# Patient Record
Sex: Female | Born: 1985 | Race: White | Hispanic: No | Marital: Married | Smoking: Former smoker
Health system: Southern US, Community
[De-identification: ages and names within clinical notes are randomized; demographics above are authoritative.]

## PROBLEM LIST (undated history)

## (undated) ENCOUNTER — Inpatient Hospital Stay (HOSPITAL_COMMUNITY): Payer: Self-pay

## (undated) DIAGNOSIS — R12 Heartburn: Secondary | ICD-10-CM

## (undated) DIAGNOSIS — Z8619 Personal history of other infectious and parasitic diseases: Secondary | ICD-10-CM

## (undated) DIAGNOSIS — E119 Type 2 diabetes mellitus without complications: Secondary | ICD-10-CM

## (undated) DIAGNOSIS — G43909 Migraine, unspecified, not intractable, without status migrainosus: Secondary | ICD-10-CM

## (undated) DIAGNOSIS — O26899 Other specified pregnancy related conditions, unspecified trimester: Secondary | ICD-10-CM

## (undated) DIAGNOSIS — B999 Unspecified infectious disease: Secondary | ICD-10-CM

## (undated) HISTORY — DX: Migraine, unspecified, not intractable, without status migrainosus: G43.909

## (undated) HISTORY — PX: DENTAL SURGERY: SHX609

## (undated) HISTORY — DX: Personal history of other infectious and parasitic diseases: Z86.19

---
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
## 2007-08-21 ENCOUNTER — Inpatient Hospital Stay (HOSPITAL_COMMUNITY): Admission: AD | Admit: 2007-08-21 | Discharge: 2007-08-21 | Payer: Self-pay | Admitting: Obstetrics & Gynecology

## 2007-08-29 ENCOUNTER — Emergency Department (HOSPITAL_COMMUNITY): Admission: EM | Admit: 2007-08-29 | Discharge: 2007-08-29 | Payer: Self-pay | Admitting: Emergency Medicine

## 2007-09-22 ENCOUNTER — Other Ambulatory Visit: Admission: RE | Admit: 2007-09-22 | Discharge: 2007-09-22 | Payer: Self-pay | Admitting: Obstetrics & Gynecology

## 2007-11-05 ENCOUNTER — Ambulatory Visit (HOSPITAL_COMMUNITY): Admission: RE | Admit: 2007-11-05 | Discharge: 2007-11-05 | Payer: Self-pay | Admitting: Obstetrics and Gynecology

## 2007-12-03 ENCOUNTER — Ambulatory Visit (HOSPITAL_COMMUNITY): Admission: RE | Admit: 2007-12-03 | Discharge: 2007-12-03 | Payer: Self-pay | Admitting: Obstetrics and Gynecology

## 2007-12-31 ENCOUNTER — Ambulatory Visit (HOSPITAL_COMMUNITY): Admission: RE | Admit: 2007-12-31 | Discharge: 2007-12-31 | Payer: Self-pay | Admitting: Obstetrics and Gynecology

## 2008-03-12 ENCOUNTER — Ambulatory Visit: Payer: Self-pay | Admitting: Obstetrics & Gynecology

## 2008-03-12 ENCOUNTER — Inpatient Hospital Stay (HOSPITAL_COMMUNITY): Admission: AD | Admit: 2008-03-12 | Discharge: 2008-03-12 | Payer: Self-pay | Admitting: Obstetrics & Gynecology

## 2008-03-18 ENCOUNTER — Observation Stay (HOSPITAL_COMMUNITY): Admission: AD | Admit: 2008-03-18 | Discharge: 2008-03-18 | Payer: Self-pay | Admitting: Obstetrics and Gynecology

## 2008-03-18 ENCOUNTER — Ambulatory Visit: Payer: Self-pay | Admitting: Obstetrics & Gynecology

## 2008-04-01 ENCOUNTER — Inpatient Hospital Stay (HOSPITAL_COMMUNITY): Admission: RE | Admit: 2008-04-01 | Discharge: 2008-04-03 | Payer: Self-pay | Admitting: Obstetrics and Gynecology

## 2008-04-01 ENCOUNTER — Encounter: Payer: Self-pay | Admitting: Obstetrics & Gynecology

## 2008-05-19 ENCOUNTER — Emergency Department (HOSPITAL_COMMUNITY): Admission: EM | Admit: 2008-05-19 | Discharge: 2008-05-19 | Payer: Self-pay | Admitting: Emergency Medicine

## 2008-11-08 ENCOUNTER — Other Ambulatory Visit: Admission: RE | Admit: 2008-11-08 | Discharge: 2008-11-08 | Payer: Self-pay | Admitting: Obstetrics and Gynecology

## 2009-06-10 ENCOUNTER — Emergency Department (HOSPITAL_COMMUNITY): Admission: EM | Admit: 2009-06-10 | Discharge: 2009-06-10 | Payer: Self-pay | Admitting: Emergency Medicine

## 2010-02-21 ENCOUNTER — Encounter: Payer: Self-pay | Admitting: Endocrinology

## 2010-02-22 ENCOUNTER — Encounter: Payer: Self-pay | Admitting: Endocrinology

## 2010-05-26 ENCOUNTER — Encounter: Payer: Self-pay | Admitting: Endocrinology

## 2010-08-16 ENCOUNTER — Emergency Department (HOSPITAL_COMMUNITY): Admission: EM | Admit: 2010-08-16 | Discharge: 2010-08-16 | Payer: Self-pay | Admitting: Emergency Medicine

## 2010-08-29 ENCOUNTER — Encounter: Payer: Self-pay | Admitting: Endocrinology

## 2010-09-05 ENCOUNTER — Encounter: Payer: Self-pay | Admitting: Endocrinology

## 2010-09-07 ENCOUNTER — Ambulatory Visit: Payer: Self-pay | Admitting: Endocrinology

## 2010-09-07 DIAGNOSIS — E109 Type 1 diabetes mellitus without complications: Secondary | ICD-10-CM | POA: Insufficient documentation

## 2010-10-05 ENCOUNTER — Ambulatory Visit: Payer: Self-pay | Admitting: Endocrinology

## 2010-11-21 NOTE — Letter (Signed)
Summary: [facility] [place]   Imported By: Lester Moniteau 09/12/2010 10:16:21  _____________________________________________________________________  External Attachment:    Type:   Image     Comment:   External Document

## 2010-11-21 NOTE — Assessment & Plan Note (Signed)
Summary: NEW ENDO MEDICAID CAR ACC 11% A1C - POOR CONTR DIAB PT-PER JA...   Vital Signs:  Patient profile:   26 year old female Height:      64 inches (162.56 cm) Weight:      167.50 pounds (76.14 kg) BMI:     28.86 O2 Sat:      98 % on Room air Temp:     98.5 degrees F (36.94 degrees C) oral Pulse rate:   84 / minute BP sitting:   110 / 70  (left arm) Cuff size:   regular  Vitals Entered By: Brenton Grills CMA Duncan Dull) (September 07, 2010 11:17 AM)  O2 Flow:  Room air CC: New Endo Consult/Uncontrolled DM/aj Is Patient Diabetic? Yes   Referring Provider:  Jackalyn Lombard MD Primary Provider:  Jackalyn Lombard MD  CC:  New Endo Consult/Uncontrolled DM/aj.  History of Present Illness: gdm was first noted during 2009 pregnancy.  she did not take any rx for the dm during the pregnancy, and neonate was 9 lbs 11 oz.  dm did not resolve after delivery.  she is unaware of any chronic complications.  she has never been on insulin.  she takes no medication now, but is due to staft metformin 500 mg qd, and glipizide at an uncertain dosage.  no cbg record, but states cbg's are 200-400. pt says his diet and exercise are "not good."   symptomatically, pt states she has few years of intermittent moderate dizziness sensation in the head.  no assoc loc. she is not considering another pregnancy.     Current Medications (verified): 1)  None  Allergies (verified): No Known Drug Allergies  Past History:  Past Medical History: IDDM (ICD-250.01) FAMILY HISTORY DIABETES 1ST DEGREE RELATIVE (ICD-V18.0)  Family History: Reviewed history and no changes required. Family History of Arthritis Family History Diabetes--both parents Family History High cholesterol (Parent) Family History Hypertension (Parents) Family History of Heart Disease (Grandparent) Family History Ovarian cancer (Parents, Other Blood Relative)  Social History: Reviewed history and no changes required. Married 1  child works at [employer] Former Smoker Alcohol use-no Drug use-no Regular exercise-no Smoking Status:  quit Drug Use:  no Does Patient Exercise:  no Seat Belt Use:  yes  Review of Systems       The patient complains of headaches.         denies weight loss, blurry vision, sob, vomiting, cramps, memory loss, depression, hypoglycemia, rhinorrhea, and easy bruising.  chest pain has been attributed to gerd.  she has polyuria, and excessive diaphoresis.   Physical Exam  General:  normal appearance.   Head:  head: no deformity eyes: no periorbital swelling, no proptosis external nose and ears are normal mouth: no lesion seen Neck:  Supple without thyroid enlargement or tenderness.  Lungs:  Clear to auscultation bilaterally. Normal respiratory effort.  Heart:  Regular rate and rhythm without murmurs or gallops noted. Normal S1,S2.   Abdomen:  abdomen is soft, nontender.  no hepatosplenomegaly.   not distended.  no hernia  Msk:  muscle bulk and strength are grossly normal.  no obvious joint swelling.  gait is normal and steady  Pulses:  dorsalis pedis intact bilat.  no carotid bruit  Extremities:  no deformity.  no ulcer on the feet.  feet are of normal color and temp.  no edema  Neurologic:  cn 2-12 grossly intact.   readily moves all 4's.   sensation is intact to touch on the feet  Skin:  normal texture and temp.  no rash.  not diaphoretic  Cervical Nodes:  No significant adenopathy.  Psych:  Alert and cooperative; normal mood and affect; normal attention span and concentration.   Additional Exam:  outside test results are reviewed:  a1c=11.8%  urine microalbumin is pos   Impression & Recommendations:  Problem # 1:  IDDM (ICD-250.01) needs increased rx  Problem # 2:  dizziness this can limit exercise rx  Problem # 3:  contraception it is important that pt continue this in view of her elev a1c  Medications Added to Medication List This Visit: 1)   Depo-provera 150 Mg/ml Susp (Medroxyprogesterone acetate) .... Q 3 months  Other Orders: New Patient Level IV (16109)  Patient Instructions: 1)  good diet and exercise habits significanly improve the control of your diabetes.  please let me know if you wish to be referred to a dietician.  high blood sugar is very risky to your health.  you should see an eye doctor every year. 2)  controlling your blood pressure and cholesterol drastically reduces the damage diabetes does to your body.  this also applies to quitting smoking.  please discuss these with your doctor.   3)  check your blood sugar 2 times a day.  vary the time of day when you check, between before the 3 meals, and at bedtime.  also check if you have symptoms of your blood sugar being too high or too low.  please keep a record of the readings and bring it to your next appointment here.  please call us sooner if you are having low blood sugar episodes. 4)  you should not have another pregnancy until advised it is safe.   5)  we will need to take this complex situation in stages. 6)  start the 2 diabetes medications as prescribed.  call in 4 days if blood sugar does not improve to the low-100's, and i will add another medication.   7)  Please schedule a follow-up appointment in 4 weeks.   Orders Added: 1)  New Patient Level IV [60454]

## 2011-01-03 LAB — DIFFERENTIAL
Basophils Absolute: 0 10*3/uL (ref 0.0–0.1)
Eosinophils Absolute: 0 10*3/uL (ref 0.0–0.7)
Eosinophils Relative: 0 % (ref 0–5)
Lymphocytes Relative: 40 % (ref 12–46)
Lymphs Abs: 3.5 10*3/uL (ref 0.7–4.0)
Monocytes Absolute: 0.7 10*3/uL (ref 0.1–1.0)

## 2011-01-03 LAB — COMPREHENSIVE METABOLIC PANEL
ALT: 20 U/L (ref 0–35)
AST: 17 U/L (ref 0–37)
Albumin: 4 g/dL (ref 3.5–5.2)
CO2: 23 mEq/L (ref 19–32)
Chloride: 103 mEq/L (ref 96–112)
GFR calc Af Amer: >60 mL/min (ref >60)
GFR calc non Af Amer: >60 mL/min (ref >60)
Sodium: 136 mEq/L (ref 135–145)
Total Bilirubin: 0.2 mg/dL — ABNORMAL LOW (ref 0.3–1.2)

## 2011-01-03 LAB — CBC
HCT: 39.9 % (ref 36.0–46.0)
Hemoglobin: 13.8 g/dL (ref 12.0–15.0)
MCH: 29.9 pg (ref 26.0–34.0)
MCV: 86.8 fL (ref 78.0–100.0)
RBC: 4.6 MIL/uL (ref 3.87–5.11)
WBC: 8.9 10*3/uL (ref 4.0–10.5)

## 2011-01-03 LAB — CK TOTAL AND CKMB (NOT AT ARMC): CK, MB: 0.4 ng/mL (ref 0.3–4.0)

## 2011-01-03 LAB — D-DIMER, QUANTITATIVE: D-Dimer, Quant: 1.13 ug/mL-FEU — ABNORMAL HIGH (ref 0.00–0.48)

## 2011-01-17 ENCOUNTER — Emergency Department (HOSPITAL_COMMUNITY)
Admission: EM | Admit: 2011-01-17 | Discharge: 2011-01-17 | Disposition: A | Discharge status: Home / Self Care | Payer: Medicaid Other | Attending: Emergency Medicine | Admitting: Emergency Medicine

## 2011-01-17 ENCOUNTER — Emergency Department (HOSPITAL_COMMUNITY): Payer: Medicaid Other

## 2011-01-17 DIAGNOSIS — N898 Other specified noninflammatory disorders of vagina: Secondary | ICD-10-CM | POA: Insufficient documentation

## 2011-01-17 DIAGNOSIS — E119 Type 2 diabetes mellitus without complications: Secondary | ICD-10-CM | POA: Insufficient documentation

## 2011-01-17 DIAGNOSIS — R10819 Abdominal tenderness, unspecified site: Secondary | ICD-10-CM | POA: Insufficient documentation

## 2011-01-17 DIAGNOSIS — R109 Unspecified abdominal pain: Secondary | ICD-10-CM | POA: Insufficient documentation

## 2011-01-17 DIAGNOSIS — N949 Unspecified condition associated with female genital organs and menstrual cycle: Secondary | ICD-10-CM | POA: Insufficient documentation

## 2011-01-17 LAB — URINALYSIS, ROUTINE W REFLEX MICROSCOPIC
Glucose, UA: >1000 mg/dL — AB
Leukocytes, UA: NEGATIVE
Protein, ur: 30 mg/dL — AB
pH: 5 (ref 5.0–8.0)

## 2011-01-17 LAB — URINE MICROSCOPIC-ADD ON

## 2011-01-17 LAB — WET PREP, GENITAL
Trich, Wet Prep: NONE SEEN
Yeast Wet Prep HPF POC: NONE SEEN

## 2011-03-06 NOTE — Op Note (Signed)
NAMEMarland [name]  KATHREN, SCEARCE              ACCOUNT NO.:  [id number]   MEDICAL RECORD NO.:  [id number]          PATIENT TYPE:  INP   LOCATION:  9103                          FACILITY:  [facility]   PHYSICIAN:  Lazaro Arms, M.D.   DATE OF BIRTH:  06/05/1986   DATE OF PROCEDURE:  04/01/2008  DATE OF DISCHARGE:                               OPERATIVE REPORT   PREOPERATIVE DIAGNOSES:  1. Intrauterine pregnancy at [redacted] weeks gestation.  2. Class A2 diabetes, on glyburide.  3. Estimated fetal weight of 4500 g.   POSTOPERATIVE DIAGNOSES:  1. Intrauterine pregnancy at [redacted] weeks gestation.  2. Class A2 diabetes, on glyburide.  3. Estimated fetal weight of 4500 g.   PROCEDURE:  Primary low transverse cesarean section.   SURGEON:  Lazaro Arms, MD   ANESTHESIA:  Spinal.   FINDINGS:  The patient is a gestational diabetic, requiring glyburide  for control, and we did an ultrasound yesterday and estimated fetal  weight was 4500 g.  I talked with Dr. Emelda Fear.  They called me in  consultation and I told him that my feeling was a diabetic, estimated  4500 g and that vaginal delivery was not appropriate, especially in  nulliparous who has not proven any sort of weight because of the 20% to  25% risk of shoulder distortion in this risk category.  We relayed that  to the patient and the patient agreed to proceed with a primary cesarean  section.  At the time of C-section, she delivered a viable female at 09:17  with Apgars of 9 and 9, and weighing 9 pounds 11 ounces.  There was 3-  vessel cord.  Cord blood and cord gases were sent.  Placenta was normal  intact.  Cord pH was 7.24.  Uterus, tubes, and ovaries were normal.   DESCRIPTION OF PROCEDURE:  The patient was taken to the operating room  and placed in the sitting position, where she underwent a spinal  anesthetic.  She was then prepped and draped in usual sterile fashion.  A Foley catheter was placed.  A Pfannenstiel skin incision was made and  carried  down sharply into the rectus fascia, which was scored in midline  and extended laterally.  The fascia was taken off the muscles superiorly  and inferiorly without difficulty, the muscles were divided.  Peritoneal  cavity was entered.  A bladder blade was placed.  Vesicouterine space  was clamped and flap was created.  A low transverse cesarean section  incision was made over this incision and delivered a viable female infant  at 09:17 with Apgars of 9 and 9, weighing 9 pounds 11 ounces.  Cord  blood and cord gases were sent.  The infant underwent routine neonatal  resuscitation.  The placenta was delivered spontaneously.  The uterus  was exteriorized, wiped and cleaned with a plain lap pad.  The uterus  was closed in 2 layers, first being in running interlocking layer with  the second being imbricating layer.  There was good hemostasis.  The  uterus was replaced in peritoneal cavity and the peritoneal cavity was  irrigated.  The muscles and peritoneum were reapproximated loosely.  The  fascia was closed using 0 Vicryl running subcutaneous tissue was  made hemostatic and irrigated.  The skin was closed using skin staples.  The patient tolerated the procedure well.  She experienced 500 mL of  blood loss, taken to recovery room in good stable condition.  All counts  were correct.  She received cefoxitin prophylactically.      Lazaro Arms, M.D.  Electronically Signed     LHE/MEDQ  D:  04/01/2008  T:  04/02/2008  Job:  [id number]

## 2011-03-06 NOTE — H&P (Signed)
NAME:  Evelyn [name], Evelyn [name] NO.:  [id number]   MEDICAL RECORD NO.:  [id number]           PATIENT TYPE:   LOCATION:                                 FACILITY:   PHYSICIAN:  Tilda Burrow, M.D. DATE OF BIRTH:  05-31-1986   DATE OF ADMISSION:  DATE OF DISCHARGE:                              HISTORY & PHYSICAL   ADMISSION DIAGNOSES:  1. Pregnancy at 39 weeks, 2 days.  2. Gestational diabetes.  3. Fetal macrosomia.  Estimated fetal weight greater than 4500 grams.   HISTORY OF PRESENT ILLNESS:  This 25 year old gravida 1, para 0, LMP  July 03, 2007, placing menstrual [facility] April 07, 2008, with ultrasound  confirming [facility] in early pregnancy, is admitted at this time for primary  Cesarean section.  Pregnancy course has been notable for elevated  glucose tolerance test with 3 hour glucose tolerance test results of 92,  187, 201 and 149 on March 30.  Patient has been poorly compliant with  monitoring her blood sugars until recently.  She was admitted earlier in  the pregnancy at 4 cm dilated at 37 weeks, but did not progress into  labor.  Contractions were never quite strong.  She was placed on  Glyburide 2.5 mg p.o. b.i.d. and agreed in the future to go to t.i.d.  blood glucose monitoring, which she was only moderately compliant with.  Fortunately, fasting blood sugars tend to run from 70 to 103 with  afternoon and bedtime blood sugars ranging occasionally as high as 170.  There have been no blood sugars greater than 170.  Estimated fetal  weight on March 31, 2008 is greater than 4,500 grams.  Cervix was 4 cm 2  days ago.  Patient was counseled over the options and given the size of  the baby, the option of proceeding with primary Cesarean delivery was  discussed.  The patient has considered her options and does not wish to  risk attempted vaginal delivery.  She is therefore scheduled for primary  Cesarean section at 9:00 a.m. on April 01, 2008.   PAST MEDICAL HISTORY:   Benign.   SURGICAL HISTORY:  Negative.   ALLERGIES:  1. Medications:  None.  2. Latex:  None.   MEDICATIONS:  Prenatal vitamins, iron and Glyburide 2.5 mg p.o. b.i.d.   GENERAL EXAMINATION:  Height 5 feet 4 inches.  Weight 200 pounds.  Pupils equal, round, reactive.  NECK:  Supple.  CARDIOVASCULAR:  Exam unremarkable.  Fundal height 42 cm.  Estimated fetal weight 4,504 grams by ultrasound.   PLAN:  Primary Cesarean section at 9:00 a.m. April 01, 2008.      Tilda Burrow, M.D.  Electronically Signed     JVF/MEDQ  D:  03/31/2008  T:  03/31/2008  Job:  [id number]

## 2011-03-06 NOTE — Discharge Summary (Signed)
NAMEMarland Kitchen  Evelyn, [name]              ACCOUNT NO.:  [id number]   MEDICAL RECORD NO.:  [id number]          PATIENT TYPE:  INP   LOCATION:  9103                          [facility]ACILITY:  [facility]   PHYSICIAN:  Allie Bossier, MD        DATE OF BIRTH:  10-08-1986   DATE OF ADMISSION:  04/01/2008  DATE OF DISCHARGE:  04/03/2008                               DISCHARGE SUMMARY   ADMISSION DIAGNOSES:  1. Intrauterine pregnancy at 35 weeks' gestation.  2. Class A2 gestational diabetes.  3. Estimated fetal weight greater than 4500 grams on recent ultrasound  4. Indicated primary low-transverse cesarean section for estimated      fetal weight greater than 4500 grams on March 31, 2008.   DISCHARGE DIAGNOSES:  1. Postoperative day #2 from primary low-transverse cesarean section.  2. Gestational diabetes type A2, blood sugars under good control      without medication.   PROCEDURES:  1. Primary low-transverse cesarean section at 46 weeks' gestation for      estimated fetal weight greater than 4500 grams on recent      ultrasound.  Viable infant female weighing 9 pounds 11 ounces, Apgars      9 at 1 minute and 9 at 5 minutes.   COMPLICATIONS:  None.   CONSULTATIONS:  None.   BRIEF PERTINENT LABORATORY FINDINGS:  On admission, she had a complete  blood cell count with white blood cells 13.1, hemoglobin 9.3, hematocrit  27.6, and platelets 367,000.  Her metabolic panel shows sodium 133,  potassium 3.6, chloride 106, glucose 83, BUN 3, and creatinine 0.38.  Blood type O+, antibody negative, RPR was nonreactive.  On postoperative  day #1, CBC showed white blood cell count 11.4, hemoglobin 8.0,  hematocrit 23.6, and platelets 289,000.  On postoperative day #2, white  blood cell count was 10.6, hemoglobin was 7.9, hematocrit was 23.6, and  platelets were 320,000.   BRIEF PERTINENT ADMISSION HISTORY:  Evelyn [name] is a 25 year old gravida  1, para 0 at 3 weeks' gestation and class A2 diabetes on glyburide  presenting for primary low-transverse cesarean section.  It was  indicated because estimated fetal weight was greater than 4500 grams on  March 31, 2008.  The patient was taken for low-transverse cesarean  section by Dr. Duane Lope.   HOSPITAL COURSE:  The patient tolerated the cesarean section, delivered  a viable infant female weighing 9 pounds 11 ounces on April 01, 2008.  It  was uncomplicated operation and the patient did well postoperatively.  Her hemoglobin did trend down from initial 9.3 to 7.9 postoperative day  #2.  She was doing well with stable vitals, normal physical examination.  Her incision was healing well with staples in place.  She had no  symptoms of anemia such as dizziness, lightheadedness, elevated pulse,  or decreased blood pressure.  She was stooling and voiding without  difficulty.  She was ambulating well and was tolerating p.o.  She was in  stable condition for discharge.   DISCHARGE STATUS:  Stable.   DISCHARGE MEDICATIONS:  1. Continue prenatal vitamins 1 tablet daily.  2. Colace 100 mg 1 tablet by mouth twice daily.  3. Ferrous sulfate 325 mg 1 tablet by mouth twice daily.  4. Ibuprofen 600 mg 1 tablet by mouth every 6 hours as needed for      pain.  5. Percocet 5/325 1-2 tablets by mouth every 4-6 hours as needed for      pain.  6. The patient is to receive Depo-Provera x1 dose prior to discharge.   DISCHARGE INSTRUCTIONS:  1. Discharge to home.  The patient is going to room in as the baby is      in the NICU.  2. No sexual activity for 6 weeks.  3. No lifting greater than 10 pounds for 6 weeks.  4. Follow up had already scheduled appointment for staple removal at      [facility].  5. Follow up at her 4-week postpartum examination.      Evelyn Lemon, MD  Electronically Signed     ______________________________  Allie Bossier, MD    NS/MEDQ  D:  04/03/2008  T:  04/04/2008  Job:  [id number]

## 2011-06-07 ENCOUNTER — Other Ambulatory Visit: Payer: Self-pay | Admitting: Family Medicine

## 2011-06-07 ENCOUNTER — Ambulatory Visit
Admission: RE | Admit: 2011-06-07 | Discharge: 2011-06-07 | Disposition: A | Discharge status: Home / Self Care | Payer: Medicaid Other | Source: Ambulatory Visit | Attending: Family Medicine | Admitting: Family Medicine

## 2011-06-07 DIAGNOSIS — S60221A Contusion of right hand, initial encounter: Secondary | ICD-10-CM

## 2011-07-18 LAB — CBC
HCT: 28.1 — ABNORMAL LOW
MCHC: 34
MCV: 83.3
RBC: 3.38 — ABNORMAL LOW

## 2011-07-18 LAB — URINALYSIS, ROUTINE W REFLEX MICROSCOPIC
Bilirubin Urine: NEGATIVE
Nitrite: NEGATIVE
Protein, ur: NEGATIVE
Specific Gravity, Urine: 1.025
Urobilinogen, UA: 0.2

## 2011-07-18 LAB — URINE MICROSCOPIC-ADD ON

## 2011-07-18 LAB — RPR: RPR Ser Ql: NONREACTIVE

## 2011-07-19 LAB — TYPE AND SCREEN: Antibody Screen: NEGATIVE

## 2011-07-19 LAB — BASIC METABOLIC PANEL
BUN: 3 — ABNORMAL LOW
CO2: 20
GFR calc non Af Amer: >60
Glucose, Bld: 83
Potassium: 3.6

## 2011-07-19 LAB — CBC
HCT: 23.6 — ABNORMAL LOW
HCT: 27.6 — ABNORMAL LOW
Hemoglobin: 8 — ABNORMAL LOW
Hemoglobin: 9.3 — ABNORMAL LOW
MCHC: 33.7
MCHC: 33.9
MCHC: 34
MCV: 81.2
MCV: 81.8
MCV: 81.8
Platelets: 320
Platelets: 367
RBC: 2.88 — ABNORMAL LOW
RDW: 15.2
RDW: 15.3
RDW: 15.3

## 2011-08-01 LAB — URINALYSIS, ROUTINE W REFLEX MICROSCOPIC
Ketones, ur: NEGATIVE
Protein, ur: NEGATIVE
Urobilinogen, UA: 0.2

## 2011-08-01 LAB — GC/CHLAMYDIA PROBE AMP, GENITAL: Chlamydia, DNA Probe: NEGATIVE

## 2011-08-01 LAB — CBC
HCT: 37.6
Hemoglobin: 12.9
Platelets: 360
RDW: 13.6
WBC: 11.1 — ABNORMAL HIGH

## 2011-08-01 LAB — WET PREP, GENITAL

## 2011-08-01 LAB — POCT PREGNANCY, URINE: Preg Test, Ur: POSITIVE

## 2011-08-02 IMAGING — US US PELVIS COMPLETE
1 series · 14 of 25 positions shown · non-contrast
Comparison: None.

CLINICAL DATA: Left lower quadrant pain



[Series 1: us pelvis complete · 0.19mm/px · 14 of 63 slices shown]
[im 1/63]
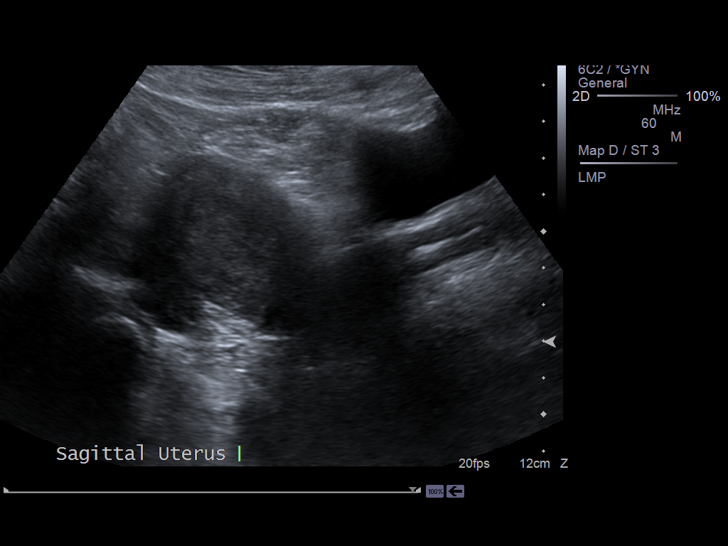
[im 6/63]
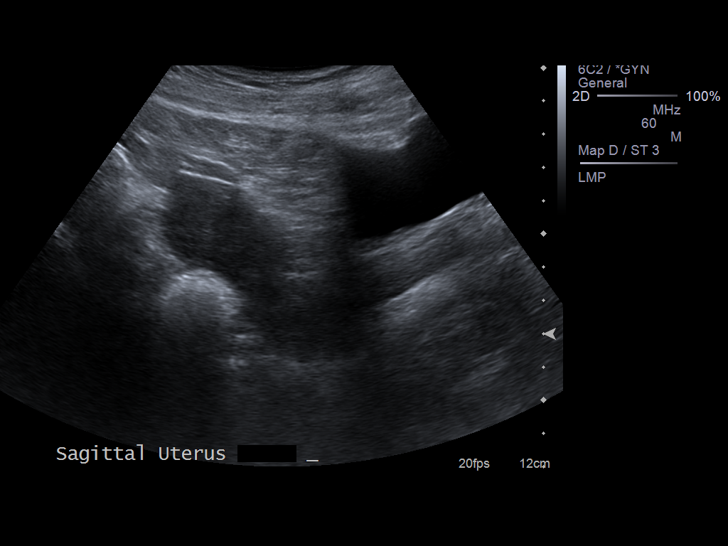
[im 11/63]
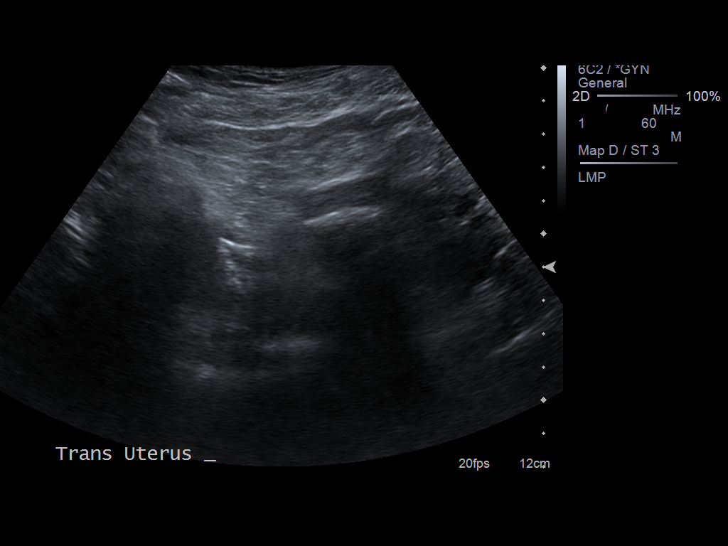
[im 16/63]
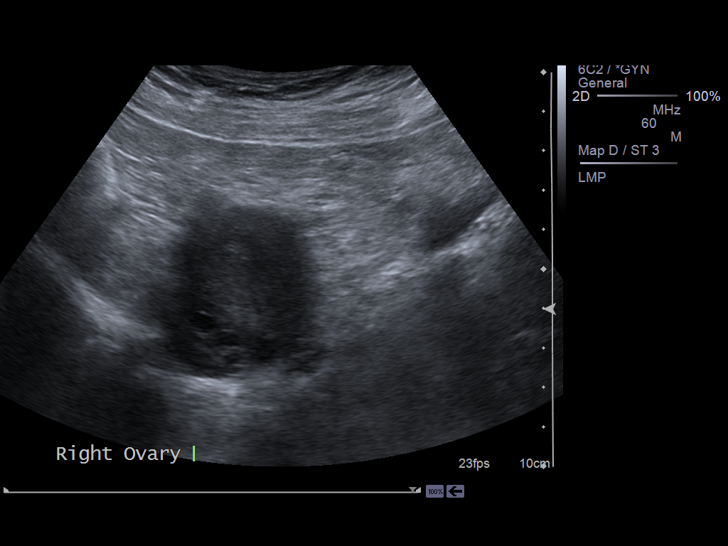
[im 21/63]
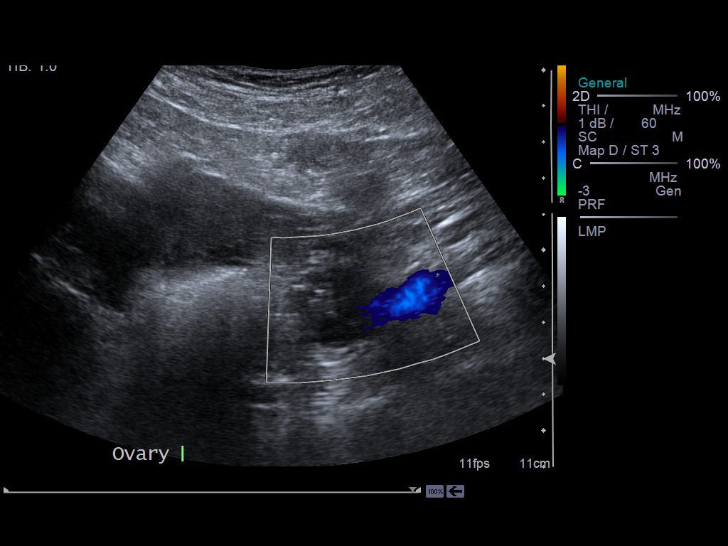
[im 24/63]
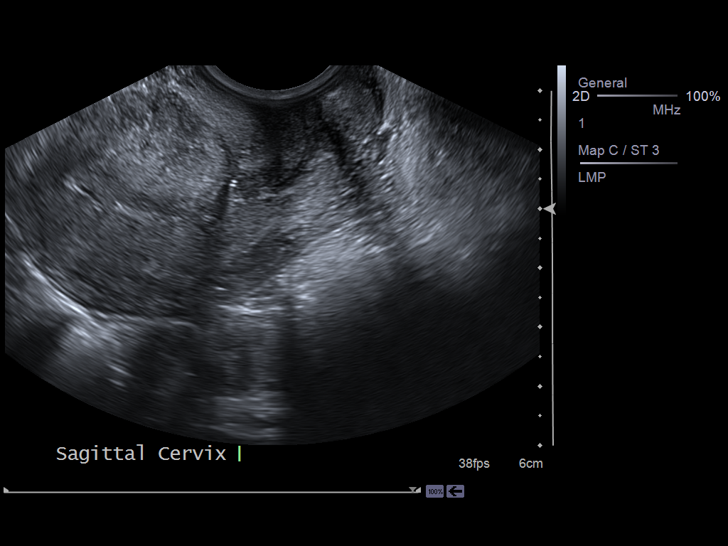
[im 29/63]
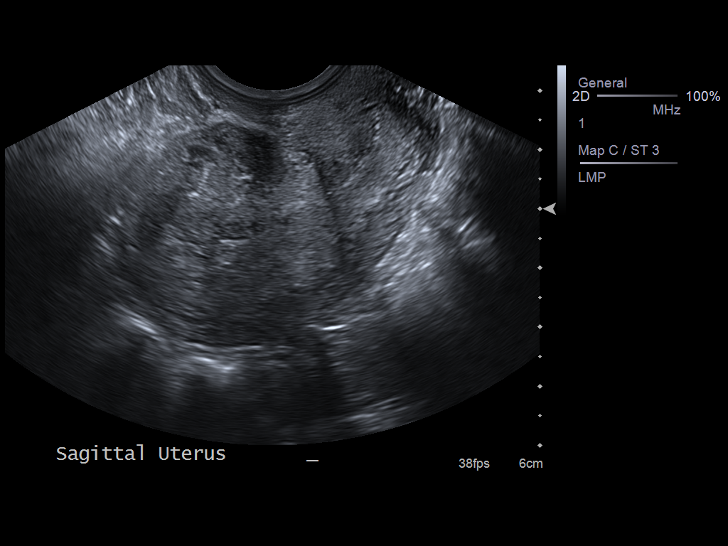
[im 34/63]
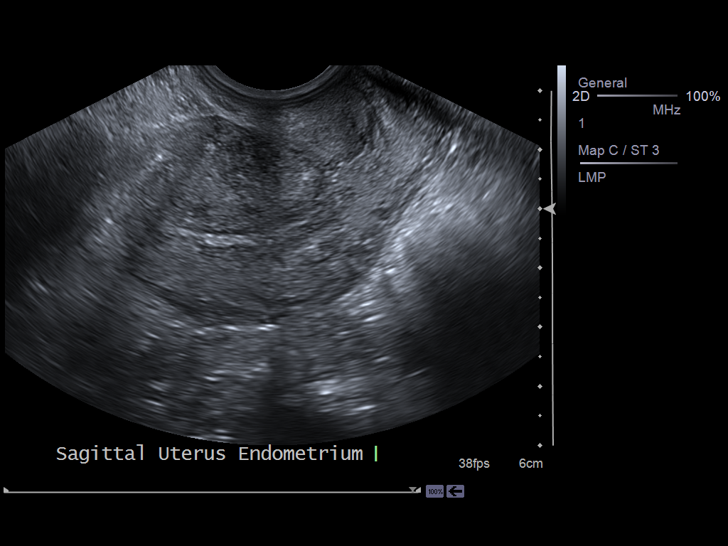
[im 39/63]
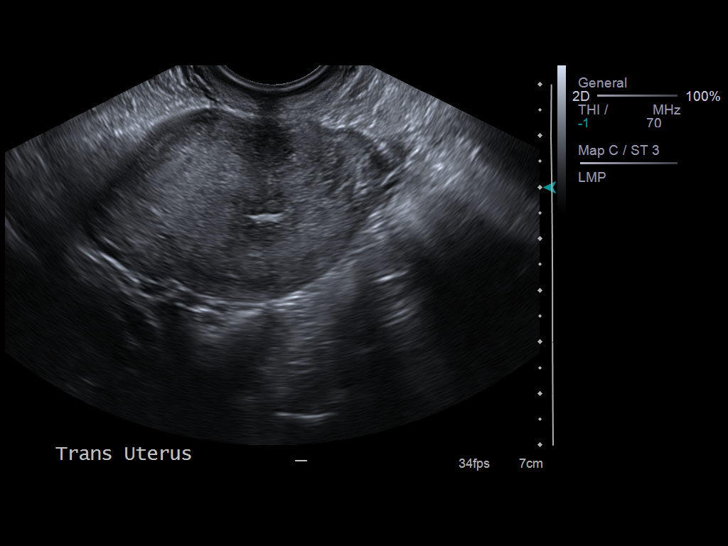
[im 42/63]
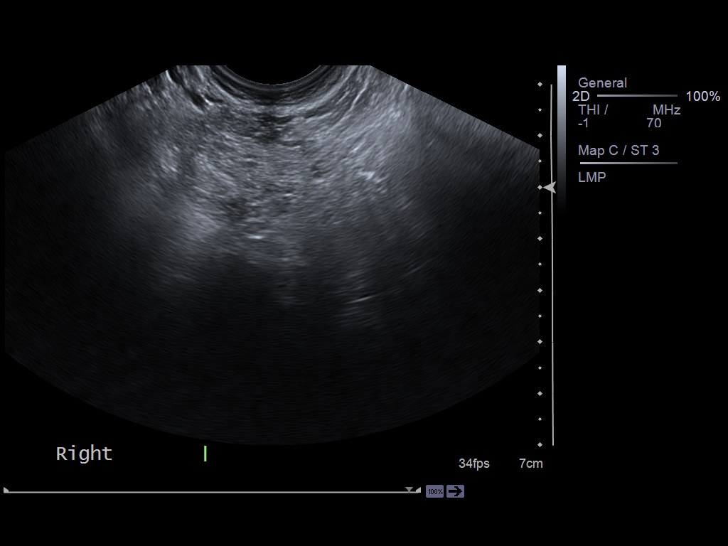
[im 47/63]
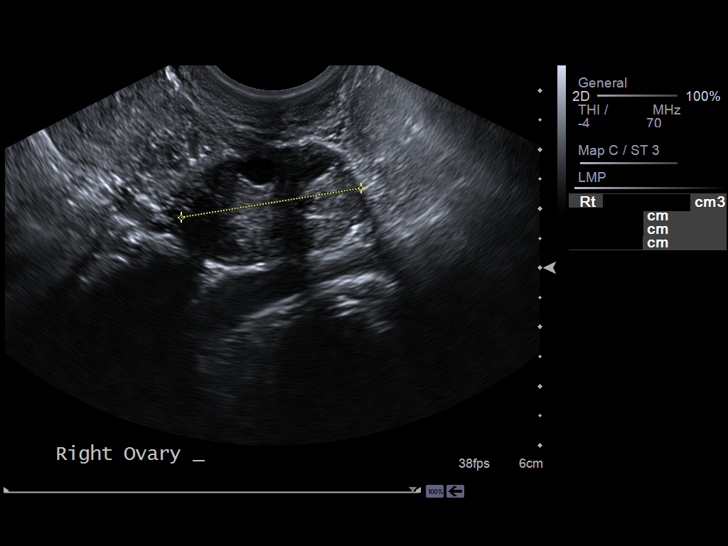
[im 52/63]
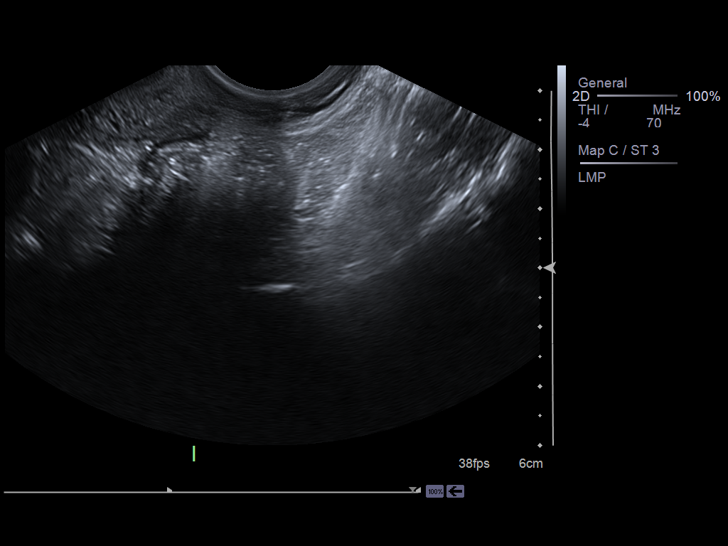
[im 57/63]
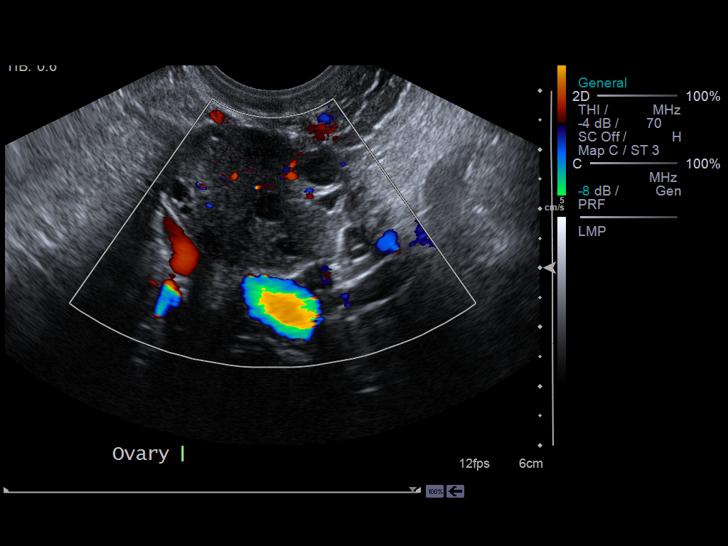
[im 63/63]
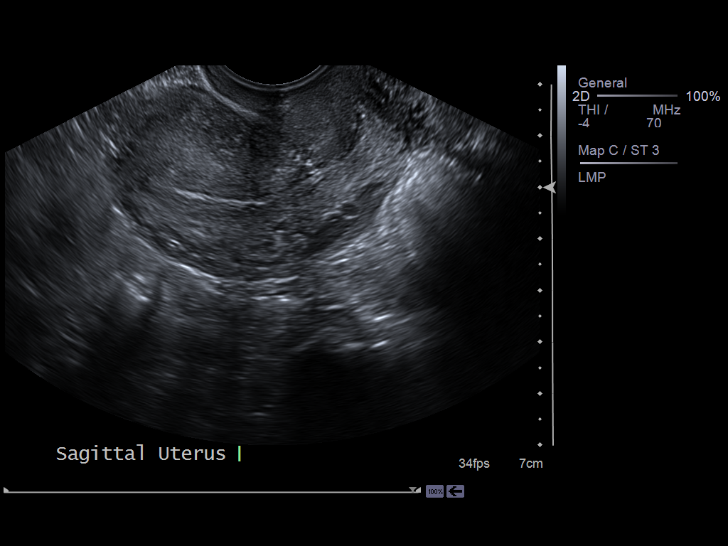

[14 of 25 positions shown; findings below may reference images not displayed]

FINDINGS: Uterus:  5.1 x 3.7 x 4.6 cm.  No fibroids or other uterine masses
identified.

Endometrium:  3 mm in thickness and uniform.

Right ovary:  3.8 x 1.8 x 3.1 cm.  Normal appearance/no adnexal
mass.

Left ovary:  2.7 x 2.7 x 3.0 cm.  Normal appearance/no adnexal
mass.

Other findings:  No free fluid.
IMPRESSION: Within normal limits.

## 2012-05-02 ENCOUNTER — Emergency Department (HOSPITAL_COMMUNITY)
Admission: EM | Admit: 2012-05-02 | Discharge: 2012-05-02 | Disposition: A | Discharge status: Home / Self Care | Payer: Medicaid Other | Attending: Emergency Medicine | Admitting: Emergency Medicine

## 2012-05-02 ENCOUNTER — Encounter (HOSPITAL_COMMUNITY): Payer: Self-pay

## 2012-05-02 ENCOUNTER — Emergency Department (HOSPITAL_COMMUNITY): Payer: Medicaid Other

## 2012-05-02 DIAGNOSIS — F17200 Nicotine dependence, unspecified, uncomplicated: Secondary | ICD-10-CM | POA: Insufficient documentation

## 2012-05-02 DIAGNOSIS — N949 Unspecified condition associated with female genital organs and menstrual cycle: Secondary | ICD-10-CM | POA: Insufficient documentation

## 2012-05-02 DIAGNOSIS — A499 Bacterial infection, unspecified: Secondary | ICD-10-CM | POA: Insufficient documentation

## 2012-05-02 DIAGNOSIS — N760 Acute vaginitis: Secondary | ICD-10-CM | POA: Insufficient documentation

## 2012-05-02 DIAGNOSIS — R110 Nausea: Secondary | ICD-10-CM | POA: Insufficient documentation

## 2012-05-02 DIAGNOSIS — R1310 Dysphagia, unspecified: Secondary | ICD-10-CM | POA: Insufficient documentation

## 2012-05-02 DIAGNOSIS — R109 Unspecified abdominal pain: Secondary | ICD-10-CM

## 2012-05-02 DIAGNOSIS — R739 Hyperglycemia, unspecified: Secondary | ICD-10-CM

## 2012-05-02 DIAGNOSIS — R10819 Abdominal tenderness, unspecified site: Secondary | ICD-10-CM | POA: Insufficient documentation

## 2012-05-02 DIAGNOSIS — B9689 Other specified bacterial agents as the cause of diseases classified elsewhere: Secondary | ICD-10-CM | POA: Insufficient documentation

## 2012-05-02 LAB — COMPREHENSIVE METABOLIC PANEL
Alkaline Phosphatase: 78 U/L (ref 39–117)
BUN: 11 mg/dL (ref 6–23)
Chloride: 98 mEq/L (ref 96–112)
Creatinine, Ser: 0.48 mg/dL — ABNORMAL LOW (ref 0.50–1.10)
GFR calc Af Amer: >90 mL/min (ref >90)
GFR calc non Af Amer: >90 mL/min (ref >90)
Glucose, Bld: 274 mg/dL — ABNORMAL HIGH (ref 70–99)
Potassium: 3.9 mEq/L (ref 3.5–5.1)
Total Bilirubin: 0.2 mg/dL — ABNORMAL LOW (ref 0.3–1.2)

## 2012-05-02 LAB — LIPASE, BLOOD: Lipase: 20 U/L (ref 11–59)

## 2012-05-02 LAB — CBC WITH DIFFERENTIAL/PLATELET
HCT: 38.5 % (ref 36.0–46.0)
Hemoglobin: 13.1 g/dL (ref 12.0–15.0)
Lymphs Abs: 3.4 10*3/uL (ref 0.7–4.0)
MCH: 29.7 pg (ref 26.0–34.0)
MCHC: 34 g/dL (ref 30.0–36.0)
Monocytes Absolute: 0.6 10*3/uL (ref 0.1–1.0)
Monocytes Relative: 6 % (ref 3–12)
Neutro Abs: 5.2 10*3/uL (ref 1.7–7.7)
Neutrophils Relative %: 56 % (ref 43–77)
RBC: 4.41 MIL/uL (ref 3.87–5.11)

## 2012-05-02 LAB — URINALYSIS, ROUTINE W REFLEX MICROSCOPIC
Hgb urine dipstick: NEGATIVE
Leukocytes, UA: NEGATIVE
Protein, ur: NEGATIVE mg/dL
Specific Gravity, Urine: 1.015 (ref 1.005–1.030)
Urobilinogen, UA: 0.2 mg/dL (ref 0.0–1.0)

## 2012-05-02 LAB — POCT PREGNANCY, URINE: Preg Test, Ur: NEGATIVE

## 2012-05-02 LAB — URINE MICROSCOPIC-ADD ON

## 2012-05-02 LAB — WET PREP, GENITAL: Yeast Wet Prep HPF POC: NONE SEEN

## 2012-05-02 LAB — HCG, QUANTITATIVE, PREGNANCY: hCG, Beta Chain, Quant, S: <1 m[IU]/mL (ref <5)

## 2012-05-02 MED ORDER — PROMETHAZINE HCL 25 MG PO TABS: 25.0000 mg | ORAL_TABLET | Freq: Four times a day (QID) | ORAL | Status: DC | PRN

## 2012-05-02 MED ORDER — IOHEXOL 300 MG/ML  SOLN
100.0000 mL | Freq: Once | INTRAMUSCULAR | Status: AC | PRN
  Administered 2012-05-02: 100 mL via INTRAVENOUS

## 2012-05-02 MED ORDER — HYDROCODONE-ACETAMINOPHEN 5-325 MG PO TABS: ORAL_TABLET | ORAL | Status: AC

## 2012-05-02 MED ORDER — IOHEXOL 300 MG/ML  SOLN
40.0000 mL | Freq: Once | INTRAMUSCULAR | Status: AC | PRN
  Administered 2012-05-02: 40 mL via ORAL

## 2012-05-02 MED ORDER — METRONIDAZOLE 500 MG PO TABS: 500.0000 mg | ORAL_TABLET | Freq: Two times a day (BID) | ORAL | Status: AC

## 2012-05-02 MED ORDER — ONDANSETRON HCL 4 MG/2ML IJ SOLN
4.0000 mg | Freq: Once | INTRAMUSCULAR | Status: AC
  Administered 2012-05-02: 4 mg via INTRAVENOUS
  Filled 2012-05-02: qty 2

## 2012-05-02 MED ORDER — KETOROLAC TROMETHAMINE 30 MG/ML IJ SOLN
30.0000 mg | Freq: Once | INTRAMUSCULAR | Status: AC
  Administered 2012-05-02: 30 mg via INTRAVENOUS
  Filled 2012-05-02: qty 1

## 2012-05-02 NOTE — ED Notes (Signed)
Pt in CT.

## 2012-05-02 NOTE — ED Provider Notes (Signed)
History     CSN: [id number]  Arrival date & time 05/02/12  1026   First MD Initiated Contact with Patient 05/02/12 1045      Chief Complaint  Patient presents with  . Abdominal Pain    (Consider location/radiation/quality/duration/timing/severity/associated sxs/prior treatment) Patient is a 26 y.o. female presenting with abdominal pain. The history is provided by the patient.  Abdominal Pain The primary symptoms of the illness include abdominal pain and nausea. The primary symptoms of the illness do not include fever, fatigue, shortness of breath, vomiting, diarrhea, hematemesis, hematochezia, dysuria, vaginal discharge or vaginal bleeding. The current episode started more than 2 days ago. The onset of the illness was gradual. The problem has not changed since onset. The abdominal pain began more than 2 days ago. Progression: waxing and waning. The abdominal pain is located in the suprapubic region (lower abdomen). The abdominal pain does not radiate. The abdominal pain is relieved by nothing. Exacerbated by: nothing.  The patient states that she believes she is currently not pregnant. The patient has not had a change in bowel habit. Symptoms associated with the illness do not include chills, hematuria or back pain. Significant associated medical issues do not include PUD, GERD, inflammatory bowel disease, diabetes, liver disease, substance abuse, diverticulitis or cardiac disease.    History reviewed. No pertinent past medical history.  Past Surgical History  Procedure Date  . Cesarean section     No family history on file.  History  Substance Use Topics  . Smoking status: Current Some Day Smoker  . Smokeless tobacco: Not on file  . Alcohol Use: No    OB History    Grav Para Term Preterm Abortions TAB SAB Ect Mult Living                  Review of Systems  Constitutional: Negative for fever, chills and fatigue.  HENT: Positive for trouble swallowing.   Respiratory:  Negative for cough, shortness of breath and wheezing.   Cardiovascular: Negative for chest pain and palpitations.  Gastrointestinal: Positive for nausea and abdominal pain. Negative for vomiting, diarrhea, blood in stool, hematochezia, abdominal distention, anal bleeding and hematemesis.  Genitourinary: Positive for pelvic pain. Negative for dysuria, hematuria, flank pain, vaginal bleeding, vaginal discharge and menstrual problem.  Musculoskeletal: Negative for myalgias, back pain and arthralgias.  Skin: Negative for rash.  Neurological: Negative for dizziness, weakness and numbness.  Hematological: Does not bruise/bleed easily.  All other systems reviewed and are negative.    Allergies  Review of patient's allergies indicates no known allergies.  Home Medications   Current Outpatient Rx  Name Route Sig Dispense Refill  . ACETAMINOPHEN 500 MG PO TABS Oral Take 1,000 mg by mouth every 6 (six) hours as needed. For pain      BP 106/72  Pulse 90  Temp 98.2 F (36.8 C) (Oral)  Resp 18  Ht 5\' 4"  (1.626 m)  Wt 165 lb (74.844 kg)  BMI 28.32 kg/m2  SpO2 100%  LMP 04/06/2012  Physical Exam  Nursing note and vitals reviewed. Constitutional: She is oriented to person, place, and time. She appears well-developed and well-nourished. No distress.  HENT:  Head: Normocephalic and atraumatic.  Mouth/Throat: Oropharynx is clear and moist.  Cardiovascular: Normal rate, regular rhythm, normal heart sounds and intact distal pulses.   No murmur heard. Pulmonary/Chest: Effort normal and breath sounds normal.  Abdominal: Soft. Bowel sounds are normal. She exhibits no distension and no mass. There is no hepatosplenomegaly. There  is tenderness in the right lower quadrant and left lower quadrant. There is no rigidity, no rebound, no guarding, no CVA tenderness and no tenderness at McBurney's point.    Genitourinary: Uterus normal. Uterus is not enlarged and not tender. Cervix exhibits no motion  tenderness, no discharge and no friability. Right adnexum displays no mass and no tenderness. Left adnexum displays no mass and no tenderness. No erythema, tenderness or bleeding around the vagina. No foreign body around the vagina. Vaginal discharge found.  Musculoskeletal: She exhibits no edema.  Neurological: She is alert and oriented to person, place, and time.  Skin: Skin is warm and dry.    ED Course  Procedures (including critical care time)  Labs Reviewed  URINALYSIS, ROUTINE W REFLEX MICROSCOPIC - Abnormal; Notable for the following:    Glucose, UA >1000 (*)     All other components within normal limits  COMPREHENSIVE METABOLIC PANEL - Abnormal; Notable for the following:    Sodium 133 (*)     Glucose, Bld 274 (*)     Creatinine, Ser 0.48 (*)     Total Bilirubin 0.2 (*)     All other components within normal limits  WET PREP, GENITAL - Abnormal; Notable for the following:    Clue Cells Wet Prep HPF POC MANY (*)     WBC, Wet Prep HPF POC MANY (*)     All other components within normal limits  POCT PREGNANCY, URINE  CBC WITH DIFFERENTIAL  LIPASE, BLOOD  URINE MICROSCOPIC-ADD ON  HCG, QUANTITATIVE, PREGNANCY  GC/CHLAMYDIA PROBE AMP, GENITAL   Ct Abdomen Pelvis W Contrast  05/02/2012  *RADIOLOGY REPORT*  Clinical Data: Abdominal pain  CT ABDOMEN AND PELVIS WITH CONTRAST  Technique:  Multidetector CT imaging of the abdomen and pelvis was performed following the standard protocol during bolus administration of intravenous contrast.  Contrast: OMNIPAQUE IOHEXOL 300 MG/ML  SOLN  Comparison: None.  Findings: No focal abnormalities seen in the liver or spleen.  The stomach, duodenum, pancreas, gallbladder, and adrenal glands have normal imaging features.  Kidneys are unremarkable.  No abdominal aortic aneurysm.  There is no free fluid or lymphadenopathy in the abdomen.  Abdominal bowel loops have normal appearance.  Imaging through the pelvis shows no free intraperitoneal fluid.  There is no pelvic sidewall lymphadenopathy.  Uterus is unremarkable.  No adnexal mass.  Bladder is decompressed.  Terminal ileum and appendix are unremarkable.  Bone windows reveal no worrisome lytic or sclerotic osseous lesions.  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  IMPRESSION: No acute findings in the abdomen or pelvis.  No evidence to explain the patient's history of pain.  Original Report Authenticated By: ERIC A. MANSELL, M.D.   GC and Chlamydia cultures pending.     MDM   Pt has hx of DM, currently not taking medications.  Has appt with her PMD next week regarding her diabetes.  States she is going to start back to taking metformin.  No clinical signs of DKA.  Vitals stable.  Patient is feeling better.  Agrees to close f/u with her PMD next week or to return here if the sx's worsen.   The patient appears reasonably screened and/or stabilized for discharge and I doubt any other medical condition or other [facility] requiring further screening, evaluation, or treatment in the ED at this time prior to discharge.    .Patient / Family / Caregiver understand and agree with initial ED impression and plan with expectations set for  ED visit. Pt stable in ED with no significant deterioration in condition. Pt feels improved after observation and/or treatment in ED.    i will treat for BV with flagyl, norco and phenergan for nausea..       Aeriana Speece L. [place], [place] 05/05/12 1733

## 2012-05-02 NOTE — ED Notes (Signed)
Pt reports lower abd cramping and nausea for the past  1 1/2-2 weeks.  Denies any abnormal vaginal bleeding or discharge, denies vomiting or diarrhea.

## 2012-05-06 NOTE — ED Provider Notes (Signed)
Medical screening examination/treatment/procedure(s) were performed by non-physician practitioner and as supervising physician I was immediately available for consultation/collaboration.   Carleene Cooper III, MD 05/06/12 1332

## 2012-12-06 ENCOUNTER — Inpatient Hospital Stay (HOSPITAL_COMMUNITY)
Admission: AD | Admit: 2012-12-06 | Discharge: 2012-12-07 | Disposition: A | Discharge status: Home / Self Care | Payer: Medicaid Other | Source: Ambulatory Visit | Attending: Obstetrics & Gynecology | Admitting: Obstetrics & Gynecology

## 2012-12-06 ENCOUNTER — Encounter (HOSPITAL_COMMUNITY): Payer: Self-pay | Admitting: *Deleted

## 2012-12-06 DIAGNOSIS — A599 Trichomoniasis, unspecified: Secondary | ICD-10-CM

## 2012-12-06 DIAGNOSIS — IMO0002 Reserved for concepts with insufficient information to code with codable children: Secondary | ICD-10-CM | POA: Insufficient documentation

## 2012-12-06 DIAGNOSIS — A5901 Trichomonal vulvovaginitis: Secondary | ICD-10-CM | POA: Insufficient documentation

## 2012-12-06 DIAGNOSIS — E1165 Type 2 diabetes mellitus with hyperglycemia: Secondary | ICD-10-CM | POA: Insufficient documentation

## 2012-12-06 DIAGNOSIS — O98819 Other maternal infectious and parasitic diseases complicating pregnancy, unspecified trimester: Secondary | ICD-10-CM | POA: Insufficient documentation

## 2012-12-06 DIAGNOSIS — O24919 Unspecified diabetes mellitus in pregnancy, unspecified trimester: Secondary | ICD-10-CM | POA: Insufficient documentation

## 2012-12-06 DIAGNOSIS — E119 Type 2 diabetes mellitus without complications: Secondary | ICD-10-CM

## 2012-12-06 DIAGNOSIS — N949 Unspecified condition associated with female genital organs and menstrual cycle: Secondary | ICD-10-CM | POA: Insufficient documentation

## 2012-12-06 HISTORY — DX: Type 2 diabetes mellitus without complications: E11.9

## 2012-12-06 LAB — URINALYSIS, ROUTINE W REFLEX MICROSCOPIC
Bilirubin Urine: NEGATIVE
Nitrite: NEGATIVE
Specific Gravity, Urine: 1.015 (ref 1.005–1.030)
Urobilinogen, UA: 0.2 mg/dL (ref 0.0–1.0)

## 2012-12-06 LAB — POCT PREGNANCY, URINE: Preg Test, Ur: POSITIVE — AB

## 2012-12-06 LAB — URINE MICROSCOPIC-ADD ON

## 2012-12-06 NOTE — MAU Note (Signed)
Just found out today I'm pregnant. I'm diabetic and not on medicine and wanted to be sure everything is ok. Some occ sharp pain in RLQ but none now

## 2012-12-07 ENCOUNTER — Inpatient Hospital Stay (HOSPITAL_COMMUNITY): Payer: Medicaid Other

## 2012-12-07 ENCOUNTER — Encounter (HOSPITAL_COMMUNITY): Payer: Self-pay | Admitting: Family

## 2012-12-07 DIAGNOSIS — A599 Trichomoniasis, unspecified: Secondary | ICD-10-CM

## 2012-12-07 LAB — HEMOGLOBIN A1C
Hgb A1c MFr Bld: 9.3 % — ABNORMAL HIGH (ref <5.7)
Mean Plasma Glucose: 220 mg/dL — ABNORMAL HIGH (ref <117)

## 2012-12-07 LAB — HCG, QUANTITATIVE, PREGNANCY: hCG, Beta Chain, Quant, S: 426 m[IU]/mL — ABNORMAL HIGH (ref <5)

## 2012-12-07 LAB — COMPREHENSIVE METABOLIC PANEL
Albumin: 4 g/dL (ref 3.5–5.2)
BUN: 11 mg/dL (ref 6–23)
Creatinine, Ser: 0.37 mg/dL — ABNORMAL LOW (ref 0.50–1.10)
Potassium: 3.6 mEq/L (ref 3.5–5.1)
Total Protein: 7.3 g/dL (ref 6.0–8.3)

## 2012-12-07 LAB — WET PREP, GENITAL: Clue Cells Wet Prep HPF POC: NONE SEEN

## 2012-12-07 LAB — CBC
HCT: 38.3 % (ref 36.0–46.0)
MCHC: 34.2 g/dL (ref 30.0–36.0)
RDW: 13 % (ref 11.5–15.5)

## 2012-12-07 MED ORDER — GLYBURIDE 5 MG PO TABS: 5.0000 mg | ORAL_TABLET | Freq: Two times a day (BID) | ORAL | Status: DC

## 2012-12-07 MED ORDER — METRONIDAZOLE 500 MG PO TABS
2000.0000 mg | ORAL_TABLET | Freq: Once | ORAL | Status: AC
  Administered 2012-12-07: 2000 mg via ORAL
  Filled 2012-12-07: qty 4

## 2012-12-07 NOTE — MAU Provider Note (Signed)
History     CSN: [id number]  Arrival date and time: 12/06/12 2245   First Provider Initiated Contact with Patient 12/07/12 0009      Chief Complaint  Patient presents with  . Possible Pregnancy   HPI  Pt is a G2P1001 at 4.6 wks IUP here with report of right pelvic pain that is intermittent in nature that started two weeks ago.  +breast tenderness.  No report of vaginal bleeding.  +abnormal vaginal discharge with +odor x past month.  Pt diagnosed with diabetes in 2009, did not take the medication due to how it made her feel.  Here to make "sure baby okay".    Past Medical History  Diagnosis Date  . Diabetes mellitus without complication     Diagnosed in 2009; no meds     Past Surgical History  Procedure Laterality Date  . Cesarean section      Family History  Problem Relation Age of Onset  . Diabetes Mother   . Hypertension Mother   . Diabetes Father     History  Substance Use Topics  . Smoking status: Current Some Day Smoker  . Smokeless tobacco: Not on file  . Alcohol Use: Yes     Comment: Occasional    Allergies: No Known Allergies  Prescriptions prior to admission  Medication Sig Dispense Refill  . acetaminophen (TYLENOL) 500 MG tablet Take 1,000 mg by mouth every 6 (six) hours as needed. For pain      . aspirin-acetaminophen-caffeine (EXCEDRIN MIGRAINE) 250-250-65 MG per tablet Take 1 tablet by mouth every 6 (six) hours as needed for pain.      . promethazine (PHENERGAN) 25 MG tablet Take 1 tablet (25 mg total) by mouth every 6 (six) hours as needed for nausea.  12 tablet  0    Review of Systems  Gastrointestinal: Positive for abdominal pain.  Genitourinary:       Abnormal vaginal discharge  All other systems reviewed and are negative.   Physical Exam   Blood pressure 134/82, pulse 121, temperature 98 F (36.7 C), temperature source Oral, resp. rate 20, height 5\' 4"  (1.626 m), weight 74.118 kg (163 lb 6.4 oz), last menstrual period 11/03/2012, SpO2  100.00%.  Physical Exam  MAU Course  Procedures  Ultrasound: IMPRESSION:  Single intrauterine gestational sac noted, with a mean sac diameter  of 2.6 mm, corresponding to a gestational age of [redacted] weeks 5 days.  This matches the gestational age of [redacted] weeks 0 days by LMP,  reflecting an estimated date of delivery of August 09, 2013.  Results for orders placed during the hospital encounter of 12/06/12 (from the past 24 hour(s))  URINALYSIS, ROUTINE W REFLEX MICROSCOPIC     Status: Abnormal   Collection Time    12/06/12 11:05 PM      Result Value Range   Color, Urine YELLOW  YELLOW   APPearance CLEAR  CLEAR   Specific Gravity, Urine 1.015  1.005 - 1.030   pH 6.0  5.0 - 8.0   Glucose, UA >1000 (*) NEGATIVE mg/dL   Hgb urine dipstick TRACE (*) NEGATIVE   Bilirubin Urine NEGATIVE  NEGATIVE   Ketones, ur 15 (*) NEGATIVE mg/dL   Protein, ur NEGATIVE  NEGATIVE mg/dL   Urobilinogen, UA 0.2  0.0 - 1.0 mg/dL   Nitrite NEGATIVE  NEGATIVE   Leukocytes, UA SMALL (*) NEGATIVE  URINE MICROSCOPIC-ADD ON     Status: None   Collection Time    12/06/12 11:05 PM  Result Value Range   Squamous Epithelial / LPF RARE  RARE   WBC, UA 0-2  <3 WBC/hpf   RBC / HPF 3-6  <3 RBC/hpf   Bacteria, UA RARE  RARE   Urine-Other TRICHOMONAS PRESENT    POCT PREGNANCY, URINE     Status: Abnormal   Collection Time    12/06/12 11:19 PM      Result Value Range   Preg Test, Ur POSITIVE (*) NEGATIVE  WET PREP, GENITAL     Status: Abnormal   Collection Time    12/07/12 12:23 AM      Result Value Range   Yeast Wet Prep HPF POC NONE SEEN  NONE SEEN   Trich, Wet Prep FEW (*) NONE SEEN   Clue Cells Wet Prep HPF POC NONE SEEN  NONE SEEN   WBC, Wet Prep HPF POC TOO NUMEROUS TO COUNT (*) NONE SEEN  HCG, QUANTITATIVE, PREGNANCY     Status: Abnormal   Collection Time    12/07/12 12:34 AM      Result Value Range   hCG, Beta Chain, Quant, S 426 (*) <5 mIU/mL  CBC     Status: Abnormal   Collection Time    12/07/12  12:34 AM      Result Value Range   WBC 11.4 (*) 4.0 - 10.5 K/uL   RBC 4.38  3.87 - 5.11 MIL/uL   Hemoglobin 13.1  12.0 - 15.0 g/dL   HCT 78.4  69.6 - 29.5 %   MCV 87.4  78.0 - 100.0 fL   MCH 29.9  26.0 - 34.0 pg   MCHC 34.2  30.0 - 36.0 g/dL   RDW 28.4  13.2 - 44.0 %   Platelets 380  150 - 400 K/uL  COMPREHENSIVE METABOLIC PANEL     Status: Abnormal   Collection Time    12/07/12 12:34 AM      Result Value Range   Sodium 134 (*) 135 - 145 mEq/L   Potassium 3.6  3.5 - 5.1 mEq/L   Chloride 98  96 - 112 mEq/L   CO2 20  19 - 32 mEq/L   Glucose, Bld 253 (*) 70 - 99 mg/dL   BUN 11  6 - 23 mg/dL   Creatinine, Ser 1.02 (*) 0.50 - 1.10 mg/dL   Calcium 9.9  8.4 - 72.5 mg/dL   Total Protein 7.3  6.0 - 8.3 g/dL   Albumin 4.0  3.5 - 5.2 g/dL   AST 13  0 - 37 U/L   ALT 15  0 - 35 U/L   Alkaline Phosphatase 76  39 - 117 U/L   Total Bilirubin 0.3  0.3 - 1.2 mg/dL   GFR calc non Af Amer >90  >90 mL/min   GFR calc Af Amer >90  >90 mL/min     Assessment and Plan  Trichomoniasis Intrauterine Pregnancy Uncontrolled Diabetes  Plan: Consult with Dr. Marice Potter > begin Glyburide 5 mg BID Schedule appt in [facility] HgbA1c RX Flagyl 2 gm Repeat BHCG in 48 hrs  [facility] 12/07/2012, 12:16 AM

## 2012-12-08 ENCOUNTER — Encounter (HOSPITAL_COMMUNITY): Payer: Self-pay | Admitting: *Deleted

## 2012-12-08 LAB — GC/CHLAMYDIA PROBE AMP: CT Probe RNA: NEGATIVE

## 2012-12-11 ENCOUNTER — Other Ambulatory Visit: Payer: Self-pay | Admitting: Adult Health

## 2012-12-11 ENCOUNTER — Other Ambulatory Visit (HOSPITAL_COMMUNITY)
Admission: RE | Admit: 2012-12-11 | Discharge: 2012-12-11 | Disposition: A | Discharge status: Home / Self Care | Payer: Medicaid Other | Source: Ambulatory Visit | Attending: Obstetrics and Gynecology | Admitting: Obstetrics and Gynecology

## 2012-12-11 DIAGNOSIS — Z01419 Encounter for gynecological examination (general) (routine) without abnormal findings: Secondary | ICD-10-CM | POA: Insufficient documentation

## 2012-12-11 DIAGNOSIS — Z113 Encounter for screening for infections with a predominantly sexual mode of transmission: Secondary | ICD-10-CM | POA: Insufficient documentation

## 2012-12-11 LAB — OB RESULTS CONSOLE VARICELLA ZOSTER ANTIBODY, IGG: Varicella: IMMUNE

## 2012-12-11 LAB — OB RESULTS CONSOLE RPR: RPR: NONREACTIVE

## 2012-12-11 LAB — CYSTIC FIBROSIS DIAGNOSTIC STUDY: Interpretation-CFDNA:: NEGATIVE

## 2012-12-11 LAB — OB RESULTS CONSOLE HEPATITIS B SURFACE ANTIGEN: Hepatitis B Surface Ag: NEGATIVE

## 2012-12-11 LAB — OB RESULTS CONSOLE HIV ANTIBODY (ROUTINE TESTING): HIV: NONREACTIVE

## 2012-12-23 ENCOUNTER — Encounter: Payer: Self-pay | Admitting: *Deleted

## 2012-12-23 DIAGNOSIS — G43909 Migraine, unspecified, not intractable, without status migrainosus: Secondary | ICD-10-CM

## 2012-12-23 DIAGNOSIS — Z8619 Personal history of other infectious and parasitic diseases: Secondary | ICD-10-CM | POA: Insufficient documentation

## 2013-01-07 ENCOUNTER — Telehealth: Payer: Self-pay | Admitting: Adult Health

## 2013-01-07 NOTE — Telephone Encounter (Signed)
I believe this was sent to me in error.  Lyndal Pulley Description: 27 year old female  01/07/2013 Telephone Provider: Adline Potter, NP  MRN: [id number] Department: [facility]    Reason for Call    Advice Only    Pt called, upset about having to wait last Friday and said she needed her sugar checked. Need to know if she needs to come this week           Call Documentation    No notes of this type exist for this encounter.         Encounter MyChart Messages    No messages in this encounter         Routing History    Priority Sent On From To Message Type    01/07/2013 3:04 PM Myrene Buddy H Barrett Tarri Fuller, CMA Patient Calls      Created by    Susie Cassette Barrett on 01/07/2013 03:02 PM

## 2013-01-08 ENCOUNTER — Telehealth: Payer: Self-pay | Admitting: Adult Health

## 2013-01-08 ENCOUNTER — Encounter: Payer: Self-pay | Admitting: Obstetrics and Gynecology

## 2013-01-08 NOTE — Telephone Encounter (Signed)
Got voicemail, left message to call back.

## 2013-01-15 ENCOUNTER — Encounter: Payer: Self-pay | Admitting: Obstetrics & Gynecology

## 2013-01-17 ENCOUNTER — Inpatient Hospital Stay (HOSPITAL_COMMUNITY)
Admission: AD | Admit: 2013-01-17 | Discharge: 2013-01-17 | Disposition: A | Discharge status: Home / Self Care | Payer: Medicaid Other | Source: Ambulatory Visit | Attending: Obstetrics & Gynecology | Admitting: Obstetrics & Gynecology

## 2013-01-17 ENCOUNTER — Encounter (HOSPITAL_COMMUNITY): Payer: Self-pay | Admitting: *Deleted

## 2013-01-17 DIAGNOSIS — O2390 Unspecified genitourinary tract infection in pregnancy, unspecified trimester: Secondary | ICD-10-CM

## 2013-01-17 DIAGNOSIS — O24919 Unspecified diabetes mellitus in pregnancy, unspecified trimester: Secondary | ICD-10-CM | POA: Insufficient documentation

## 2013-01-17 DIAGNOSIS — O24111 Pre-existing diabetes mellitus, type 2, in pregnancy, first trimester: Secondary | ICD-10-CM

## 2013-01-17 DIAGNOSIS — O98819 Other maternal infectious and parasitic diseases complicating pregnancy, unspecified trimester: Secondary | ICD-10-CM | POA: Insufficient documentation

## 2013-01-17 DIAGNOSIS — E119 Type 2 diabetes mellitus without complications: Secondary | ICD-10-CM | POA: Insufficient documentation

## 2013-01-17 DIAGNOSIS — A5901 Trichomonal vulvovaginitis: Secondary | ICD-10-CM | POA: Insufficient documentation

## 2013-01-17 LAB — WET PREP, GENITAL: Yeast Wet Prep HPF POC: NONE SEEN

## 2013-01-17 MED ORDER — ONDANSETRON 8 MG PO TBDP
8.0000 mg | ORAL_TABLET | ORAL | Status: AC
  Administered 2013-01-17: 8 mg via ORAL
  Filled 2013-01-17: qty 1

## 2013-01-17 MED ORDER — METRONIDAZOLE 500 MG PO TABS
2000.0000 mg | ORAL_TABLET | ORAL | Status: AC
  Administered 2013-01-17: 2000 mg via ORAL
  Filled 2013-01-17: qty 4

## 2013-01-17 NOTE — MAU Note (Signed)
PT SAYS  SHE WAS AT WORK AT [employer]-   SHE WAS STANDING- BECAME SICK TO STOMACH- STARTED SWEATING AND SHAKING-  WENT AND CHECKED HER BS-  77-  THEN SHE ATE 2 BITES OF CANDY BAR- THEN  WEN T AND ATE FRENCH FRIES-   THEN RECHECKED BS- 217.    FOR DIABETES-   SHE TAKES GLYBURIDE-  TOOK THIS AM.  [facility]- BUT HAS TRANSFERRED  TO DR  ACROSS FROM [facility]-  NOT BEEN SEN THERE YET-   NO APPOINTMENT YET.    IN TRIAGE -   SAYS SHE FEELS BETTER. NOW.

## 2013-01-17 NOTE — MAU Note (Addendum)
Patient presents to MAU with c/o of having a hypoglycemic episode at 1130 today at work. Reports her blood sugar dropped to 77 and she "almost passed out" - reports she ate a couple bites of candy bar, then ate burger and fries and then CBG was 217, then came here.  Reports no vaginal bleeding, LOF or cramping.

## 2013-01-17 NOTE — MAU Provider Note (Signed)
Chief Complaint: No chief complaint on file.   First Provider Initiated Contact with Patient 01/17/13 1823     SUBJECTIVE HPI: Evelyn [name] is a 27 y.o. G2P1001 at [redacted]w[redacted]d by LMP who presents to maternity admissions reporting low blood sugar at work today of 77 accompanied by chills, sweating and dizziness.  She ate a few bites of a candy bar and had lunch and then her sugar was 200+.  She is worried about the baby and her pregnancy and is unsure of what she should be eating with her diabetes.  She reports she was gestational diabetic in her last pregnancy and "it just never went away".  She has not had any formal diabetic education.  She plans to see [facility] but does not yet have an appointment.  Also, she has had vaginal itching with discharge since her treatment ~5 weeks ago for trichomonas. She has not been with her partner since her treatment.  She denies bright red vaginal itching/burning, urinary symptoms, h/a, n/v, or fever/chills.     Past Medical History  Diagnosis Date  . Diabetes mellitus without complication     Diagnosed in 2009; no meds   . Migraine   . History of trichomoniasis    Past Surgical History  Procedure Laterality Date  . Cesarean section    . Dental surgery     History   Social History  . Marital Status: Divorced    Spouse Name: N/A    Number of Children: N/A  . Years of Education: N/A   Occupational History  . Not on file.   Social History Main Topics  . Smoking status: Current Some Day Smoker  . Smokeless tobacco: Not on file  . Alcohol Use: Yes     Comment: Occasional  . Drug Use: No  . Sexually Active: Yes    Birth Control/ Protection: None   Other Topics Concern  . Not on file   Social History Narrative  . No narrative on file   No current facility-administered medications on file prior to encounter.   Current Outpatient Prescriptions on File Prior to Encounter  Medication Sig Dispense Refill  . acetaminophen (TYLENOL) 500  MG tablet Take 1,000 mg by mouth every 6 (six) hours as needed. For pain      . prenatal vitamin w/FE, FA (PRENATAL 1 + 1) 27-1 MG TABS Take 1 tablet by mouth daily at 12 noon.       No Known Allergies  ROS: Pertinent items in HPI  OBJECTIVE Blood pressure 116/68, pulse 93, temperature 98.1 F (36.7 C), temperature source Oral, resp. rate 20, height 6\' 2"  (1.88 m), weight 75.411 kg (166 lb 4 oz), last menstrual period 11/02/2012. GENERAL: Well-developed, well-nourished female in no acute distress.  HEENT: Normocephalic HEART: normal rate RESP: normal effort ABDOMEN: Soft, non-tender EXTREMITIES: Nontender, no edema NEURO: Alert and oriented Pelvic exam: Cervix mildly erythemetous, visually closed, large amount frothy yellow discharge, vaginal walls and external genitalia mildly erythemetous Bimanual exam: Cervix 0/long/high, firm, anterior, positive CMT, uterus nontender, ~ 10 week size  LAB RESULTS Results for orders placed during the hospital encounter of 01/17/13 (from the past 24 hour(s))  GLUCOSE, CAPILLARY     Status: None   Collection Time    01/17/13  6:00 PM      Result Value Range   Glucose-Capillary 89  70 - 99 mg/dL  WET PREP, GENITAL     Status: Abnormal   Collection Time    01/17/13  6:50 PM      Result Value Range   Yeast Wet Prep HPF POC NONE SEEN  NONE SEEN   Trich, Wet Prep FEW (*) NONE SEEN   Clue Cells Wet Prep HPF POC FEW (*) NONE SEEN   WBC, Wet Prep HPF POC MANY (*) NONE SEEN    ASSESSMENT 1. Trichomonal vaginitis in pregnancy, first trimester   2. Pre-existing type 2 diabetes mellitus in pregnancy in first trimester     PLAN Flagyl 2g PO x1 dose in MAU Discharge home Diabetic teaching, discussion of healthy diet in MAU F/U with prenatal care as scheduled Return to MAU as needed    Medication List    ASK your doctor about these medications       acetaminophen 500 MG tablet  Commonly known as:  TYLENOL  Take 1,000 mg by mouth every 6 (six)  hours as needed. For pain     glyBURIDE 5 MG tablet  Commonly known as:  DIABETA  Take 2.5 mg by mouth 2 (two) times daily with a meal.     prenatal vitamin w/FE, FA 27-1 MG Tabs  Take 1 tablet by mouth daily at 12 noon.           Follow-up Information   Schedule an appointment as soon as possible for a visit with Bing Plume, MD. (Return to MAU as needed)    Contact information:   [address], [address] [place] [address] [phone]       Sharen Counter Certified Nurse-Midwife 01/17/2013  7:28 PM

## 2013-01-26 NOTE — MAU Provider Note (Signed)
Attestation of Attending Supervision of Advanced Practitioner (PA/CNM/NP): Evaluation and management procedures were performed by the Advanced Practitioner under my supervision and collaboration.  I have reviewed the Advanced Practitioner's note and chart, and I agree with the management and plan.  Shanyia Stines, MD, FACOG Attending Obstetrician & Gynecologist Faculty Practice, [facility]  

## 2013-02-04 LAB — OB RESULTS CONSOLE GC/CHLAMYDIA
Chlamydia: NEGATIVE
Gonorrhea: NEGATIVE

## 2013-02-24 ENCOUNTER — Encounter: Payer: Self-pay | Admitting: Dietician

## 2013-02-24 ENCOUNTER — Encounter: Payer: Medicaid Other | Attending: Obstetrics and Gynecology | Admitting: Dietician

## 2013-02-24 VITALS — Ht 62.0 in | Wt 166.4 lb

## 2013-02-24 DIAGNOSIS — O99810 Abnormal glucose complicating pregnancy: Secondary | ICD-10-CM | POA: Insufficient documentation

## 2013-02-24 DIAGNOSIS — Z713 Dietary counseling and surveillance: Secondary | ICD-10-CM | POA: Insufficient documentation

## 2013-02-24 DIAGNOSIS — O24912 Unspecified diabetes mellitus in pregnancy, second trimester: Secondary | ICD-10-CM

## 2013-02-24 NOTE — Progress Notes (Signed)
  Patient was seen on 02/24/2013 for Gestational Diabetes self-management 1:1 at [facility]. This young lady had GDM with her last pregnancy in 2009 and continued to have Type 2 following the delivery.  She admits that she did not consistently seek medical assistance in taking care of her glucose issues and has been on Metformin in the past but did not take it as prescribed.  She comes into day at 15 weeks.  Currently on Glyburide 2.5 mg twice daily.  Has had some issues with blood glucose lows.  She does give a history of an inconsistent meal pattern and skipping meals, and few snacks.  Currently drinking only milk and water.  Has not been consistently testing blood glucose levels.  Has not recorded her glucose levels.   The following learning objectives were met by the patient during this visit.   States why dietary management is important in controlling blood glucose  Describes the effects each nutrient has on blood glucose levels  Demonstrates ability to create a balanced meal plan  Demonstrates carbohydrate counting   States when to check blood glucose levels  Demonstrates proper blood glucose monitoring techniques  States the effect of stress and exercise on blood glucose levels  States the importance of limiting caffeine and abstaining from alcohol and smoking  Blood glucose monitor given: Has an Accu-Chek Aviva Meter.  Patient instructed to monitor glucose levels: FBS: 60 - <90 1 hour: <140   Patient received handouts:  Nutrition Diabetes and Pregnancy  Carbohydrate Counting List  Patient will be seen for follow-up as needed.  Have ask that she fax me her glucose records on Monday, [address] 12[address].  Will review.  She is to note any episodes of low blood glucose.  If she continues to have issues, will have her start a food diary.

## 2013-03-05 ENCOUNTER — Telehealth: Payer: Self-pay | Admitting: *Deleted

## 2013-03-05 NOTE — Telephone Encounter (Signed)
Patient left a message stating that she would like for someone to call her back.

## 2013-03-05 NOTE — Telephone Encounter (Signed)
Called pt and left message to return call to the clinics.  

## 2013-03-09 NOTE — Telephone Encounter (Signed)
Called patient and stated that her concern has resolved. No further need of assistance.

## 2013-03-27 ENCOUNTER — Encounter (HOSPITAL_COMMUNITY): Payer: Self-pay | Admitting: *Deleted

## 2013-03-27 ENCOUNTER — Emergency Department (HOSPITAL_COMMUNITY)
Admission: EM | Admit: 2013-03-27 | Discharge: 2013-03-27 | Disposition: A | Discharge status: Home / Self Care | Payer: Medicaid Other | Attending: Emergency Medicine | Admitting: Emergency Medicine

## 2013-03-27 DIAGNOSIS — A5901 Trichomonal vulvovaginitis: Secondary | ICD-10-CM | POA: Insufficient documentation

## 2013-03-27 DIAGNOSIS — R1032 Left lower quadrant pain: Secondary | ICD-10-CM | POA: Insufficient documentation

## 2013-03-27 DIAGNOSIS — A599 Trichomoniasis, unspecified: Secondary | ICD-10-CM

## 2013-03-27 DIAGNOSIS — O99330 Smoking (tobacco) complicating pregnancy, unspecified trimester: Secondary | ICD-10-CM | POA: Insufficient documentation

## 2013-03-27 DIAGNOSIS — O2390 Unspecified genitourinary tract infection in pregnancy, unspecified trimester: Secondary | ICD-10-CM | POA: Insufficient documentation

## 2013-03-27 DIAGNOSIS — Z3490 Encounter for supervision of normal pregnancy, unspecified, unspecified trimester: Secondary | ICD-10-CM

## 2013-03-27 DIAGNOSIS — Z8679 Personal history of other diseases of the circulatory system: Secondary | ICD-10-CM | POA: Insufficient documentation

## 2013-03-27 DIAGNOSIS — B9689 Other specified bacterial agents as the cause of diseases classified elsewhere: Secondary | ICD-10-CM | POA: Insufficient documentation

## 2013-03-27 DIAGNOSIS — Z79899 Other long term (current) drug therapy: Secondary | ICD-10-CM | POA: Insufficient documentation

## 2013-03-27 DIAGNOSIS — O24919 Unspecified diabetes mellitus in pregnancy, unspecified trimester: Secondary | ICD-10-CM | POA: Insufficient documentation

## 2013-03-27 DIAGNOSIS — R110 Nausea: Secondary | ICD-10-CM | POA: Insufficient documentation

## 2013-03-27 DIAGNOSIS — A499 Bacterial infection, unspecified: Secondary | ICD-10-CM | POA: Insufficient documentation

## 2013-03-27 DIAGNOSIS — N760 Acute vaginitis: Secondary | ICD-10-CM | POA: Insufficient documentation

## 2013-03-27 DIAGNOSIS — N390 Urinary tract infection, site not specified: Secondary | ICD-10-CM

## 2013-03-27 LAB — WET PREP, GENITAL

## 2013-03-27 LAB — URINE MICROSCOPIC-ADD ON

## 2013-03-27 LAB — URINALYSIS, ROUTINE W REFLEX MICROSCOPIC
Bilirubin Urine: NEGATIVE
Specific Gravity, Urine: 1.01 (ref 1.005–1.030)
pH: 7 (ref 5.0–8.0)

## 2013-03-27 MED ORDER — CEPHALEXIN 500 MG PO CAPS: 500.0000 mg | ORAL_CAPSULE | Freq: Three times a day (TID) | ORAL | Status: DC

## 2013-03-27 MED ORDER — METRONIDAZOLE 500 MG PO TABS: 500.0000 mg | ORAL_TABLET | Freq: Two times a day (BID) | ORAL | Status: DC

## 2013-03-27 NOTE — ED Notes (Signed)
Joni Reining, RN rapid response RN at [facility] notified of patient. Patient attached to [facility] monitor. [facility] will monitor for contractions. RN to call back with report later.

## 2013-03-27 NOTE — ED Provider Notes (Signed)
History     CSN: [id number]  Arrival date & time 03/27/13  1043   First MD Initiated Contact with Patient 03/27/13 1159      Chief Complaint  Patient presents with  . Abdominal Pain  . Nausea    (Consider location/radiation/quality/duration/timing/severity/associated sxs/prior treatment) HPI Pt is G2P1Ab0, EDC 08/09/2013, 20 weeks +5. Pt states she has been diabetic since her first pregnancy 5 years ago. She reports a normal pregnancy except for UTI's and intermittent spotting that has been evaluated by her OB and was told everything seemed fine and to be rechecked if it got worse, until last night when she started having spotting again when she urinates and wipes. No blood in her underwear or need to wear a pad before of with this episode. She reports some LLQ pain off and on for a few minutes. She states is she stands up quickly it will hurt. She has had nausea without vomiting, dysuria or fever. She does c/o frequency.   PCP [facility] in [place] Dr Senaida Ores  Past Medical History  Diagnosis Date  . Diabetes mellitus without complication     Diagnosed in 2009; no meds   . Migraine   . History of trichomoniasis     Past Surgical History  Procedure Laterality Date  . Cesarean section    . Dental surgery      Family History  Problem Relation Age of Onset  . Diabetes Mother   . Hypertension Mother   . Diabetes Father   . Birth defects Sister     heart was on wrong side  . Cancer Maternal Grandmother     History  Substance Use Topics  . Smoking status: Current Some Day Smoker  . Smokeless tobacco: Not on file  . Alcohol Use: Yes     Comment: Occasional  employed Quit smoking and drinking while pregnant  OB History   Grav Para Term Preterm Abortions TAB SAB Ect Mult Living   2 1 1       1       Review of Systems  All other systems reviewed and are negative.    Allergies  Review of patient's allergies indicates no known allergies.  Home  Medications   Current Outpatient Rx  Name  Route  Sig  Dispense  Refill  . acetaminophen (TYLENOL) 500 MG tablet   Oral   Take 1,000 mg by mouth every 6 (six) hours as needed. For pain         . glyBURIDE (DIABETA) 5 MG tablet   Oral   Take 2.5 mg by mouth 2 (two) times daily with a meal.         . prenatal vitamin w/FE, FA (PRENATAL 1 + 1) 27-1 MG TABS   Oral   Take 1 tablet by mouth daily at 12 noon.           BP 119/72  Pulse 100  Temp(Src) 98.2 F (36.8 C) (Oral)  Resp 16  Ht 5\' 4"  (1.626 m)  Wt 165 lb (74.844 kg)  BMI 28.31 kg/m2  SpO2 100%  LMP 11/02/2012  Vital signs normal    Physical Exam  Nursing note and vitals reviewed. Constitutional: She is oriented to person, place, and time. She appears well-developed and well-nourished.  Non-toxic appearance. She does not appear ill. No distress.  HENT:  Head: Normocephalic and atraumatic.  Right Ear: External ear normal.  Left Ear: External ear normal.  Nose: Nose normal. No mucosal edema or  rhinorrhea.  Mouth/Throat: Oropharynx is clear and moist and mucous membranes are normal. No dental abscesses or edematous.  Eyes: Conjunctivae and EOM are normal. Pupils are equal, round, and reactive to light.  Neck: Normal range of motion and full passive range of motion without pain. Neck supple.  Cardiovascular: Normal rate, regular rhythm and normal heart sounds.  Exam reveals no gallop and no friction rub.   No murmur heard. Pulmonary/Chest: Effort normal and breath sounds normal. No respiratory distress. She has no wheezes. She has no rhonchi. She has no rales. She exhibits no tenderness and no crepitus.  Abdominal: Soft. Normal appearance and bowel sounds are normal. She exhibits no distension. There is no tenderness. There is no rebound and no guarding.  Uterus c/w dates, nontender  Genitourinary:  Normal external genitalia, moderate white foamy discharge, purplish cx, nontender uterus, mildly tender in area of  left adenexa without masses, right nontender. No blood seen in the vault.   Musculoskeletal: Normal range of motion. She exhibits no edema and no tenderness.  Moves all extremities well.   Neurological: She is alert and oriented to person, place, and time. She has normal strength. No cranial nerve deficit.  Skin: Skin is warm, dry and intact. No rash noted. No erythema. No pallor.  Psychiatric: She has a normal mood and affect. Her speech is normal and behavior is normal. Her mood appears not anxious.    ED Course  Procedures (including critical care time)   14:14 Dr Senaida Ores, had [place] on 5/21, no placenta previa, treated for UTI with macrobid. States flagyl is okay, needs to see in the office in the next week.   Results for orders placed during the hospital encounter of 03/27/13  WET PREP, GENITAL      Result Value Range   Yeast Wet Prep HPF POC NONE SEEN  NONE SEEN   Trich, Wet Prep FEW (*) NONE SEEN   Clue Cells Wet Prep HPF POC MODERATE (*) NONE SEEN   WBC, Wet Prep HPF POC TOO NUMEROUS TO COUNT (*) NONE SEEN  URINALYSIS, ROUTINE W REFLEX MICROSCOPIC      Result Value Range   Color, Urine YELLOW  YELLOW   APPearance CLEAR  CLEAR   Specific Gravity, Urine 1.010  1.005 - 1.030   pH 7.0  5.0 - 8.0   Glucose, UA NEGATIVE  NEGATIVE mg/dL   Hgb urine dipstick LARGE (*) NEGATIVE   Bilirubin Urine NEGATIVE  NEGATIVE   Ketones, ur 40 (*) NEGATIVE mg/dL   Protein, ur TRACE (*) NEGATIVE mg/dL   Urobilinogen, UA 0.2  0.0 - 1.0 mg/dL   Nitrite NEGATIVE  NEGATIVE   Leukocytes, UA LARGE (*) NEGATIVE  URINE MICROSCOPIC-ADD ON      Result Value Range   Squamous Epithelial / LPF MANY (*) RARE   WBC, UA TOO NUMEROUS TO COUNT  <3 WBC/hpf   RBC / HPF TOO NUMEROUS TO COUNT  <3 RBC/hpf   Bacteria, UA MANY (*) RARE  GLUCOSE, CAPILLARY      Result Value Range   Glucose-Capillary 82  70 - 99 mg/dL   Laboratory interpretation all normal except UTI, BV, Trichonomiasis     1. UTI (lower  urinary tract infection)   2. BV (bacterial vaginosis)   3. Trichomoniasis   4. Pregnancy     Discharge Medication List as of 03/27/2013  2:28 PM    START taking these medications   Details  cephALEXin (KEFLEX) 500 MG capsule Take 1 capsule (500  mg total) by mouth 3 (three) times daily., Starting 03/27/2013, Until Discontinued, Print    metroNIDAZOLE (FLAGYL) 500 MG tablet Take 1 tablet (500 mg total) by mouth 2 (two) times daily., Starting 03/27/2013, Until Discontinued, Print        Plan discharge  Devoria Albe, MD, FACEP     MDM          Ward Givens, MD 03/27/13 1739

## 2013-03-27 NOTE — ED Notes (Signed)
Joni Reining, RN called stating no contractions were noted on monitoring strips. MD made aware.

## 2013-03-27 NOTE — ED Notes (Signed)
Monitoring strip printed and faxed to Rapid Response RN at [facility]. [phone]

## 2013-03-27 NOTE — ED Notes (Signed)
Pt states intermittent, sharp lower abdominal pain which began last night. Also states nausea and headache. Chest discomfort began this morning. NAD. Pt is 5 mo pregnant.

## 2013-03-27 NOTE — Progress Notes (Signed)
Call from [facility] regarding pt at 20w 5d with complaint of left lower intermittent sharp pain. Toco applied. Attempting ot connect to surveillance

## 2013-03-27 NOTE — ED Notes (Signed)
Patient c/o intermittent left lower abdominal pain since last night with intermittent bright red vaginal spotting as well. Last episode of spotting was last night. Nausea only. Patient in no distress at this time. No pain at present. OB is Dr Senaida Ores at [facility].

## 2013-03-27 NOTE — ED Notes (Signed)
Having issues with [facility] ability to connect to monitoring device. Recording paper copy from machine. Will fax to [facility] if they are still unable to connect to monitor.

## 2013-03-28 LAB — URINE CULTURE

## 2013-03-29 ENCOUNTER — Telehealth (HOSPITAL_COMMUNITY): Payer: Self-pay | Admitting: Emergency Medicine

## 2013-03-29 LAB — GC/CHLAMYDIA PROBE AMP
CT Probe RNA: POSITIVE — AB
GC Probe RNA: NEGATIVE

## 2013-03-29 NOTE — ED Notes (Signed)
Post ED Visit - Positive Culture Follow-up  Culture report reviewed by antimicrobial stewardship pharmacist: [x]  Wes Dulaney, Pharm.D., BCPS []  Celedonio Miyamoto, Pharm.D., BCPS []  Georgina Pillion, Pharm.D., BCPS []  [place]ia, [address].D., BCPS, AAHIVP []  Estella Husk, Pharm.D., BCPS, AAHIVP  Positive urine culture Treated with Keflex, organism sensitive to the same and no further patient follow-up is required at this time.  Kylie A Holland 03/29/2013, 5:20 PM

## 2013-03-30 NOTE — ED Notes (Signed)
+   Chlamydia Charrt sent to EDP office for review.

## 2013-04-02 ENCOUNTER — Telehealth (HOSPITAL_COMMUNITY): Payer: Self-pay | Admitting: Emergency Medicine

## 2013-04-03 NOTE — ED Notes (Signed)
Chart returned from EDP office on 04/02/2013. Order for Suprax 400 mg tab # 1 take one tab po once Azithromycin 250 mg tab #4 Take 4 tabs po once written by Orlean Bradford called to [facility] in [place] @ [id number] by Sheralyn Boatman PFM

## 2013-04-22 ENCOUNTER — Inpatient Hospital Stay (HOSPITAL_COMMUNITY)
Admission: AD | Admit: 2013-04-22 | Discharge: 2013-04-22 | Disposition: A | Discharge status: Home / Self Care | Payer: Medicaid Other | Source: Ambulatory Visit | Attending: Obstetrics and Gynecology | Admitting: Obstetrics and Gynecology

## 2013-04-22 DIAGNOSIS — E1165 Type 2 diabetes mellitus with hyperglycemia: Secondary | ICD-10-CM | POA: Insufficient documentation

## 2013-04-22 DIAGNOSIS — O212 Late vomiting of pregnancy: Secondary | ICD-10-CM | POA: Insufficient documentation

## 2013-04-22 DIAGNOSIS — O24919 Unspecified diabetes mellitus in pregnancy, unspecified trimester: Secondary | ICD-10-CM | POA: Insufficient documentation

## 2013-04-22 DIAGNOSIS — IMO0002 Reserved for concepts with insufficient information to code with codable children: Secondary | ICD-10-CM | POA: Insufficient documentation

## 2013-04-22 DIAGNOSIS — O24912 Unspecified diabetes mellitus in pregnancy, second trimester: Secondary | ICD-10-CM

## 2013-04-22 DIAGNOSIS — K219 Gastro-esophageal reflux disease without esophagitis: Secondary | ICD-10-CM | POA: Insufficient documentation

## 2013-04-22 LAB — COMPREHENSIVE METABOLIC PANEL
AST: 13 U/L (ref 0–37)
BUN: 5 mg/dL — ABNORMAL LOW (ref 6–23)
CO2: 20 mEq/L (ref 19–32)
Calcium: 8.8 mg/dL (ref 8.4–10.5)
Creatinine, Ser: 0.29 mg/dL — ABNORMAL LOW (ref 0.50–1.10)
GFR calc Af Amer: >90 mL/min (ref >90)
GFR calc non Af Amer: >90 mL/min (ref >90)

## 2013-04-22 LAB — URINE MICROSCOPIC-ADD ON

## 2013-04-22 LAB — URINALYSIS, ROUTINE W REFLEX MICROSCOPIC
Bilirubin Urine: NEGATIVE
Ketones, ur: 15 mg/dL — AB
Nitrite: NEGATIVE
Urobilinogen, UA: 2 mg/dL — ABNORMAL HIGH (ref 0.0–1.0)
pH: 6 (ref 5.0–8.0)

## 2013-04-22 LAB — CBC
HCT: 34.5 % — ABNORMAL LOW (ref 36.0–46.0)
MCH: 30 pg (ref 26.0–34.0)
MCV: 87.8 fL (ref 78.0–100.0)
Platelets: 296 10*3/uL (ref 150–400)
RBC: 3.93 MIL/uL (ref 3.87–5.11)

## 2013-04-22 MED ORDER — POLYETHYLENE GLYCOL 3350 17 GM/SCOOP PO POWD: 17.0000 g | Freq: Every day | ORAL | Status: DC

## 2013-04-22 MED ORDER — ONDANSETRON 8 MG PO TBDP: 8.0000 mg | ORAL_TABLET | Freq: Three times a day (TID) | ORAL | Status: DC | PRN

## 2013-04-22 MED ORDER — ONDANSETRON 8 MG PO TBDP: 8.0000 mg | ORAL_TABLET | Freq: Once | ORAL | Status: DC

## 2013-04-22 MED ORDER — ONDANSETRON 8 MG PO TBDP
8.0000 mg | ORAL_TABLET | Freq: Once | ORAL | Status: AC
  Administered 2013-04-22: 8 mg via ORAL
  Filled 2013-04-22: qty 1

## 2013-04-22 MED ORDER — GI COCKTAIL ~~LOC~~
30.0000 mL | Freq: Once | ORAL | Status: AC
  Administered 2013-04-22: 30 mL via ORAL
  Filled 2013-04-22: qty 30

## 2013-04-22 NOTE — MAU Note (Signed)
Patient is in with c/o feeling shaky, nausea, headache, generalized abdominal pain and mid sternum pain. Patient states that she woke up at 3am with blood sugar of 62, she felt "shaky" , she ate a bowl of rice krispie cereal with 2% milk. She states that at 9am, her blood sugar rose to 129. She denies vaginal bleeding or lof. She reports good fetal movement. She states that she is a type 2 diabetic on oral medications. She is concerned with unstable blood sugar levels. High carbohydrate diet at home.

## 2013-04-22 NOTE — MAU Note (Signed)
Sugar this morning was 127.  Hasn't eaten anything- no appetite. Woke up at 0330 this morning- sugar had dropped, felt shaky.

## 2013-04-22 NOTE — MAU Provider Note (Signed)
History     CSN: [id number]  Arrival date and time: 04/22/13 1023   First Provider Initiated Contact with Patient 04/22/13 1048      Chief Complaint  Patient presents with  . sugar dropped    HPI  Pt is [redacted]w[redacted]d G2P1 Diabetes since 2009.  Pt is on Glyburide 2.5mg  before breakast and at night if sugar is high.  Pt's BS was 192 last night and pt took glyburide 2.5mg  and woke up at 3 am with blood sugar drop to 63 and had shaking, sweating.  Pt ate a small bowl of rice krispies and didn't feel better, so pt ate some candy.  Pt's blood sugar was 122 this morning.  Pt also had mid sternum chest pain- that is constant and also feels like she wants to throw up over the last 2 days. (son had a GI bug last week) Pt was not hungry this morning and has not eaten anything this morning and pt did not take Glyburide either since she did not eat. Pt denies spotting or bleeding or cramping or LOF.  Past Medical History  Diagnosis Date  . Diabetes mellitus without complication     Diagnosed in 2009; no meds   . Migraine   . History of trichomoniasis     Past Surgical History  Procedure Laterality Date  . Cesarean section    . Dental surgery      Family History  Problem Relation Age of Onset  . Diabetes Mother   . Hypertension Mother   . Diabetes Father   . Birth defects Sister     heart was on wrong side  . Cancer Maternal Grandmother     History  Substance Use Topics  . Smoking status: Current Some Day Smoker  . Smokeless tobacco: Not on file  . Alcohol Use: Yes     Comment: Occasional    Allergies: No Known Allergies  Prescriptions prior to admission  Medication Sig Dispense Refill  . acetaminophen (TYLENOL) 500 MG tablet Take 1,000 mg by mouth every 6 (six) hours as needed. For pain      . cephALEXin (KEFLEX) 500 MG capsule Take 1 capsule (500 mg total) by mouth 3 (three) times daily.  30 capsule  0  . glyBURIDE (DIABETA) 5 MG tablet Take 2.5 mg by mouth 2 (two) times daily with  a meal.      . metroNIDAZOLE (FLAGYL) 500 MG tablet Take 1 tablet (500 mg total) by mouth 2 (two) times daily.  14 tablet  0  . prenatal vitamin w/FE, FA (PRENATAL 1 + 1) 27-1 MG TABS Take 1 tablet by mouth daily at 12 noon.        Review of Systems  Constitutional: Positive for diaphoresis. Negative for fever.  Cardiovascular: Positive for chest pain.  Gastrointestinal: Positive for nausea. Negative for abdominal pain, diarrhea and constipation.  Genitourinary: Negative for dysuria and urgency.  Neurological: Positive for weakness.   Physical Exam   Blood pressure 118/68, pulse 116, temperature 97.9 F (36.6 C), temperature source Oral, resp. rate 18, last menstrual period 11/02/2012, SpO2 97.00%.  Physical Exam  Constitutional: She is oriented to person, place, and time.  Cardiovascular: Normal rate.   Respiratory: Effort normal.  GI: Soft.  Soft, no ctx noted. FHR reassuring for gestational age  Musculoskeletal: Normal range of motion.  Neurological: She is alert and oriented to person, place, and time.  Skin: Skin is warm and dry.  Psychiatric: She has a normal mood and  affect.    MAU Course  Procedures Pt's chest pain felt better after GI cocktail but feeling nauseated- zofran 8mg  ODT ordered Results for orders placed during the hospital encounter of 04/22/13 (from the past 24 hour(s))  URINALYSIS, ROUTINE W REFLEX MICROSCOPIC     Status: Abnormal   Collection Time    04/22/13 10:30 AM      Result Value Range   Color, Urine YELLOW  YELLOW   APPearance CLOUDY (*) CLEAR   Specific Gravity, Urine >1.030 (*) 1.005 - 1.030   pH 6.0  5.0 - 8.0   Glucose, UA 100 (*) NEGATIVE mg/dL   Hgb urine dipstick TRACE (*) NEGATIVE   Bilirubin Urine NEGATIVE  NEGATIVE   Ketones, ur 15 (*) NEGATIVE mg/dL   Protein, ur 30 (*) NEGATIVE mg/dL   Urobilinogen, UA 2.0 (*) 0.0 - 1.0 mg/dL   Nitrite NEGATIVE  NEGATIVE   Leukocytes, UA MODERATE (*) NEGATIVE  URINE MICROSCOPIC-ADD ON      Status: Abnormal   Collection Time    04/22/13 10:30 AM      Result Value Range   Squamous Epithelial / LPF MANY (*) RARE   WBC, UA 11-20  <3 WBC/hpf   RBC / HPF 7-10  <3 RBC/hpf   Bacteria, UA MANY (*) RARE  GLUCOSE, CAPILLARY     Status: None   Collection Time    04/22/13 10:53 AM      Result Value Range   Glucose-Capillary 93  70 - 99 mg/dL  CBC     Status: Abnormal   Collection Time    04/22/13 11:15 AM      Result Value Range   WBC 13.6 (*) 4.0 - 10.5 K/uL   RBC 3.93  3.87 - 5.11 MIL/uL   Hemoglobin 11.8 (*) 12.0 - 15.0 g/dL   HCT 16.1 (*) 09.6 - 04.5 %   MCV 87.8  78.0 - 100.0 fL   MCH 30.0  26.0 - 34.0 pg   MCHC 34.2  30.0 - 36.0 g/dL   RDW 40.9  81.1 - 91.4 %   Platelets 296  150 - 400 K/uL   Results for orders placed during the hospital encounter of 04/22/13 (from the past 24 hour(s))  URINALYSIS, ROUTINE W REFLEX MICROSCOPIC     Status: Abnormal   Collection Time    04/22/13 10:30 AM      Result Value Range   Color, Urine YELLOW  YELLOW   APPearance CLOUDY (*) CLEAR   Specific Gravity, Urine >1.030 (*) 1.005 - 1.030   pH 6.0  5.0 - 8.0   Glucose, UA 100 (*) NEGATIVE mg/dL   Hgb urine dipstick TRACE (*) NEGATIVE   Bilirubin Urine NEGATIVE  NEGATIVE   Ketones, ur 15 (*) NEGATIVE mg/dL   Protein, ur 30 (*) NEGATIVE mg/dL   Urobilinogen, UA 2.0 (*) 0.0 - 1.0 mg/dL   Nitrite NEGATIVE  NEGATIVE   Leukocytes, UA MODERATE (*) NEGATIVE  URINE MICROSCOPIC-ADD ON     Status: Abnormal   Collection Time    04/22/13 10:30 AM      Result Value Range   Squamous Epithelial / LPF MANY (*) RARE   WBC, UA 11-20  <3 WBC/hpf   RBC / HPF 7-10  <3 RBC/hpf   Bacteria, UA MANY (*) RARE  GLUCOSE, CAPILLARY     Status: None   Collection Time    04/22/13 10:53 AM      Result Value Range   Glucose-Capillary 93  70 -  99 mg/dL  CBC     Status: Abnormal   Collection Time    04/22/13 11:15 AM      Result Value Range   WBC 13.6 (*) 4.0 - 10.5 K/uL   RBC 3.93  3.87 - 5.11 MIL/uL    Hemoglobin 11.8 (*) 12.0 - 15.0 g/dL   HCT 16.1 (*) 09.6 - 04.5 %   MCV 87.8  78.0 - 100.0 fL   MCH 30.0  26.0 - 34.0 pg   MCHC 34.2  30.0 - 36.0 g/dL   RDW 40.9  81.1 - 91.4 %   Platelets 296  150 - 400 K/uL  COMPREHENSIVE METABOLIC PANEL     Status: Abnormal   Collection Time    04/22/13 11:15 AM      Result Value Range   Sodium 133 (*) 135 - 145 mEq/L   Potassium 3.3 (*) 3.5 - 5.1 mEq/L   Chloride 101  96 - 112 mEq/L   CO2 20  19 - 32 mEq/L   Glucose, Bld 97  70 - 99 mg/dL   BUN 5 (*) 6 - 23 mg/dL   Creatinine, Ser 7.82 (*) 0.50 - 1.10 mg/dL   Calcium 8.8  8.4 - 95.6 mg/dL   Total Protein 6.4  6.0 - 8.3 g/dL   Albumin 2.8 (*) 3.5 - 5.2 g/dL   AST 13  0 - 37 U/L   ALT 10  0 - 35 U/L   Alkaline Phosphatase 62  39 - 117 U/L   Total Bilirubin 0.5  0.3 - 1.2 mg/dL   GFR calc non Af Amer >90  >90 mL/min   GFR calc Af Amer >90  >90 mL/min  discussed with Dr. Jackelyn Knife - will have office refer for diabetic counseling Assessment and Plan  Diabetes Mellitus- uncontrolled- educational material given in discharge instructions including Diet recommendations Pregnancy second trimester Nausea- Zofran 8mg  ODT prescription GERD information F/u in the office as scheduled- has an appointment Monday  LINEBERRY,SUSAN 04/22/2013, 10:50 AM

## 2013-04-23 LAB — URINE CULTURE: Colony Count: 75000

## 2013-05-02 ENCOUNTER — Encounter (HOSPITAL_COMMUNITY): Payer: Self-pay | Admitting: *Deleted

## 2013-05-02 ENCOUNTER — Emergency Department (HOSPITAL_COMMUNITY)
Admission: EM | Admit: 2013-05-02 | Discharge: 2013-05-02 | Disposition: A | Discharge status: Home / Self Care | Payer: Medicaid Other | Attending: Emergency Medicine | Admitting: Emergency Medicine

## 2013-05-02 DIAGNOSIS — O99330 Smoking (tobacco) complicating pregnancy, unspecified trimester: Secondary | ICD-10-CM | POA: Insufficient documentation

## 2013-05-02 DIAGNOSIS — Z8679 Personal history of other diseases of the circulatory system: Secondary | ICD-10-CM | POA: Insufficient documentation

## 2013-05-02 DIAGNOSIS — Z8619 Personal history of other infectious and parasitic diseases: Secondary | ICD-10-CM | POA: Insufficient documentation

## 2013-05-02 DIAGNOSIS — Z79899 Other long term (current) drug therapy: Secondary | ICD-10-CM | POA: Insufficient documentation

## 2013-05-02 DIAGNOSIS — O9989 Other specified diseases and conditions complicating pregnancy, childbirth and the puerperium: Secondary | ICD-10-CM | POA: Insufficient documentation

## 2013-05-02 DIAGNOSIS — O24919 Unspecified diabetes mellitus in pregnancy, unspecified trimester: Secondary | ICD-10-CM | POA: Insufficient documentation

## 2013-05-02 NOTE — Progress Notes (Signed)
Called to [facility] to have RN adjust monitor to trace FHR.
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
## 2013-05-02 NOTE — ED Notes (Signed)
Rapid Response nurse at [facility], Victorino Dike, notified that patient is up to be discharged by Dr. Colon Branch. Victorino Dike reports no contractions were seen and baby's heart rate was WNL during monitoring.
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
## 2013-05-02 NOTE — ED Notes (Signed)
Pt placed on monitor.  Spoke with rapid response, pt to be monitored by [facility] hospital.

## 2013-05-02 NOTE — Progress Notes (Signed)
AP RN called. Patient to be discharged from [facility]. Monitors removed.

## 2013-05-14 ENCOUNTER — Ambulatory Visit: Payer: Medicaid Other | Admitting: *Deleted

## 2013-07-08 ENCOUNTER — Encounter (HOSPITAL_COMMUNITY): Payer: Self-pay

## 2013-07-08 ENCOUNTER — Inpatient Hospital Stay (HOSPITAL_COMMUNITY)
Admission: AD | Admit: 2013-07-08 | Discharge: 2013-07-08 | Disposition: A | Discharge status: Home / Self Care | Payer: Medicaid Other | Source: Ambulatory Visit | Attending: Obstetrics and Gynecology | Admitting: Obstetrics and Gynecology

## 2013-07-08 DIAGNOSIS — A5901 Trichomonal vulvovaginitis: Secondary | ICD-10-CM | POA: Insufficient documentation

## 2013-07-08 DIAGNOSIS — R109 Unspecified abdominal pain: Secondary | ICD-10-CM | POA: Insufficient documentation

## 2013-07-08 DIAGNOSIS — O98819 Other maternal infectious and parasitic diseases complicating pregnancy, unspecified trimester: Secondary | ICD-10-CM | POA: Insufficient documentation

## 2013-07-08 DIAGNOSIS — A599 Trichomoniasis, unspecified: Secondary | ICD-10-CM

## 2013-07-08 LAB — URINALYSIS, ROUTINE W REFLEX MICROSCOPIC
Bilirubin Urine: NEGATIVE
Ketones, ur: 15 mg/dL — AB
Specific Gravity, Urine: 1.025 (ref 1.005–1.030)
Urobilinogen, UA: 0.2 mg/dL (ref 0.0–1.0)

## 2013-07-08 LAB — WET PREP, GENITAL

## 2013-07-08 LAB — URINE MICROSCOPIC-ADD ON

## 2013-07-08 MED ORDER — METRONIDAZOLE 500 MG PO TABS
2000.0000 mg | ORAL_TABLET | Freq: Once | ORAL | Status: AC
  Administered 2013-07-08: 2000 mg via ORAL
  Filled 2013-07-08: qty 4

## 2013-07-08 NOTE — MAU Note (Signed)
Patient states she has had an increasing vaginal discharge for about one week. Yesterday made panties wet and had a spot of blood in mucus. Has been having abdominal pain and diarrhea started about 0400. Some nausea, no vomiting. Reports fetal movement.

## 2013-07-08 NOTE — MAU Provider Note (Signed)
History     CSN: [id number]  Arrival date and time: 07/08/13 1004   First Provider Initiated Contact with Patient 07/08/13 1042      Chief Complaint  Patient presents with  . Vaginal Discharge  . Abdominal Pain   HPI [name] is a 27 y.o. G2P1001 at [redacted]w[redacted]d who presents to MAU today with complaint of ?LOF x 2 days and lower abdominal pain since this morning. The patient states that she awoke with severe pain at 0430 today and then had a loose BM. She noted mucus and scant blood with wiping that has now resolved. The reports good fetal movement. She denies UTI symptoms. She has type II DM and is on insulin.    OB History   Grav Para Term Preterm Abortions TAB SAB Ect Mult Living   2 1 1       1       Past Medical History  Diagnosis Date  . Diabetes mellitus without complication     Diagnosed in 2009; no meds   . Migraine   . History of trichomoniasis     Past Surgical History  Procedure Laterality Date  . Cesarean section    . Dental surgery      Family History  Problem Relation Age of Onset  . Diabetes Mother   . Hypertension Mother   . Diabetes Father   . Birth defects Sister     heart was on wrong side  . Cancer Maternal Grandmother     History  Substance Use Topics  . Smoking status: Current Some Day Smoker -- 0.50 packs/day  . Smokeless tobacco: Not on file  . Alcohol Use: No     Comment: Occasional    Allergies: No Known Allergies  No prescriptions prior to admission    Review of Systems  Constitutional: Negative for fever and malaise/fatigue.  Gastrointestinal: Positive for nausea, abdominal pain and diarrhea. Negative for vomiting and constipation.  Genitourinary: Negative for dysuria, urgency and frequency.       + vaginal discharge, bleeding   Physical Exam   Blood pressure 129/77, pulse 89, temperature 98.8 F (37.1 C), temperature source Oral, resp. rate 16, height 5\' 4"  (1.626 m), weight 185 lb 3.2 oz (84.006 kg), last  menstrual period 11/02/2012, SpO2 100.00%.  Physical Exam  Constitutional: She is oriented to person, place, and time. She appears well-developed and well-nourished. No distress.  HENT:  Head: Normocephalic and atraumatic.  Cardiovascular: Normal rate, regular rhythm and normal heart sounds.   Respiratory: Effort normal and breath sounds normal. No respiratory distress.  GI: Soft. Bowel sounds are normal. She exhibits no distension and no mass. There is no tenderness. There is no rebound and no guarding.  Genitourinary: Uterus is enlarged (gravid). Uterus is not tender. Cervix exhibits no motion tenderness, no discharge and no friability. No bleeding around the vagina. Vaginal discharge (small amount of frothy white-yellow discharge noted) found.  Neurological: She is alert and oriented to person, place, and time.  Skin: Skin is warm and dry. No erythema.  Psychiatric: She has a normal mood and affect.   Results for orders placed during the hospital encounter of 07/08/13 (from the past 24 hour(s))  URINALYSIS, ROUTINE W REFLEX MICROSCOPIC     Status: Abnormal   Collection Time    07/08/13 10:25 AM      Result Value Range   Color, Urine YELLOW  YELLOW   APPearance CLOUDY (*) CLEAR   Specific Gravity, Urine 1.025  1.005 - 1.030   pH 6.0  5.0 - 8.0   Glucose, UA 500 (*) NEGATIVE mg/dL   Hgb urine dipstick MODERATE (*) NEGATIVE   Bilirubin Urine NEGATIVE  NEGATIVE   Ketones, ur 15 (*) NEGATIVE mg/dL   Protein, ur 30 (*) NEGATIVE mg/dL   Urobilinogen, UA 0.2  0.0 - 1.0 mg/dL   Nitrite NEGATIVE  NEGATIVE   Leukocytes, UA MODERATE (*) NEGATIVE  URINE MICROSCOPIC-ADD ON     Status: Abnormal   Collection Time    07/08/13 10:25 AM      Result Value Range   Squamous Epithelial / LPF MANY (*) RARE   WBC, UA 7-10  <3 WBC/hpf   RBC / HPF 7-10  <3 RBC/hpf   Bacteria, UA RARE  RARE   Urine-Other TRICHOMONAS PRESENT    WET PREP, GENITAL     Status: Abnormal   Collection Time    07/08/13  11:00 AM      Result Value Range   Yeast Wet Prep HPF POC NONE SEEN  NONE SEEN   Trich, Wet Prep TOO NUMEROUS TO COUNT (*) NONE SEEN   Clue Cells Wet Prep HPF POC FEW (*) NONE SEEN   WBC, Wet Prep HPF POC TOO NUMEROUS TO COUNT (*) NONE SEEN   Fetal Monitoring: Baseline: 135 bpm, moderate variability, + accelerations, no decelerations Contractions: none MAU Course  Procedures None  MDM Discussed with Dr. Senaida Ores. Treat wet prep results. Follow-up as scheduled this week.  Discussed diagnosis of Trichomonas. Patient denies sexual contact in 2+ months. Patient states that she has been treated for Trichomonas twice previously in this pregnancy.  2 G Flagyl given in MAU Assessment and Plan  A: Trichomonas Abdominal pain in pregnancy, antepartum  P: Discharge home Patient advised of partner treatment Patient encouraged to increase PO hydration Patient advised to follow-up with [facility] as scheduled Patient may return to MAU as needed  Evelyn Starr, PA-C  07/08/2013, 2:26 PM

## 2013-07-08 NOTE — MAU Note (Signed)
Patient is in with concerns of having mucus/"sticky" vaginal discharge and had woke with her underwear wet. She states that she voiced her concern with her physician about discharge last week, she was told that it was normal. She c/o that she had loose stools this morning and nause (no vomiting). She denies vaginal bleeding. Patient states that she have intermittent abdominal pain (she states that she is not sure if its contractions). She have a scheduled repeat c-section 10/14. She reports good fetal movement.

## 2013-07-28 ENCOUNTER — Encounter (HOSPITAL_COMMUNITY): Payer: Self-pay | Admitting: Pharmacist

## 2013-07-31 NOTE — Patient Instructions (Addendum)
Your procedure is scheduled on: 08/04/2013 Tuesday  Enter through the Main Entrance of [facility] at: 0600AM  Pick up the phone at the desk and dial [phone].  Call this number if you have problems the morning of surgery: [phone].  Remember: Do NOT eat food: AFTER MIDNIGHT 08/03/2013 Monday Do NOT drink clear liquids after: AFTER MIDNIGHT 08/03/2013  Monday Take these medicines the morning of surgery with a SIP OF WATER:   Humalog normal dose tonight and 1/2 Humulin N dose (25 units tonight)  Do NOT wear jewelry (body piercing), make-up, or nail polish. Do NOT wear lotions, powders, or perfumes.  You may wear deoderant. Do NOT shave for 48 hours prior to surgery. Do NOT bring valuables to the hospital. Contacts, dentures, or bridgework may not be worn into surgery. Leave suitcase in car.  After surgery it may be brought to your room.  For patients admitted to the hospital, checkout time is 11:00 AM the day of discharge.  Home with mother Evelyn [name] [phone].

## 2013-08-03 ENCOUNTER — Encounter (HOSPITAL_COMMUNITY)
Admission: RE | Admit: 2013-08-03 | Discharge: 2013-08-03 | Disposition: A | Discharge status: Home / Self Care | Payer: Medicaid Other | Source: Ambulatory Visit | Attending: Obstetrics and Gynecology | Admitting: Obstetrics and Gynecology

## 2013-08-03 ENCOUNTER — Encounter (HOSPITAL_COMMUNITY): Payer: Self-pay

## 2013-08-03 HISTORY — DX: Other specified pregnancy related conditions, unspecified trimester: O26.899

## 2013-08-03 HISTORY — DX: Heartburn: R12

## 2013-08-03 LAB — CBC
HCT: 34.2 % — ABNORMAL LOW (ref 36.0–46.0)
MCHC: 32.7 g/dL (ref 30.0–36.0)
Platelets: 180 10*3/uL (ref 150–400)
RDW: 14.1 % (ref 11.5–15.5)
WBC: 9.5 10*3/uL (ref 4.0–10.5)

## 2013-08-03 LAB — TYPE AND SCREEN: Antibody Screen: NEGATIVE

## 2013-08-03 LAB — COMPREHENSIVE METABOLIC PANEL
ALT: 14 U/L (ref 0–35)
Albumin: 2.1 g/dL — ABNORMAL LOW (ref 3.5–5.2)
Alkaline Phosphatase: 235 U/L — ABNORMAL HIGH (ref 39–117)
Chloride: 101 mEq/L (ref 96–112)
Potassium: 4.2 mEq/L (ref 3.5–5.1)
Sodium: 135 mEq/L (ref 135–145)
Total Protein: 5.9 g/dL — ABNORMAL LOW (ref 6.0–8.3)

## 2013-08-03 NOTE — H&P (Signed)
Evelyn [name] is a 27 y.o. female G2P1001 at 44 2/7 weeks (EDD 08/09/14 by LMP c/w 13 week [place]) presenting for scheduled repeat c-section.  Prenatal care has been complicated by Type 2 diabetes which has been difficult to fully control.  The patient has had DM since her last delivery in 2009, but was non-compliant with medications (metformin) because it made her sick, and she really never checked her blood sugars.  She transferred to our practice at 13 weeks and was initially on glyburide.  Her blood sugars were never controlled and she was not compliant with bringing in her logs until about 23 weeks.  At that point she was taking glyburide BID with no control of the blood sugars so by 29 weeks she was converted to insulin. She has required increasing doses and just prior to delivery her regimen was 50 units BID of NPH and 8-10 units of humalog for meal coverage.  Her FBS did come down on this regimen but she did still have postprandials at times around 200.  We discussed on many occasions neonatal hypoglycemia and other complications of DM and pregnancy.  She had a normal fetal echo and her NST's were begun at 32 weeks and reassuring.  She did admit to some issues with depression and anxiety, but never started medications for those.  Maternal Medical History:  Fetal activity: Perceived fetal activity is normal.    Prenatal Complications - Diabetes: type 2.    OB History   Grav Para Term Preterm Abortions TAB SAB Ect Mult Living   2 1 1       1     2009 C-section 9#11oz  Past Medical History  Diagnosis Date  . Diabetes mellitus without complication     Diagnosed in 2009; no meds   . History of trichomoniasis   . Migraine     otc med prn  . Heartburn in pregnancy    Past Surgical History  Procedure Laterality Date  . Cesarean section  2009  . Dental surgery      wisdom teeth ext   Family History: family history includes Birth defects in her sister; Cancer in her maternal grandmother;  Diabetes in her father and mother; Hypertension in her mother. Social History:  reports that she quit smoking about 9 months ago. Her smoking use included Cigarettes. She has a 3 pack-year smoking history. She has never used smokeless tobacco. She reports that she does not drink alcohol or use illicit drugs.   Prenatal Transfer Tool  Maternal Diabetes: Yes:  Diabetes Type:  Pre-pregnancy--on insulin Genetic Screening: Normal Maternal Ultrasounds/Referrals: Normal Fetal Ultrasounds or other Referrals:  Fetal echo Maternal Substance Abuse:  No Significant Maternal Medications:  Meds include: Other:   Insulin Significant Maternal Lab Results:  Lab values include: Group B Strep positive Other Comments:  None  ROS    Last menstrual period 11/02/2012. Exam Physical Exam  Constitutional: She is oriented to person, place, and time. She appears well-developed and well-nourished.  Cardiovascular: Normal rate and regular rhythm.   Respiratory: Effort normal.  GI: Soft. Bowel sounds are normal.  Genitourinary:  Gravid Pfannenstiel scar  Neurological: She is alert and oriented to person, place, and time.  Psychiatric: She has a normal mood and affect. Her behavior is normal.    Prenatal labs: ABO, Rh: --/--/A POS (10/13 1025) Antibody: NEG (10/13 1025) Rubella: Nonimmune (02/20 0000) RPR: NON REACTIVE (10/13 1025)  HBsAg: Negative (02/20 0000)  HIV: Non-reactive (02/20 0000)  GBS:  Positive First trimester screen and AFP WNL  Assessment/Plan: Patient was counseled regarding the risks and benefits of C-section and the procedure was reviewed in detail. Risks of bleeding, infection, and possible damage to bowel and bladder were reviewed, The patient would accept a blood transfusion if needed. She desires to proceed.    Evelyn [name] 08/03/2013, 6:24 PM

## 2013-08-03 NOTE — Pre-Procedure Instructions (Signed)
Patient will be bottle feeding during her stay at the hospital.

## 2013-08-04 ENCOUNTER — Inpatient Hospital Stay (HOSPITAL_COMMUNITY): Payer: Medicaid Other | Admitting: Anesthesiology

## 2013-08-04 ENCOUNTER — Encounter (HOSPITAL_COMMUNITY): Payer: Medicaid Other | Admitting: Anesthesiology

## 2013-08-04 ENCOUNTER — Encounter (HOSPITAL_COMMUNITY)
Admission: RE | Disposition: A | Discharge status: Home / Self Care | Payer: Self-pay | Source: Ambulatory Visit | Attending: Obstetrics and Gynecology

## 2013-08-04 ENCOUNTER — Inpatient Hospital Stay (HOSPITAL_COMMUNITY)
Admission: RE | Admit: 2013-08-04 | Discharge: 2013-08-06 | DRG: 765 | Disposition: A | Discharge status: Home / Self Care | Payer: Medicaid Other | Source: Ambulatory Visit | Attending: Obstetrics and Gynecology | Admitting: Obstetrics and Gynecology

## 2013-08-04 ENCOUNTER — Encounter (HOSPITAL_COMMUNITY): Payer: Self-pay | Admitting: Anesthesiology

## 2013-08-04 DIAGNOSIS — Z22330 Carrier of Group B streptococcus: Secondary | ICD-10-CM

## 2013-08-04 DIAGNOSIS — O2432 Unspecified pre-existing diabetes mellitus in childbirth: Secondary | ICD-10-CM | POA: Diagnosis present

## 2013-08-04 DIAGNOSIS — E119 Type 2 diabetes mellitus without complications: Secondary | ICD-10-CM | POA: Diagnosis present

## 2013-08-04 DIAGNOSIS — Z98891 History of uterine scar from previous surgery: Secondary | ICD-10-CM

## 2013-08-04 DIAGNOSIS — O34219 Maternal care for unspecified type scar from previous cesarean delivery: Principal | ICD-10-CM | POA: Diagnosis present

## 2013-08-04 DIAGNOSIS — O99892 Other specified diseases and conditions complicating childbirth: Secondary | ICD-10-CM | POA: Diagnosis present

## 2013-08-04 SURGERY — Surgical Case
Anesthesia: Spinal | Site: Abdomen | Wound class: Clean Contaminated

## 2013-08-04 MED ORDER — PNEUMOCOCCAL VAC POLYVALENT 25 MCG/0.5ML IJ INJ
0.5000 mL | INJECTION | INTRAMUSCULAR | Status: AC
  Administered 2013-08-06: 0.5 mL via INTRAMUSCULAR
  Filled 2013-08-04 (×2): qty 0.5

## 2013-08-04 MED ORDER — NALBUPHINE HCL 10 MG/ML IJ SOLN
5.0000 mg | INTRAMUSCULAR | Status: DC | PRN
  Administered 2013-08-04: 10 mg via SUBCUTANEOUS
  Filled 2013-08-04 (×2): qty 1

## 2013-08-04 MED ORDER — INSULIN ASPART 100 UNIT/ML ~~LOC~~ SOLN: 0.0000 [IU] | SUBCUTANEOUS | Status: DC

## 2013-08-04 MED ORDER — LANOLIN HYDROUS EX OINT: 1.0000 "application " | TOPICAL_OINTMENT | CUTANEOUS | Status: DC | PRN

## 2013-08-04 MED ORDER — MEPERIDINE HCL 25 MG/ML IJ SOLN
INTRAMUSCULAR | Status: AC
  Filled 2013-08-04: qty 1

## 2013-08-04 MED ORDER — DIPHENHYDRAMINE HCL 50 MG/ML IJ SOLN: 12.5000 mg | INTRAMUSCULAR | Status: DC | PRN

## 2013-08-04 MED ORDER — SCOPOLAMINE 1 MG/3DAYS TD PT72
1.0000 | MEDICATED_PATCH | Freq: Once | TRANSDERMAL | Status: DC
  Administered 2013-08-04: 1.5 mg via TRANSDERMAL

## 2013-08-04 MED ORDER — METOCLOPRAMIDE HCL 5 MG/ML IJ SOLN: 10.0000 mg | Freq: Three times a day (TID) | INTRAMUSCULAR | Status: DC | PRN

## 2013-08-04 MED ORDER — KETOROLAC TROMETHAMINE 30 MG/ML IJ SOLN: 30.0000 mg | Freq: Four times a day (QID) | INTRAMUSCULAR | Status: AC | PRN

## 2013-08-04 MED ORDER — SCOPOLAMINE 1 MG/3DAYS TD PT72
MEDICATED_PATCH | TRANSDERMAL | Status: AC
  Administered 2013-08-04: 1.5 mg via TRANSDERMAL
  Filled 2013-08-04: qty 1

## 2013-08-04 MED ORDER — ONDANSETRON HCL 4 MG PO TABS: 4.0000 mg | ORAL_TABLET | ORAL | Status: DC | PRN

## 2013-08-04 MED ORDER — OXYTOCIN 10 UNIT/ML IJ SOLN
INTRAMUSCULAR | Status: AC
  Filled 2013-08-04: qty 4

## 2013-08-04 MED ORDER — MORPHINE SULFATE (PF) 0.5 MG/ML IJ SOLN
INTRAMUSCULAR | Status: DC | PRN
  Administered 2013-08-04: .1 mg via INTRATHECAL

## 2013-08-04 MED ORDER — BUPIVACAINE IN DEXTROSE 0.75-8.25 % IT SOLN
INTRATHECAL | Status: DC | PRN
  Administered 2013-08-04: 1.4 mL via INTRATHECAL

## 2013-08-04 MED ORDER — MORPHINE SULFATE 0.5 MG/ML IJ SOLN
INTRAMUSCULAR | Status: AC
  Filled 2013-08-04: qty 10

## 2013-08-04 MED ORDER — SIMETHICONE 80 MG PO CHEW
80.0000 mg | CHEWABLE_TABLET | ORAL | Status: DC
  Administered 2013-08-05: 80 mg via ORAL
  Filled 2013-08-04 (×2): qty 1

## 2013-08-04 MED ORDER — PHENYLEPHRINE HCL 10 MG/ML IJ SOLN
20.0000 mg | INTRAVENOUS | Status: DC | PRN
  Administered 2013-08-04: 60 ug/min via INTRAVENOUS

## 2013-08-04 MED ORDER — MEPERIDINE HCL 25 MG/ML IJ SOLN: 6.2500 mg | INTRAMUSCULAR | Status: DC | PRN

## 2013-08-04 MED ORDER — ZOLPIDEM TARTRATE 5 MG PO TABS: 5.0000 mg | ORAL_TABLET | Freq: Every evening | ORAL | Status: DC | PRN

## 2013-08-04 MED ORDER — FENTANYL CITRATE 0.05 MG/ML IJ SOLN: 25.0000 ug | INTRAMUSCULAR | Status: DC | PRN

## 2013-08-04 MED ORDER — TETANUS-DIPHTH-ACELL PERTUSSIS 5-2.5-18.5 LF-MCG/0.5 IM SUSP: 0.5000 mL | Freq: Once | INTRAMUSCULAR | Status: DC

## 2013-08-04 MED ORDER — LACTATED RINGERS IV SOLN
INTRAVENOUS | Status: DC
  Administered 2013-08-04: 18:00:00 via INTRAVENOUS

## 2013-08-04 MED ORDER — DIPHENHYDRAMINE HCL 25 MG PO CAPS: 25.0000 mg | ORAL_CAPSULE | Freq: Four times a day (QID) | ORAL | Status: DC | PRN

## 2013-08-04 MED ORDER — FENTANYL CITRATE 0.05 MG/ML IJ SOLN
INTRAMUSCULAR | Status: DC | PRN
  Administered 2013-08-04: 15 ug via INTRATHECAL

## 2013-08-04 MED ORDER — SIMETHICONE 80 MG PO CHEW: 80.0000 mg | CHEWABLE_TABLET | ORAL | Status: DC | PRN

## 2013-08-04 MED ORDER — ONDANSETRON HCL 4 MG/2ML IJ SOLN
INTRAMUSCULAR | Status: DC | PRN
  Administered 2013-08-04: 4 mg via INTRAMUSCULAR

## 2013-08-04 MED ORDER — ONDANSETRON HCL 4 MG/2ML IJ SOLN
INTRAMUSCULAR | Status: AC
  Filled 2013-08-04: qty 2

## 2013-08-04 MED ORDER — CEFAZOLIN SODIUM-DEXTROSE 2-3 GM-% IV SOLR
INTRAVENOUS | Status: AC
  Filled 2013-08-04: qty 50

## 2013-08-04 MED ORDER — MENTHOL 3 MG MT LOZG: 1.0000 | LOZENGE | OROMUCOSAL | Status: DC | PRN

## 2013-08-04 MED ORDER — NALBUPHINE HCL 10 MG/ML IJ SOLN
5.0000 mg | INTRAMUSCULAR | Status: DC | PRN
  Administered 2013-08-04: 5 mg via INTRAVENOUS
  Filled 2013-08-04 (×2): qty 1

## 2013-08-04 MED ORDER — SIMETHICONE 80 MG PO CHEW
80.0000 mg | CHEWABLE_TABLET | Freq: Three times a day (TID) | ORAL | Status: DC
  Administered 2013-08-04 – 2013-08-05 (×6): 80 mg via ORAL
  Filled 2013-08-04 (×6): qty 1

## 2013-08-04 MED ORDER — DIBUCAINE 1 % RE OINT: 1.0000 "application " | TOPICAL_OINTMENT | RECTAL | Status: DC | PRN

## 2013-08-04 MED ORDER — ONDANSETRON HCL 4 MG/2ML IJ SOLN: 4.0000 mg | Freq: Three times a day (TID) | INTRAMUSCULAR | Status: DC | PRN

## 2013-08-04 MED ORDER — PHENYLEPHRINE 8 MG IN D5W 100 ML (0.08MG/ML) PREMIX OPTIME: INJECTION | INTRAVENOUS | Status: DC | PRN

## 2013-08-04 MED ORDER — SCOPOLAMINE 1 MG/3DAYS TD PT72: 1.0000 | MEDICATED_PATCH | Freq: Once | TRANSDERMAL | Status: DC

## 2013-08-04 MED ORDER — ONDANSETRON HCL 4 MG/2ML IJ SOLN: 4.0000 mg | INTRAMUSCULAR | Status: DC | PRN

## 2013-08-04 MED ORDER — NALOXONE HCL 0.4 MG/ML IJ SOLN: 0.4000 mg | INTRAMUSCULAR | Status: DC | PRN

## 2013-08-04 MED ORDER — IBUPROFEN 600 MG PO TABS
600.0000 mg | ORAL_TABLET | Freq: Four times a day (QID) | ORAL | Status: DC
  Administered 2013-08-04 – 2013-08-06 (×7): 600 mg via ORAL
  Filled 2013-08-04 (×7): qty 1

## 2013-08-04 MED ORDER — CEFAZOLIN SODIUM-DEXTROSE 2-3 GM-% IV SOLR: 2.0000 g | INTRAVENOUS | Status: DC

## 2013-08-04 MED ORDER — OXYTOCIN 10 UNIT/ML IJ SOLN
40.0000 [IU] | INTRAVENOUS | Status: DC | PRN
  Administered 2013-08-04: 40 [IU] via INTRAVENOUS

## 2013-08-04 MED ORDER — SENNOSIDES-DOCUSATE SODIUM 8.6-50 MG PO TABS
2.0000 | ORAL_TABLET | ORAL | Status: DC
  Administered 2013-08-05 (×2): 2 via ORAL
  Filled 2013-08-04 (×2): qty 2

## 2013-08-04 MED ORDER — WITCH HAZEL-GLYCERIN EX PADS: 1.0000 "application " | MEDICATED_PAD | CUTANEOUS | Status: DC | PRN

## 2013-08-04 MED ORDER — KETOROLAC TROMETHAMINE 60 MG/2ML IM SOLN
60.0000 mg | Freq: Once | INTRAMUSCULAR | Status: AC | PRN
  Administered 2013-08-04: 60 mg via INTRAMUSCULAR

## 2013-08-04 MED ORDER — FENTANYL CITRATE 0.05 MG/ML IJ SOLN
INTRAMUSCULAR | Status: AC
  Filled 2013-08-04: qty 2

## 2013-08-04 MED ORDER — KETOROLAC TROMETHAMINE 60 MG/2ML IM SOLN
INTRAMUSCULAR | Status: AC
  Administered 2013-08-04: 60 mg via INTRAMUSCULAR
  Filled 2013-08-04: qty 2

## 2013-08-04 MED ORDER — SODIUM CHLORIDE 0.9 % IJ SOLN: 3.0000 mL | INTRAMUSCULAR | Status: DC | PRN

## 2013-08-04 MED ORDER — OXYCODONE-ACETAMINOPHEN 5-325 MG PO TABS
1.0000 | ORAL_TABLET | ORAL | Status: DC | PRN
  Administered 2013-08-04: 1 via ORAL
  Administered 2013-08-05 – 2013-08-06 (×5): 2 via ORAL
  Filled 2013-08-04 (×4): qty 2
  Filled 2013-08-04: qty 1
  Filled 2013-08-04: qty 2

## 2013-08-04 MED ORDER — MEPERIDINE HCL 25 MG/ML IJ SOLN
INTRAMUSCULAR | Status: DC | PRN
  Administered 2013-08-04 (×2): 12.5 mg via INTRAVENOUS

## 2013-08-04 MED ORDER — LACTATED RINGERS IV SOLN
INTRAVENOUS | Status: DC
  Administered 2013-08-04 (×3): via INTRAVENOUS

## 2013-08-04 MED ORDER — LACTATED RINGERS IV SOLN
INTRAVENOUS | Status: DC
  Administered 2013-08-04: 08:00:00 via INTRAVENOUS

## 2013-08-04 MED ORDER — NALOXONE HCL 1 MG/ML IJ SOLN
1.0000 ug/kg/h | INTRAVENOUS | Status: DC | PRN
  Filled 2013-08-04: qty 2

## 2013-08-04 MED ORDER — DIPHENHYDRAMINE HCL 25 MG PO CAPS
25.0000 mg | ORAL_CAPSULE | ORAL | Status: DC | PRN
  Filled 2013-08-04: qty 1

## 2013-08-04 MED ORDER — CEFAZOLIN SODIUM-DEXTROSE 2-3 GM-% IV SOLR
2.0000 g | INTRAVENOUS | Status: AC
  Administered 2013-08-04: 2 g via INTRAVENOUS

## 2013-08-04 MED ORDER — LACTATED RINGERS IV SOLN: INTRAVENOUS | Status: DC

## 2013-08-04 MED ORDER — OXYTOCIN 40 UNITS IN LACTATED RINGERS INFUSION - SIMPLE MED: 62.5000 mL/h | INTRAVENOUS | Status: AC

## 2013-08-04 MED ORDER — PRENATAL MULTIVITAMIN CH
1.0000 | ORAL_TABLET | Freq: Every day | ORAL | Status: DC
  Administered 2013-08-04 – 2013-08-05 (×2): 1 via ORAL
  Filled 2013-08-04 (×2): qty 1

## 2013-08-04 MED ORDER — DIPHENHYDRAMINE HCL 50 MG/ML IJ SOLN: 25.0000 mg | INTRAMUSCULAR | Status: DC | PRN

## 2013-08-04 SURGICAL SUPPLY — 26 items
APL SKNCLS STERI-STRIP NONHPOA (GAUZE/BANDAGES/DRESSINGS) ×1
BENZOIN TINCTURE PRP APPL 2/3 (GAUZE/BANDAGES/DRESSINGS) ×1 IMPLANT
CLAMP CORD UMBIL (MISCELLANEOUS) ×1 IMPLANT
CLOTH BEACON ORANGE TIMEOUT ST (SAFETY) ×2 IMPLANT
DRAPE LG THREE QUARTER DISP (DRAPES) ×4 IMPLANT
DRSG OPSITE POSTOP 4X10 (GAUZE/BANDAGES/DRESSINGS) ×2 IMPLANT
DURAPREP 26ML APPLICATOR (WOUND CARE) ×2 IMPLANT
ELECT REM PT RETURN 9FT ADLT (ELECTROSURGICAL) ×2
ELECTRODE REM PT RTRN 9FT ADLT (ELECTROSURGICAL) ×1 IMPLANT
GLOVE BIO SURGEON STRL SZ 6.5 (GLOVE) ×2 IMPLANT
GOWN PREVENTION PLUS XLARGE (GOWN DISPOSABLE) ×4 IMPLANT
GOWN STRL REIN XL XLG (GOWN DISPOSABLE) ×4 IMPLANT
NS IRRIG 1000ML POUR BTL (IV SOLUTION) ×2 IMPLANT
PACK C SECTION WH (CUSTOM PROCEDURE TRAY) ×2 IMPLANT
PAD OB MATERNITY 4.3X12.25 (PERSONAL CARE ITEMS) ×2 IMPLANT
RTRCTR C-SECT PINK 25CM LRG (MISCELLANEOUS) ×2 IMPLANT
STRIP CLOSURE SKIN 1/2X4 (GAUZE/BANDAGES/DRESSINGS) ×1 IMPLANT
SUT CHROMIC 1 CTX 36 (SUTURE) ×4 IMPLANT
SUT VIC AB 0 CT1 27 (SUTURE) ×4
SUT VIC AB 0 CT1 27XBRD ANBCTR (SUTURE) ×2 IMPLANT
SUT VIC AB 2-0 CT1 27 (SUTURE) ×2
SUT VIC AB 2-0 CT1 TAPERPNT 27 (SUTURE) IMPLANT
SUT VIC AB 4-0 KS 27 (SUTURE) IMPLANT
TOWEL OR 17X24 6PK STRL BLUE (TOWEL DISPOSABLE) ×2 IMPLANT
TRAY FOLEY CATH 14FR (SET/KITS/TRAYS/PACK) ×2 IMPLANT
WATER STERILE IRR 1000ML POUR (IV SOLUTION) ×2 IMPLANT

## 2013-08-04 NOTE — Anesthesia Preprocedure Evaluation (Signed)
Anesthesia Evaluation  Patient identified by MRN, date of birth, ID band Patient awake    Reviewed: Allergy & Precautions, H&P , NPO status , Patient's Chart, lab work & pertinent test results  Airway Mallampati: I TM Distance: >3 FB Neck ROM: full    Dental no notable dental hx.    Pulmonary neg pulmonary ROS,  breath sounds clear to auscultation  Pulmonary exam normal       Cardiovascular negative cardio ROS      Neuro/Psych negative neurological ROS  negative psych ROS   GI/Hepatic negative GI ROS, Neg liver ROS,   Endo/Other  negative endocrine ROS  Renal/GU negative Renal ROS  negative genitourinary   Musculoskeletal negative musculoskeletal ROS (+)   Abdominal Normal abdominal exam  (+)   Peds  Hematology negative hematology ROS (+)   Anesthesia Other Findings   Reproductive/Obstetrics (+) Pregnancy                           Anesthesia Physical Anesthesia Plan  ASA: II  Anesthesia Plan: Spinal   Post-op Pain Management:    Induction:   Airway Management Planned:   Additional Equipment:   Intra-op Plan:   Post-operative Plan:   Informed Consent: I have reviewed the patients History and Physical, chart, labs and discussed the procedure including the risks, benefits and alternatives for the proposed anesthesia with the patient or authorized representative who has indicated his/her understanding and acceptance.     Plan Discussed with: CRNA and Surgeon  Anesthesia Plan Comments:         Anesthesia Quick Evaluation

## 2013-08-04 NOTE — Anesthesia Postprocedure Evaluation (Signed)
  Anesthesia Post-op Note  Patient: Evelyn [name]  Procedure(s) Performed: Procedure(s): REPEAT CESAREAN SECTION (N/A)  Patient Location: Mother/Baby  Anesthesia Type:Spinal  Level of Consciousness: awake, alert  and oriented  Airway and Oxygen Therapy: Patient Spontanous Breathing  Post-op Pain: none  Post-op Assessment: Post-op Vital signs reviewed and Patient's Cardiovascular Status Stable  Post-op Vital Signs: Reviewed and stable  Complications: No apparent anesthesia complications

## 2013-08-04 NOTE — Anesthesia Postprocedure Evaluation (Signed)
  Anesthesia Post-op Note  Anesthesia Post Note  Patient: Evelyn [name]  Procedure(s) Performed: Procedure(s) (LRB): REPEAT CESAREAN SECTION (N/A)  Anesthesia type: Spinal  Patient location: PACU  Post pain: Pain level controlled  Post assessment: Post-op Vital signs reviewed  Last Vitals:  Filed Vitals:   08/04/13 1020  BP: 136/68  Pulse: 70  Temp: 36.7 C  Resp: 14    Post vital signs: Reviewed  Level of consciousness: awake  Complications: No apparent anesthesia complications

## 2013-08-04 NOTE — Transfer of Care (Signed)
Immediate Anesthesia Transfer of Care Note  Patient: Evelyn [name]  Procedure(s) Performed: Procedure(s): REPEAT CESAREAN SECTION (N/A)  Patient Location: PACU  Anesthesia Type:Spinal  Level of Consciousness: awake, alert , oriented and patient cooperative  Airway & Oxygen Therapy: Patient Spontanous Breathing  Post-op Assessment: Report given to PACU RN and Post -op Vital signs reviewed and stable  Post vital signs: stable  Complications: No apparent anesthesia complications

## 2013-08-04 NOTE — Progress Notes (Signed)
Patient ID: Evelyn [name], female   DOB: 04-22-1986, 27 y.o.   MRN: [id number] Per pt no changes in dictated H&P.  Brief exam WNL.  BS this AM 88.

## 2013-08-04 NOTE — Op Note (Signed)
Operative report  Preoperative diagnosis Term pregnancy at 39 weeks and 2 days Prior C-section declines trial of labor Type 2 diabetes mellitus  Postoperative diagnosis Same  Procedure Repeat low transverse cesarean section with 2 layer closure of uterus  Surgeon Dr. Huel Cote Dr. Tawanna Cooler Meisinger  Anesthesia Spinal  Fluids Estimated blood loss 800 cc Urine output 150 cc clear urine IV fluids 2200 cc LR  Findings There was a viable female infant in the vertex presentation. Apgars were 8 and 9. Weight pending at time of dictation. The ovaries and tubes appeared normal. The uterus was normal except for a very thin lower uterine segment.  Specimen Placenta was sent to L&D after cord blood collection  Procedure note Patient was taken to the operating room where spinal anesthesia was obtained without difficulty by Dr. Dana Allan. She was prepped and draped in the normal sterile fashion in the dorsal supine position with leftward tilt. An appropriate time out was performed. Allis clamp test confirmed adequate anesthesia and a Foley catheter was in place. A Pfannenstiel skin incision was then made through pre-existing scar and carried through to underlying layer of fascia by sharp dissection and Bovie cautery. The fascia was nicked in the midline and the incision extended laterally. The superior aspect of the fashion was elevated and dissected off the underlying rectus muscles. The rectus muscles were then separated in the midline and the peritoneal cavity entered sharply. The peritoneal incision was then extended both superiorly and inferiorly with careful attention to avoid both bowel and bladder. The Alexis self-retaining wound retractor was then placed within the incision the lower uterine segment exposed. Will uterine segment was then incised in a transverse fashion and was noted to be quite thin. The infant's head was then delivered atraumatically and bulb suctioned a loose  nuchal cord x1 was reduced. The remainder of the infant's body delivered without difficulty and the cord was clamped cut with the infant handed to the waiting pediatricians. The placenta was then spontaneously expressed and handed off for cord blood donation. The uterus was cleared of all clots and debris with moist lap sponge. The uterine incision was then closed in 2 layers the first a running locked layer 1-0 chromic the second an imbricating layer of the same suture. All appeared hemostatic the gutters were cleared of all clots and debris and the tubes and ovaries were inspected. No bleeding was noted at the incision so all instruments and sponges removed from the abdomen as well as the Alexis retractor. The rectus muscles and peritoneum were reapproximated with several interrupted sutures of 2-0 Vicryl. The fascia was then closed with 0 Vicryl in a running fashion. The subcutaneous tissue was reapproximated with 3-0 Vicryl in a running fashion and the skin was closed with a subcuticular stitch of 3-0 Vicryl on a Keith needle. The incision was reinforced with benzoin and Steri-Strips. Patient was then taken to the recovery room in good condition with the baby accompanying her.

## 2013-08-04 NOTE — Brief Op Note (Signed)
08/04/2013  8:47 AM  PATIENT:  Evelyn [name]  27 y.o. female  PRE-OPERATIVE DIAGNOSIS:  Repeat Cesarean Section  POST-OPERATIVE DIAGNOSIS:  Repeat Cesarean Section  PROCEDURE:  Procedure(s): REPEAT CESAREAN SECTION (N/A)  SURGEON:  Surgeon(s) and Role:    * Oliver Pila, MD - Primary    * Lavina Hamman, MD - Assisting  PHYSICIAN ASSISTANT:    ANESTHESIA:   spinal  EBL:  Total I/O In: 2800 [I.V.:2800] Out: 900 [Urine:300; Blood:600]  BLOOD ADMINISTERED:none  DRAINS: Urinary Catheter (Foley)   LOCAL MEDICATIONS USED:  NONE  SPECIMEN:  Placenta  DISPOSITION OF SPECIMEN:  L&D  COUNTS:  YES  TOURNIQUET:  * No tourniquets in log *  DICTATION: .Dragon Dictation  PLAN OF CARE: Admit to inpatient   PATIENT DISPOSITION:  PACU - hemodynamically stable.

## 2013-08-04 NOTE — Anesthesia Procedure Notes (Signed)
Spinal  Patient location during procedure: OR Start time: 08/04/2013 7:40 AM Staffing Performed by: anesthesiologist  Preanesthetic Checklist Completed: patient identified, site marked, surgical consent, pre-op evaluation, timeout performed, IV checked, risks and benefits discussed and monitors and equipment checked Spinal Block Patient position: sitting Prep: site prepped and draped and DuraPrep Patient monitoring: heart rate, cardiac monitor, continuous pulse ox and blood pressure Approach: midline Location: L3-4 Injection technique: single-shot Needle Needle type: Pencan  Needle gauge: 24 G Needle length: 9 cm Assessment Sensory level: T4 Additional Notes Clear free flow CSF on first attempt.  No paresthesia.  Patient tolerated procedure well with no apparent complications.  Jasmine December, MD

## 2013-08-05 ENCOUNTER — Encounter (HOSPITAL_COMMUNITY): Payer: Self-pay | Admitting: Obstetrics and Gynecology

## 2013-08-05 LAB — GLUCOSE, CAPILLARY
Glucose-Capillary: 105 mg/dL — ABNORMAL HIGH (ref 70–99)
Glucose-Capillary: 143 mg/dL — ABNORMAL HIGH (ref 70–99)

## 2013-08-05 LAB — CBC
HCT: 32.2 % — ABNORMAL LOW (ref 36.0–46.0)
MCH: 27.7 pg (ref 26.0–34.0)
MCHC: 32.9 g/dL (ref 30.0–36.0)
MCV: 84.1 fL (ref 78.0–100.0)
Platelets: 207 10*3/uL (ref 150–400)
RBC: 3.83 MIL/uL — ABNORMAL LOW (ref 3.87–5.11)
RDW: 14.5 % (ref 11.5–15.5)

## 2013-08-05 NOTE — Progress Notes (Signed)
Inpatient Diabetes Program Recommendations  AACE/ADA: New Consensus Statement on Inpatient Glycemic Control (2013)  Target Ranges:  Prepandial:   less than 140 mg/dL      Peak postprandial:   less than 180 mg/dL (1-2 hours)      Critically ill patients:  140 - 180 mg/dL     **Called by RN Elease Hashimoto from Mother baby unit at [facility] in regards to this patient.  Questions about Novolog SSI coverage.  CBGs appear stable and are as follows:  Results for Evelyn [name], Evelyn [name] (MRN [id number]) as of 08/05/2013 21:41  Ref. Range 08/05/2013 00:12 08/05/2013 06:08 08/05/2013 09:30 08/05/2013 16:04 08/05/2013 21:19  Glucose-Capillary Latest Range: 70-99 mg/dL 93 83 132 (H) 440 (H) 102 (H)     **Per records, patient was taking large doses of NPH and Humalog during pregnancy.  **RN had questions about the timing of patient's current Novolog SSI regimen.  Currently, Novolog SSI is ordered to be given Q4 hours and CBG checks are scheduled for QID (fasting and 1 hour postprandial).  Patient is currently on Carb Mod diet.  **Since patient has history of pre-existing Type 2 DM before this pregnancy, we should continue to monitor CBGs and cover with SSI if elevated.  Recommended to RN to please call on-call physician and ask for orders to change SSI to Novolog Sensitive tid ac + HS per Glycemic Control order set.  Since patient is postpartum, her CBGs can now be checked before meals and at bedtime per Adult DM guidelines.  RN to call physician on call for new orders.  **Also recommended to RN to please encourage patient to follow up with her PCP after d/c for further DM monitoring and management as patient may require adjustment/addition of DM medications to control her CBGs at home.  In the immediate postpartum period, patient will likely have decreased need for insulin/oral DM medications, however, as time passes, patient may require resumption of oral DM medications at some point (to be addressed by her PCP).  Will  follow. Ambrose Finland RN, MSN, CDE Diabetes Coordinator Inpatient Diabetes Program Team Pager: [phone] (8a-10p)

## 2013-08-05 NOTE — Progress Notes (Signed)
Subjective: Postpartum Day 1 Cesarean Delivery Patient reports tolerating PO and no problems voiding.    Objective: Vital signs in last 24 hours: Temp:  [97.8 F (36.6 C)-99 F (37.2 C)] 98.6 F (37 C) (10/15 0915) Pulse Rate:  [70-94] 83 (10/15 0915) Resp:  [13-18] 18 (10/15 0915) BP: (112-136)/(57-80) 121/76 mmHg (10/15 0915) SpO2:  [96 %-100 %] 97 % (10/15 0915) All BS readings WNL on diet!!  Physical Exam:  General: alert and cooperative Lochia: appropriate Uterine Fundus: firm Incision C/D/I    Recent Labs  08/03/13 1025 08/05/13 0545  HGB 11.2* 10.6*  HCT 34.2* 32.2*    Assessment/Plan: Status post Cesarean section. Doing well postoperatively.  Continue current care. Baby doing well with no hypoglycemia Astrid Vides W 08/05/2013, 9:41 AM

## 2013-08-06 DIAGNOSIS — Z98891 History of uterine scar from previous surgery: Secondary | ICD-10-CM

## 2013-08-06 DIAGNOSIS — E119 Type 2 diabetes mellitus without complications: Secondary | ICD-10-CM | POA: Diagnosis present

## 2013-08-06 LAB — GLUCOSE, CAPILLARY
Glucose-Capillary: 68 mg/dL — ABNORMAL LOW (ref 70–99)
Glucose-Capillary: 76 mg/dL (ref 70–99)
Glucose-Capillary: 85 mg/dL (ref 70–99)

## 2013-08-06 MED ORDER — IBUPROFEN 600 MG PO TABS: 600.0000 mg | ORAL_TABLET | Freq: Four times a day (QID) | ORAL | Status: DC

## 2013-08-06 MED ORDER — OXYCODONE-ACETAMINOPHEN 5-325 MG PO TABS: 1.0000 | ORAL_TABLET | ORAL | Status: DC | PRN

## 2013-08-06 MED ORDER — MEDROXYPROGESTERONE ACETATE 150 MG/ML IM SUSP
150.0000 mg | Freq: Once | INTRAMUSCULAR | Status: AC
  Administered 2013-08-06: 150 mg via INTRAMUSCULAR
  Filled 2013-08-06: qty 1

## 2013-08-06 NOTE — Progress Notes (Signed)
Subjective: Postpartum Day 2 Cesarean Delivery Patient reports tolerating PO and no problems voiding.  Desires d/c home  Objective: Vital signs in last 24 hours: Temp:  [98.4 F (36.9 C)-98.6 F (37 C)] 98.4 F (36.9 C) (10/16 0540) Pulse Rate:  [76-83] 76 (10/16 0540) Resp:  [18] 18 (10/16 0540) BP: (110-126)/(68-78) 110/68 mmHg (10/16 0540) SpO2:  [97 %] 97 % (10/15 0915)  Physical Exam:  General: alert and cooperative Lochia: appropriate Uterine Fundus: firm Incision:C/D/I  All BS WNL  Recent Labs  08/03/13 1025 08/05/13 0545  HGB 11.2* 10.6*  HCT 34.2* 32.2*    Assessment/Plan: Status post Cesarean section. Doing well postoperatively.  Discharge home with standard precautions and return to office in 2 weeks. BS hve been WNL on diet postpartum.  Advised pt no meds, but needs to be compliant with diet at home and continue intermittent finger sticks Pt desires depo provera  Evelyn [name] W 08/06/2013, 8:27 AM

## 2013-08-06 NOTE — Progress Notes (Signed)
Spoke to [employer] RN, diabetes coordinator regarding patient's insulin orders. Patient currently ordered novolog SSI q4hrs. She has not required insulin coverage this hospital stay. Current 1 hour post-prandial CBG 143. Jeannine's recommendation after reviewing patient's chart is to suggest to MD to change insulin regimen to sensitive scale and TID AC&HS.

## 2013-08-06 NOTE — Discharge Summary (Signed)
Obstetric Discharge Summary Reason for Admission: cesarean section Prenatal Procedures: NST Intrapartum Procedures: cesarean: low cervical, transverse Postpartum Procedures: blood glucose monitoring Complications-Operative and Postpartum: none Hemoglobin  Date Value Range Status  08/05/2013 10.6* 12.0 - 15.0 g/dL Final     HCT  Date Value Range Status  08/05/2013 32.2* 36.0 - 46.0 % Final    Physical Exam:  General: alert and cooperative Lochia: appropriate Uterine Fundus: firm Incision: C/D/I   Discharge Diagnoses: Term Pregnancy-delivered  Discharge Information: Date: 08/06/2013 Activity: pelvic rest Diet: routine Medications: Ibuprofen and Percocet Condition: improved Instructions: refer to practice specific booklet  Pt to continue intermittent glucose checks but has done well off meds and on diet Discharge to: home Follow-up Information   Follow up with Oliver Pila, MD. Schedule an appointment as soon as possible for a visit in 2 weeks. (incision check)    Specialty:  Obstetrics and Gynecology   Contact information:   [address], [address] [place] [address] [phone]       Newborn Data: Live born female  Birth Weight: 8 lb 13.3 oz (4006 g) APGAR: 9, 9  Home with mother.  Oliver Pila 08/06/2013, 5:54 AM

## 2013-08-06 NOTE — Progress Notes (Signed)
Patient's fasting CBG 68. Only complaint is of slight headache. Patient has not had much sleep and has been medicated. 4 oz juice given. Will recheck CBG in .

## 2013-08-06 NOTE — Progress Notes (Signed)
Follow-up CBG 76. Snack given, encouraged patient to order breakfast. Will continue to monitor patient.

## 2013-08-06 NOTE — Progress Notes (Signed)
Called Dr. Senaida Ores regarding conversation with diabetes coordinator and patient's insulin orders. Orders received. To continue current blood sugar orders for fasting and 1 hour post-prandial. Patient informed. Will continue to monitor patient.

## 2014-04-29 ENCOUNTER — Ambulatory Visit: Payer: Medicaid Other | Admitting: Endocrinology

## 2014-05-17 ENCOUNTER — Other Ambulatory Visit: Payer: Self-pay | Admitting: Family Medicine

## 2014-05-17 ENCOUNTER — Emergency Department (HOSPITAL_COMMUNITY): Payer: Medicaid Other

## 2014-05-17 ENCOUNTER — Emergency Department (HOSPITAL_COMMUNITY)
Admission: EM | Admit: 2014-05-17 | Discharge: 2014-05-18 | Disposition: A | Discharge status: Home / Self Care | Payer: Medicaid Other | Attending: Emergency Medicine | Admitting: Emergency Medicine

## 2014-05-17 ENCOUNTER — Encounter (HOSPITAL_COMMUNITY): Payer: Self-pay | Admitting: Emergency Medicine

## 2014-05-17 DIAGNOSIS — Z3202 Encounter for pregnancy test, result negative: Secondary | ICD-10-CM | POA: Insufficient documentation

## 2014-05-17 DIAGNOSIS — Z8619 Personal history of other infectious and parasitic diseases: Secondary | ICD-10-CM | POA: Diagnosis not present

## 2014-05-17 DIAGNOSIS — R197 Diarrhea, unspecified: Secondary | ICD-10-CM | POA: Insufficient documentation

## 2014-05-17 DIAGNOSIS — R109 Unspecified abdominal pain: Secondary | ICD-10-CM

## 2014-05-17 DIAGNOSIS — Z794 Long term (current) use of insulin: Secondary | ICD-10-CM | POA: Diagnosis not present

## 2014-05-17 DIAGNOSIS — Z8679 Personal history of other diseases of the circulatory system: Secondary | ICD-10-CM | POA: Insufficient documentation

## 2014-05-17 DIAGNOSIS — Z87891 Personal history of nicotine dependence: Secondary | ICD-10-CM | POA: Diagnosis not present

## 2014-05-17 DIAGNOSIS — E119 Type 2 diabetes mellitus without complications: Secondary | ICD-10-CM | POA: Insufficient documentation

## 2014-05-17 DIAGNOSIS — R112 Nausea with vomiting, unspecified: Secondary | ICD-10-CM | POA: Diagnosis not present

## 2014-05-17 DIAGNOSIS — R1011 Right upper quadrant pain: Secondary | ICD-10-CM | POA: Diagnosis present

## 2014-05-17 LAB — URINALYSIS, ROUTINE W REFLEX MICROSCOPIC
BILIRUBIN URINE: NEGATIVE
Hgb urine dipstick: NEGATIVE
KETONES UR: NEGATIVE mg/dL
Leukocytes, UA: NEGATIVE
Nitrite: NEGATIVE
PH: 5.5 (ref 5.0–8.0)
Protein, ur: 100 mg/dL — AB
Specific Gravity, Urine: 1.036 — ABNORMAL HIGH (ref 1.005–1.030)
Urobilinogen, UA: 1 mg/dL (ref 0.0–1.0)

## 2014-05-17 LAB — COMPREHENSIVE METABOLIC PANEL
ALK PHOS: 60 U/L (ref 39–117)
ALT: 21 U/L (ref 0–35)
ANION GAP: 18 — AB (ref 5–15)
AST: 20 U/L (ref 0–37)
Albumin: 4.2 g/dL (ref 3.5–5.2)
BUN: 14 mg/dL (ref 6–23)
CALCIUM: 10 mg/dL (ref 8.4–10.5)
CO2: 21 meq/L (ref 19–32)
Chloride: 96 mEq/L (ref 96–112)
Creatinine, Ser: 0.4 mg/dL — ABNORMAL LOW (ref 0.50–1.10)
GFR calc non Af Amer: >90 mL/min (ref >90)
GLUCOSE: 308 mg/dL — AB (ref 70–99)
Potassium: 4.3 mEq/L (ref 3.7–5.3)
Sodium: 135 mEq/L — ABNORMAL LOW (ref 137–147)
Total Bilirubin: 0.4 mg/dL (ref 0.3–1.2)
Total Protein: 7.8 g/dL (ref 6.0–8.3)

## 2014-05-17 LAB — CBC WITH DIFFERENTIAL/PLATELET
Basophils Absolute: 0 10*3/uL (ref 0.0–0.1)
Basophils Relative: 0 % (ref 0–1)
EOS PCT: 1 % (ref 0–5)
Eosinophils Absolute: 0.1 10*3/uL (ref 0.0–0.7)
HEMATOCRIT: 44.3 % (ref 36.0–46.0)
Hemoglobin: 15.3 g/dL — ABNORMAL HIGH (ref 12.0–15.0)
LYMPHS ABS: 3.7 10*3/uL (ref 0.7–4.0)
LYMPHS PCT: 54 % — AB (ref 12–46)
MCH: 30 pg (ref 26.0–34.0)
MCHC: 34.5 g/dL (ref 30.0–36.0)
MCV: 86.9 fL (ref 78.0–100.0)
Monocytes Absolute: 0.5 10*3/uL (ref 0.1–1.0)
Monocytes Relative: 8 % (ref 3–12)
Neutro Abs: 2.5 10*3/uL (ref 1.7–7.7)
Neutrophils Relative %: 37 % — ABNORMAL LOW (ref 43–77)
Platelets: 330 10*3/uL (ref 150–400)
RBC: 5.1 MIL/uL (ref 3.87–5.11)
RDW: 12.5 % (ref 11.5–15.5)
WBC: 6.8 10*3/uL (ref 4.0–10.5)

## 2014-05-17 LAB — URINE MICROSCOPIC-ADD ON

## 2014-05-17 LAB — POC URINE PREG, ED: Preg Test, Ur: NEGATIVE

## 2014-05-17 LAB — LIPASE, BLOOD: Lipase: 26 U/L (ref 11–59)

## 2014-05-17 MED ORDER — OXYCODONE-ACETAMINOPHEN 5-325 MG PO TABS: 1.0000 | ORAL_TABLET | ORAL | Status: DC | PRN

## 2014-05-17 MED ORDER — OXYCODONE-ACETAMINOPHEN 5-325 MG PO TABS
1.0000 | ORAL_TABLET | Freq: Once | ORAL | Status: AC
  Administered 2014-05-17: 1 via ORAL
  Filled 2014-05-17: qty 1

## 2014-05-17 MED ORDER — ONDANSETRON HCL 4 MG PO TABS: 4.0000 mg | ORAL_TABLET | Freq: Four times a day (QID) | ORAL | Status: DC

## 2014-05-17 NOTE — ED Notes (Signed)
Patient returned from US. Patient placed back on the monitor. Patient waiting for results.

## 2014-05-17 NOTE — ED Provider Notes (Addendum)
CSN: [id number]     Arrival date & time 05/17/14  1948 History   First MD Initiated Contact with Patient 05/17/14 2024     Chief Complaint  Patient presents with  . Abdominal Pain    The patient said she has been having abdominal pain for a week.  She went to her PCP and he said he wanted a "scan" done and had scheduled for Friday.  She says she is hurting alot so they told her to come to the ED.     (Consider location/radiation/quality/duration/timing/severity/associated sxs/prior Treatment) Patient is a 28 y.o. female presenting with abdominal pain. The history is provided by the patient and medical records.  Abdominal Pain Associated symptoms: diarrhea, nausea and vomiting    This is a 28 year old female with past medical history significant for diabetes and migraine headaches from her presenting to the ED for right upper quadrant abdominal pain over the past week. Patient states she has also had nausea, vomiting, and diarrhea. Notably symptoms occur after eating fatty or greasy foods such as fatty food and fried chicken.  Patient denies any fever, sweats, or chills. No known sick contacts. Nursing changes in diet or medications.  No prior abdominal surgeries. denies urinary sx or vaginal complaints.  Patient will schedule for outpatient ultrasound on Friday by her primary care physician, however states she is in too much pain and could not wait that long. No chest pain, SOB, palpitations, dizziness.  VS stable on arrival.  Past Medical History  Diagnosis Date  . Diabetes mellitus without complication     Diagnosed in 2009; no meds   . History of trichomoniasis   . Migraine     otc med prn  . Heartburn in pregnancy    Past Surgical History  Procedure Laterality Date  . Cesarean section  2009  . Dental surgery      wisdom teeth ext  . Cesarean section N/A 08/04/2013    Procedure: REPEAT CESAREAN SECTION;  Surgeon: Oliver Pila, MD;  Location: [facility];  Service: Obstetrics;   Laterality: N/A;   Family History  Problem Relation Age of Onset  . Diabetes Mother   . Hypertension Mother   . Diabetes Father   . Birth defects Sister     heart was on wrong side  . Cancer Maternal Grandmother    History  Substance Use Topics  . Smoking status: Former Smoker -- 0.50 packs/day for 6 years    Types: Cigarettes    Quit date: 10/22/2012  . Smokeless tobacco: Never Used  . Alcohol Use: No     Comment: Occasional   OB History   Grav Para Term Preterm Abortions TAB SAB Ect Mult Living   2 2 2       2      Review of Systems  Gastrointestinal: Positive for nausea, vomiting, abdominal pain and diarrhea.  All other systems reviewed and are negative.     Allergies  Review of patient's allergies indicates no known allergies.  Home Medications   Prior to Admission medications   Medication Sig Start Date End Date Taking? Authorizing Provider  ibuprofen (ADVIL,MOTRIN) 800 MG tablet Take 800 mg by mouth every 8 (eight) hours as needed for headache.   Yes Historical Provider, MD  insulin glargine (LANTUS) 100 UNIT/ML injection Inject 50 Units into the skin daily.   Yes Historical Provider, MD   BP 132/86  Pulse 101  Temp(Src) 97.9 F (36.6 C) (Oral)  Resp 15  SpO2 97%  Physical Exam  Nursing note and vitals reviewed. Constitutional: She is oriented to person, place, and time. She appears well-developed and well-nourished. No distress.  HENT:  Head: Normocephalic and atraumatic.  Mouth/Throat: Oropharynx is clear and moist.  Eyes: Conjunctivae and EOM are normal. Pupils are equal, round, and reactive to light.  Neck: Normal range of motion.  Cardiovascular: Normal rate, regular rhythm and normal heart sounds.   Pulmonary/Chest: Effort normal and breath sounds normal. No respiratory distress. She has no wheezes.  Abdominal: Soft. Bowel sounds are normal. There is tenderness in the right upper quadrant. There is negative Murphy's sign.  Abdomen soft,  nondistended, minimal tenderness in right upper quadrant without Murphy sign  Musculoskeletal: Normal range of motion.  Neurological: She is alert and oriented to person, place, and time.  Skin: Skin is warm and dry. She is not diaphoretic.  Psychiatric: She has a normal mood and affect.    ED Course  Procedures (including critical care time) Labs Review Labs Reviewed  CBC WITH DIFFERENTIAL - Abnormal; Notable for the following:    Hemoglobin 15.3 (*)    Neutrophils Relative % 37 (*)    Lymphocytes Relative 54 (*)    All other components within normal limits  COMPREHENSIVE METABOLIC PANEL - Abnormal; Notable for the following:    Sodium 135 (*)    Glucose, Bld 308 (*)    Creatinine, Ser 0.40 (*)    Anion gap 18 (*)    All other components within normal limits  URINALYSIS, ROUTINE W REFLEX MICROSCOPIC - Abnormal; Notable for the following:    Specific Gravity, Urine 1.036 (*)    Glucose, UA >1000 (*)    Protein, ur 100 (*)    All other components within normal limits  URINE MICROSCOPIC-ADD ON - Abnormal; Notable for the following:    Squamous Epithelial / LPF MANY (*)    All other components within normal limits  LIPASE, BLOOD  POC URINE PREG, ED    Imaging Review [place] Abdomen Complete  05/17/2014   CLINICAL DATA:  Abdominal pain.  EXAM: ULTRASOUND ABDOMEN COMPLETE  COMPARISON:  CT abdomen and pelvis 05/02/2012.  FINDINGS: Gallbladder:  No gallstones or wall thickening visualized. No sonographic Murphy sign noted. The gallbladder is contracted.  Common bile duct:  Diameter: 0.4 cm  Liver:  No focal lesion identified. Within normal limits in parenchymal echogenicity.  IVC:  No abnormality visualized.  Pancreas:  Visualized portion unremarkable.  Spleen:  Size and appearance within normal limits.  Right Kidney:  Length: 12.5 cm. Echogenicity within normal limits. No mass or hydronephrosis visualized.  Left Kidney:  Length: 12.8 cm. Echogenicity within normal limits. No mass or  hydronephrosis visualized.  Abdominal aorta:  No aneurysm visualized.  Other findings:  None.  IMPRESSION: Negative for gallstones.  Negative exam.   Electronically Signed   By: Drusilla Kannerhomas  Dalessio M.D.   On: 05/17/2014 21:50     EKG Interpretation None      MDM   Final diagnoses:  Abdominal pain, unspecified abdominal location  Nausea and vomiting, vomiting of unspecified type   28 year old female with one week of right upper quadrant pain, nausea, and vomiting. There is association with fatty and greasy foods. No history of gallstones. Labs were obtained in triage which are reassuring--  elevation of glucose at 318, anion gap 18, bicarb WNL. UA is noninfectious, no ketones.  Clinically do not feel this represents DKA. On exam, patient is afebrile and overall nontoxic appearing. She has minimal tenderness  in her right upper quadrant without Murphy sign. Low suspicion for cholecystitis, possible symptomatic gallstones. Will obtain ultrasound of abdomen. Percocet given for pain as patient declined IV placement.  Pt drinking fluids at this time.  Ultrasound negative for acute findings.  Patients pain has improved with percocet.  Pt has remained afebrile and non-toxic appearing, tolerating PO without difficulty.  Abdominal exam now benign.  Low suspicion for other acute/surgical pathology at this time.  Patient will be discharged home with symptomatic care.  Will FU with PCP this week for re-check.  Discussed plan with patient, he/she acknowledged understanding and agreed with plan of care.  Return precautions given for new or worsening symptoms.  Garlon Hatchet, PA-C 05/18/14 0024  Garlon Hatchet, PA-C 05/18/14 [phone]

## 2014-05-17 NOTE — Discharge Instructions (Signed)
Take the prescribed medication as directed. °Follow-up with your primary care physician this week for re-check. °Return to the ED for new or worsening symptoms. ° °

## 2014-05-17 NOTE — ED Notes (Signed)
PA Lisa at the bedside.  

## 2014-05-17 NOTE — ED Notes (Signed)
The patient said she has been having abdominal pain for a week.  She went to her PCP and he said he wanted a "scan" done and had scheduled for Friday.  She says she is hurting alot so they told her to come to the ED.  She also says she has been having diarrhea, nausea and vomiting.  She denies blood in her stool and her urine.  She came because her pain is an 8/10.

## 2014-05-18 NOTE — ED Provider Notes (Signed)
  Medical screening examination/treatment/procedure(s) were performed by non-physician practitioner and as supervising physician I was immediately available for consultation/collaboration.   EKG Interpretation None         Gerhard Munchobert Jigar Zielke, MD 05/18/14 0025

## 2014-05-18 NOTE — ED Notes (Signed)
Discharge and follow up instructions reviewed with pt. Pt verbalized understanding.  

## 2014-05-19 ENCOUNTER — Other Ambulatory Visit: Payer: Medicaid Other

## 2014-05-19 NOTE — ED Provider Notes (Signed)
  Medical screening examination/treatment/procedure(s) were performed by non-physician practitioner and as supervising physician I was immediately available for consultation/collaboration.   EKG Interpretation None         Gerhard Munchobert Elianna Windom, MD 05/19/14 0005

## 2014-08-23 ENCOUNTER — Encounter (HOSPITAL_COMMUNITY): Payer: Self-pay | Admitting: Emergency Medicine

## 2014-10-30 ENCOUNTER — Emergency Department (HOSPITAL_COMMUNITY): Payer: Medicaid Other

## 2014-10-30 ENCOUNTER — Emergency Department (HOSPITAL_COMMUNITY)
Admission: EM | Admit: 2014-10-30 | Discharge: 2014-10-30 | Disposition: A | Discharge status: Home / Self Care | Payer: Medicaid Other | Attending: Emergency Medicine | Admitting: Emergency Medicine

## 2014-10-30 ENCOUNTER — Encounter (HOSPITAL_COMMUNITY): Payer: Self-pay | Admitting: Emergency Medicine

## 2014-10-30 DIAGNOSIS — J0100 Acute maxillary sinusitis, unspecified: Secondary | ICD-10-CM

## 2014-10-30 DIAGNOSIS — E119 Type 2 diabetes mellitus without complications: Secondary | ICD-10-CM | POA: Insufficient documentation

## 2014-10-30 DIAGNOSIS — Z8619 Personal history of other infectious and parasitic diseases: Secondary | ICD-10-CM | POA: Insufficient documentation

## 2014-10-30 DIAGNOSIS — R059 Cough, unspecified: Secondary | ICD-10-CM

## 2014-10-30 DIAGNOSIS — Z8679 Personal history of other diseases of the circulatory system: Secondary | ICD-10-CM | POA: Insufficient documentation

## 2014-10-30 DIAGNOSIS — R05 Cough: Secondary | ICD-10-CM

## 2014-10-30 DIAGNOSIS — Z794 Long term (current) use of insulin: Secondary | ICD-10-CM | POA: Insufficient documentation

## 2014-10-30 DIAGNOSIS — J069 Acute upper respiratory infection, unspecified: Secondary | ICD-10-CM | POA: Diagnosis not present

## 2014-10-30 DIAGNOSIS — Z87891 Personal history of nicotine dependence: Secondary | ICD-10-CM | POA: Insufficient documentation

## 2014-10-30 MED ORDER — BENZONATATE 100 MG PO CAPS: 100.0000 mg | ORAL_CAPSULE | Freq: Three times a day (TID) | ORAL | Status: DC

## 2014-10-30 NOTE — ED Provider Notes (Signed)
CSN: [id number]     Arrival date & time 10/30/14  1213 History   First MD Initiated Contact with Patient 10/30/14 1320     Chief Complaint  Patient presents with  . Cough     (Consider location/radiation/quality/duration/timing/severity/associated sxs/prior Treatment) Patient is a 29 y.o. female presenting with cough. The history is provided by the patient.  Cough Cough characteristics:  Non-productive Onset quality:  Gradual Duration:  2 weeks Timing:  Intermittent Progression:  Worsening Chronicity:  New Smoker: no   Context: sick contacts and upper respiratory infection   Relieved by:  None tried Worsened by:  Lying down Ineffective treatments:  None tried Associated symptoms: chills, ear pain and sinus congestion   Associated symptoms: no headaches (stopped up)    Evelyn [name] is a 29 y.o. female who presents to the ED with cough and congestion that started 2 weeks ago. She was at her doctor's office yesterday for check up for her diabetes and they gave her the flu shot. Last night the cough started getting worse. It feels like congestion in her chest but can't cough it up.  Patient states her doctor called in Amoxicillin yesterday and it is ready for her to pick up but she needs something for cough.   Past Medical History  Diagnosis Date  . Diabetes mellitus without complication     Diagnosed in 2009; no meds   . History of trichomoniasis   . Migraine     otc med prn  . Heartburn in pregnancy    Past Surgical History  Procedure Laterality Date  . Cesarean section  2009  . Dental surgery      wisdom teeth ext  . Cesarean section N/A 08/04/2013    Procedure: REPEAT CESAREAN SECTION;  Surgeon: Oliver PilaKathy W Richardson, MD;  Location: [facility];  Service: Obstetrics;  Laterality: N/A;   Family History  Problem Relation Age of Onset  . Diabetes Mother   . Hypertension Mother   . Diabetes Father   . Birth defects Sister     heart was on wrong side  . Cancer Maternal  Grandmother    History  Substance Use Topics  . Smoking status: Former Smoker -- 0.50 packs/day for 6 years    Types: Cigarettes    Quit date: 10/22/2012  . Smokeless tobacco: Never Used  . Alcohol Use: No     Comment: Occasional       OB History    Gravida Para Term Preterm AB TAB SAB Ectopic Multiple Living   2 2 2       2      Review of Systems  Constitutional: Positive for chills.  HENT: Positive for ear pain.   Respiratory: Positive for cough.   Neurological: Negative for headaches (stopped up).  all other systems negative    Allergies  Review of patient's allergies indicates no known allergies.  Home Medications   Prior to Admission medications   Medication Sig Start Date End Date Taking? Authorizing Provider  benzonatate (TESSALON) 100 MG capsule Take 1 capsule (100 mg total) by mouth every 8 (eight) hours. 10/30/14   Bali Lyn Orlene OchM Can Lucci, NP  ibuprofen (ADVIL,MOTRIN) 800 MG tablet Take 800 mg by mouth every 8 (eight) hours as needed for headache.    Historical Provider, MD  insulin glargine (LANTUS) 100 UNIT/ML injection Inject 50 Units into the skin daily.    Historical Provider, MD  ondansetron (ZOFRAN) 4 MG tablet Take 1 tablet (4 mg total) by mouth every 6 (  six) hours. 05/17/14   Garlon Hatchet, PA-C  oxyCODONE-acetaminophen (PERCOCET/ROXICET) 5-325 MG per tablet Take 1 tablet by mouth every 4 (four) hours as needed. 05/17/14   Garlon Hatchet, PA-C   BP 116/74 mmHg  Pulse 95  Temp(Src) 97.9 F (36.6 C) (Core (Comment))  Resp 18  Ht  (1.626 m)  Wt 162 lb (73.483 kg)  BMI 27.79 kg/m2  SpO2 100% Physical Exam  Constitutional: She is oriented to person, place, and time. She appears well-developed and well-nourished.  HENT:  Head: Normocephalic.  Right Ear: Tympanic membrane normal.  Left Ear: Tympanic membrane normal.  Nose: Rhinorrhea present. Right sinus exhibits maxillary sinus tenderness. Left sinus exhibits maxillary sinus tenderness.  Mouth/Throat: Uvula  is midline, oropharynx is clear and moist and mucous membranes are normal.  Eyes: EOM are normal.  Neck: Neck supple.  Cardiovascular: Normal rate.   Pulmonary/Chest: Effort normal. She has no wheezes. She has no rales.  Abdominal: Soft. There is no tenderness.  Musculoskeletal: Normal range of motion.  Neurological: She is alert and oriented to person, place, and time. No cranial nerve deficit.  Skin: Skin is warm and dry.  Psychiatric: She has a normal mood and affect. Her behavior is normal.  Nursing note and vitals reviewed.   ED Course  Procedures (including critical care time) Labs Review Labs Reviewed - No data to display  Imaging Review Dg Chest 2 View  10/30/2014   CLINICAL DATA:  Chest pain, nonproductive cough  EXAM: CHEST  2 VIEW  COMPARISON:  05/02/2014  FINDINGS: Lungs are clear.  No pleural effusion or pneumothorax.  The heart is normal in size.  Visualized osseous structures are within normal limits.  IMPRESSION: Normal chest radiographs.   Electronically Signed   By: Charline Bills M.D.   On: 10/30/2014 12:56    MDM  29 y.o. female with cough, congestion and sinus tenderness. She has Rx for antibiotic waiting for her to pick up from her PCP and request cough medication. Discussed with the patient clinical and x-ray findings and plan of care and all questioned fully answered. Stable for d/c with no respiratory distress O2 SAT 100% on R/A.    Medication List    TAKE these medications        benzonatate 100 MG capsule  Commonly known as:  TESSALON  Take 1 capsule (100 mg total) by mouth every 8 (eight) hours.      ASK your doctor about these medications        ibuprofen 800 MG tablet  Commonly known as:  ADVIL,MOTRIN  Take 800 mg by mouth every 8 (eight) hours as needed for headache.     insulin glargine 100 UNIT/ML injection  Commonly known as:  LANTUS  Inject 50 Units into the skin daily.     ondansetron 4 MG tablet  Commonly known as:  ZOFRAN  Take 1  tablet (4 mg total) by mouth every 6 (six) hours.     oxyCODONE-acetaminophen 5-325 MG per tablet  Commonly known as:  PERCOCET/ROXICET  Take 1 tablet by mouth every 4 (four) hours as needed.         Final diagnoses:  URI, acute  Acute maxillary sinusitis, recurrence not specified       Janne Napoleon, NP 10/31/14 1641  Hilario Quarry, MD 11/03/14 2352

## 2014-10-30 NOTE — ED Notes (Signed)
PT stated she was at the doctors office yesterday and has had a cough. PT stated she got the flu vaccination yesterday and has had worsening in cough and chest congestion x1 day.

## 2015-12-14 ENCOUNTER — Emergency Department (HOSPITAL_COMMUNITY)
Admission: EM | Admit: 2015-12-14 | Discharge: 2015-12-14 | Disposition: A | Discharge status: Home / Self Care | Payer: Medicaid Other | Attending: Emergency Medicine | Admitting: Emergency Medicine

## 2015-12-14 ENCOUNTER — Encounter (HOSPITAL_COMMUNITY): Payer: Self-pay

## 2015-12-14 DIAGNOSIS — Z794 Long term (current) use of insulin: Secondary | ICD-10-CM | POA: Insufficient documentation

## 2015-12-14 DIAGNOSIS — M25562 Pain in left knee: Secondary | ICD-10-CM | POA: Insufficient documentation

## 2015-12-14 DIAGNOSIS — Z87891 Personal history of nicotine dependence: Secondary | ICD-10-CM | POA: Insufficient documentation

## 2015-12-14 DIAGNOSIS — Z8679 Personal history of other diseases of the circulatory system: Secondary | ICD-10-CM | POA: Insufficient documentation

## 2015-12-14 DIAGNOSIS — E119 Type 2 diabetes mellitus without complications: Secondary | ICD-10-CM | POA: Insufficient documentation

## 2015-12-14 DIAGNOSIS — Z79899 Other long term (current) drug therapy: Secondary | ICD-10-CM | POA: Insufficient documentation

## 2015-12-14 DIAGNOSIS — Z8619 Personal history of other infectious and parasitic diseases: Secondary | ICD-10-CM | POA: Insufficient documentation

## 2015-12-14 DIAGNOSIS — D849 Immunodeficiency, unspecified: Secondary | ICD-10-CM | POA: Insufficient documentation

## 2015-12-14 MED ORDER — NAPROXEN 500 MG PO TABS
500.0000 mg | ORAL_TABLET | Freq: Two times a day (BID) | ORAL | Status: DC
Start: 2015-12-14 — End: 2016-07-23

## 2015-12-14 NOTE — ED Notes (Signed)
Pt states knee pain x 2 months.  Pt went to another hospital and xray checked with no dx.  Pt states no injury. Wants MRI.  Cannot afford to pay for on outpatient

## 2015-12-14 NOTE — Discharge Instructions (Signed)
Take naproxen as directed for inflammation and pain.  Follow up with orthopedic as we discussed for further evaluation and management.  Return to ER for new or worsening symptoms, any additional concerns.   COLD THERAPY DIRECTIONS:  Ice or gel packs can be used to reduce both pain and swelling. Ice is the most helpful within the first 24 to 48 hours after an injury or flareup from overusing a muscle or joint.  Ice is effective, has very few side effects, and is safe for most people to use.   If you expose your skin to cold temperatures for too long or without the proper protection, you can damage your skin or nerves. Watch for signs of skin damage due to cold.   HOME CARE INSTRUCTIONS  Follow these tips to use ice and cold packs safely.  Place a dry or damp towel between the ice and skin. A damp towel will cool the skin more quickly, so you may need to shorten the time that the ice is used.  For a more rapid response, add gentle compression to the ice.  Ice for no more than 10 to 20 minutes at a time. The bonier the area you are icing, the less time it will take to get the benefits of ice.  Check your skin after 5 minutes to make sure there are no signs of a poor response to cold or skin damage.  Rest 20 minutes or more in between uses.  Once your skin is numb, you can end your treatment. You can test numbness by very lightly touching your skin. The touch should be so light that you do not see the skin dimple from the pressure of your fingertip. When using ice, most people will feel these normal sensations in this order: cold, burning, aching, and numbness.

## 2015-12-14 NOTE — ED Provider Notes (Signed)
CSN: [id number]     Arrival date & time 12/14/15  1056 History  By signing my name below, I, Placido Sou, attest that this documentation has been prepared under the direction and in the presence of [facility], PA-C. Electronically Signed: Placido Sou, ED Scribe. 12/14/2015. 12:59 PM.    Chief Complaint  Patient presents with  . Knee Pain   The history is provided by the patient. No language interpreter was used.    HPI Comments: Evelyn [name] is a 30 y.o. female with a PMHx of IDDM who presents to the Emergency Department complaining of worsening, moderate, left knee pain onset 2 months ago. Pt was seen 3 days ago and had an x-ray performed with negative results now requesting an MRI due to insufficient funds to have it performed in an outpatient setting. She notes the pain began intermittently and then worsened to become constant further noting worsening pain when standing for long periods of time at work or extending the affected joint. She confirms her PMHx including IDDM noting that she has not been taking her rx medications as needed. Pt denies a recent increase in activity or any known injuries. Pt denies any other associated symptoms at this time.   Past Medical History  Diagnosis Date  . Diabetes mellitus without complication (HCC)     Diagnosed in 2009; no meds   . History of trichomoniasis   . Migraine     otc med prn  . Heartburn in pregnancy    Past Surgical History  Procedure Laterality Date  . Cesarean section  2009  . Dental surgery      wisdom teeth ext  . Cesarean section N/A 08/04/2013    Procedure: REPEAT CESAREAN SECTION;  Surgeon: Oliver Pila, MD;  Location: [facility];  Service: Obstetrics;  Laterality: N/A;   Family History  Problem Relation Age of Onset  . Diabetes Mother   . Hypertension Mother   . Diabetes Father   . Birth defects Sister     heart was on wrong side  . Cancer Maternal Grandmother    Social History  Substance Use  Topics  . Smoking status: Former Smoker -- 0.50 packs/day for 6 years    Types: Cigarettes    Quit date: 10/22/2012  . Smokeless tobacco: Never Used  . Alcohol Use: No     Comment: Occasional   OB History    Gravida Para Term Preterm AB TAB SAB Ectopic Multiple Living   Review of Systems  Musculoskeletal: Positive for arthralgias. Negative for joint swelling.  Skin: Negative for color change and wound.  Allergic/Immunologic: Positive for immunocompromised state.    Allergies  Review of patient's allergies indicates no known allergies.  Home Medications   Prior to Admission medications   Medication Sig Start Date End Date Taking? Authorizing Provider  benzonatate (TESSALON) 100 MG capsule Take 1 capsule (100 mg total) by mouth every 8 (eight) hours. 10/30/14   Hope Orlene Och, NP  ibuprofen (ADVIL,MOTRIN) 800 MG tablet Take 800 mg by mouth every 8 (eight) hours as needed for headache.    Historical Provider, MD  insulin glargine (LANTUS) 100 UNIT/ML injection Inject 50 Units into the skin daily.    Historical Provider, MD  naproxen (NAPROSYN) 500 MG tablet Take 1 tablet (500 mg total) by mouth 2 (two) times daily. 12/14/15   Chase Picket Jhonatan Lomeli, PA-C  ondansetron (ZOFRAN) 4 MG  tablet Take 1 tablet (4 mg total) by mouth every 6 (six) hours. 05/17/14   Garlon Hatchet, PA-C  oxyCODONE-acetaminophen (PERCOCET/ROXICET) 5-325 MG per tablet Take 1 tablet by mouth every 4 (four) hours as needed. 05/17/14   Garlon Hatchet, PA-C   BP 136/92 mmHg  Pulse 118  Temp(Src) 98 F (36.7 C) (Oral)  Resp 20  SpO2 99%    Physical Exam  Constitutional: She is oriented to person, place, and time. She appears well-developed and well-nourished.  HENT:  Head: Normocephalic and atraumatic.  Neck: Normal range of motion.  Cardiovascular: Normal rate, regular rhythm and normal heart sounds.  Exam reveals no gallop and no friction rub.   No murmur heard. Pulmonary/Chest: Effort normal and  breath sounds normal. No respiratory distress. She has no wheezes. She has no rales. She exhibits no tenderness.  Abdominal: Soft.  Musculoskeletal: Normal range of motion.  Left knee: No gross deformity noted. Knee with full ROM. No joint line or bony TTP. Mild tenderness over patellar tendon. There is no joint effusion or swelling appreciated. No abnormal alignment or patellar mobility. No bruising, erythema, or warmth overlaying the joint. No varus/valgus laxity. Negative drawer's, negative Lachman's, negative McMurray's, no crepitus. 2+ DP pulse's bilaterally. All compartments are soft. Sensation intact distal to injury.  Neurological: She is alert and oriented to person, place, and time.  Skin: Skin is warm and dry.  Psychiatric: She has a normal mood and affect.  Nursing note and vitals reviewed.   ED Course  Procedures  DIAGNOSTIC STUDIES: Oxygen Saturation is 99% on RA, normal by my interpretation.    COORDINATION OF CARE: 12:55 PM Discussed next steps with pt. Pt verbalized understanding and is agreeable with the plan.   Labs Review Labs Reviewed - No data to display  Imaging Review No results found.   EKG Interpretation None      MDM   Final diagnoses:  Knee pain, left   [name] presents with knee pain x 2 months. X-rays done on Sunday (3 days ago) at [facility] with no acute findings, therefore will not re-image. Patient was requesting MRI, however on exam knee with no signs of ligamentous damage or instability and MRI in ED not warranted. She has an out of town orthopedic appointment in two weeks at a facility that will work with her income and provide payment plan. No ortho referral needed. Symptomatic care instructions discussed. Follow up return precautions given. All questions answered.   I personally performed the services described in this documentation, which was scribed in my presence. The recorded information has been reviewed and is accurate.  [facility] Claire Dolores, PA-C 12/14/15 1324  Nelva Nay, MD 12/15/15 1435

## 2015-12-18 DIAGNOSIS — F439 Reaction to severe stress, unspecified: Secondary | ICD-10-CM | POA: Insufficient documentation

## 2015-12-18 DIAGNOSIS — E113299 Type 2 diabetes mellitus with mild nonproliferative diabetic retinopathy without macular edema, unspecified eye: Secondary | ICD-10-CM | POA: Diagnosis present

## 2016-07-23 ENCOUNTER — Encounter (HOSPITAL_COMMUNITY): Payer: Self-pay

## 2016-07-23 ENCOUNTER — Emergency Department (HOSPITAL_COMMUNITY)
Admission: EM | Admit: 2016-07-23 | Discharge: 2016-07-23 | Disposition: A | Discharge status: Home / Self Care | Payer: Medicaid Other | Attending: Emergency Medicine | Admitting: Emergency Medicine

## 2016-07-23 DIAGNOSIS — E119 Type 2 diabetes mellitus without complications: Secondary | ICD-10-CM | POA: Insufficient documentation

## 2016-07-23 DIAGNOSIS — G43C0 Periodic headache syndromes in child or adult, not intractable: Secondary | ICD-10-CM | POA: Insufficient documentation

## 2016-07-23 DIAGNOSIS — Z87891 Personal history of nicotine dependence: Secondary | ICD-10-CM | POA: Insufficient documentation

## 2016-07-23 LAB — BASIC METABOLIC PANEL
ANION GAP: 10 (ref 5–15)
BUN: 10 mg/dL (ref 6–20)
CHLORIDE: 102 mmol/L (ref 101–111)
CO2: 23 mmol/L (ref 22–32)
CREATININE: 0.41 mg/dL — AB (ref 0.44–1.00)
Calcium: 9.6 mg/dL (ref 8.9–10.3)
GFR calc Af Amer: >60 mL/min (ref >60)
GFR calc non Af Amer: >60 mL/min (ref >60)
Glucose, Bld: 267 mg/dL — ABNORMAL HIGH (ref 65–99)
Potassium: 3.5 mmol/L (ref 3.5–5.1)
SODIUM: 135 mmol/L (ref 135–145)

## 2016-07-23 LAB — POC URINE PREG, ED: PREG TEST UR: NEGATIVE

## 2016-07-23 LAB — CBG MONITORING, ED: Glucose-Capillary: 277 mg/dL — ABNORMAL HIGH (ref 65–99)

## 2016-07-23 LAB — CBC
HCT: 44.6 % (ref 36.0–46.0)
HEMOGLOBIN: 15.7 g/dL — AB (ref 12.0–15.0)
MCH: 30.4 pg (ref 26.0–34.0)
MCHC: 35.2 g/dL (ref 30.0–36.0)
MCV: 86.4 fL (ref 78.0–100.0)
Platelets: 288 10*3/uL (ref 150–400)
RBC: 5.16 MIL/uL — AB (ref 3.87–5.11)
RDW: 12.2 % (ref 11.5–15.5)
WBC: 12.6 10*3/uL — AB (ref 4.0–10.5)

## 2016-07-23 MED ORDER — IBUPROFEN 800 MG PO TABS: 800.0000 mg | ORAL_TABLET | Freq: Three times a day (TID) | ORAL | 0 refills | Status: DC | PRN

## 2016-07-23 MED ORDER — METOCLOPRAMIDE HCL 5 MG/ML IJ SOLN
10.0000 mg | Freq: Once | INTRAMUSCULAR | Status: AC
  Administered 2016-07-23: 10 mg via INTRAVENOUS
  Filled 2016-07-23: qty 2

## 2016-07-23 MED ORDER — KETOROLAC TROMETHAMINE 30 MG/ML IJ SOLN
30.0000 mg | Freq: Once | INTRAMUSCULAR | Status: AC
  Administered 2016-07-23: 30 mg via INTRAVENOUS
  Filled 2016-07-23: qty 1

## 2016-07-23 MED ORDER — SODIUM CHLORIDE 0.9 % IV BOLUS (SEPSIS)
500.0000 mL | Freq: Once | INTRAVENOUS | Status: AC
  Administered 2016-07-23: 500 mL via INTRAVENOUS

## 2016-07-23 MED ORDER — DIPHENHYDRAMINE HCL 50 MG/ML IJ SOLN
12.5000 mg | Freq: Once | INTRAMUSCULAR | Status: AC
  Administered 2016-07-23: 12.5 mg via INTRAVENOUS
  Filled 2016-07-23: qty 1

## 2016-07-23 MED ORDER — METOCLOPRAMIDE HCL 10 MG PO TABS: 10.0000 mg | ORAL_TABLET | Freq: Four times a day (QID) | ORAL | 0 refills | Status: DC | PRN

## 2016-07-23 NOTE — ED Notes (Signed)
CBG of 277.

## 2016-07-23 NOTE — ED Notes (Signed)
vo to give another 500 bolus. Bolus rinning now

## 2016-07-23 NOTE — ED Triage Notes (Signed)
Pt reports migraine and vomiting since midnight last night.  Pt has history of migraines.  Last medication was tylenol, around 1 am.

## 2016-07-23 NOTE — ED Provider Notes (Signed)
[facility]-EMER[facility]Y DEPT Provider Note   CSN: [id number] Arrival date & time: 07/23/16  0857   History   Chief Complaint Chief Complaint  Patient presents with  . Migraine    HPI Evelyn [name] is a 30 year old woman with history of DM2, migraines.  HPI She presents with headache and emesis since midnight. It woke her from her sleep. She took Tylenol without relief. She did not try ibuprofen because she did not have any. She thinks her blood sugar may have triggered this headache. Her headache is worse with being upright or walking. Loud voices exacerbate the pain. No photophobia. She does not have a meter at home and did not check it. She has frequent headaches, about twice a week, that usually resolve with Tylenol or ibuprofen.   She is feeling chilled. She had nonbloody emesis last night. No weight loss. Denies confusion or impaired alertness. No neck pain or stiffness. No seizures. Feels similar to previous headaches she has had. No head trauma, no illicits, no toxic exposure. Denies vision changes, cough. She has chronic (loose stools) diarrhea she attributes to her diabetes. No sick contacts. No recent travel.  Family history of migraines in mother.   Past Medical History:  Diagnosis Date  . Diabetes mellitus without complication (HCC)    Diagnosed in 2009; no meds   . Heartburn in pregnancy   . History of trichomoniasis   . Migraine    otc med prn    Patient Active Problem List   Diagnosis Date Noted  . S/P repeat low transverse C-section 08/06/2013  . Type 2 diabetes mellitus (HCC) 08/06/2013  . Migraines 12/23/2012  . History of trichomoniasis 12/23/2012  . HSV (herpes simplex virus) infection 12/23/2012  . IDDM 09/07/2010    Past Surgical History:  Procedure Laterality Date  . CESAREAN SECTION  2009  . CESAREAN SECTION N/A 08/04/2013   Procedure: REPEAT CESAREAN SECTION;  Surgeon: Oliver Pila, MD;  Location: [facility];  Service: Obstetrics;  Laterality: N/A;    . DENTAL SURGERY     wisdom teeth ext    OB History    Gravida Para Term Preterm AB Living   2 2 2     2    SAB TAB Ectopic Multiple Live Births           2       Home Medications    Prior to Admission medications   Medication Sig Start Date End Date Taking? Authorizing Provider  MedroxyPROGESTERone Acetate 150 MG/ML SUSY 1 mL every 3 (three) months. 06/26/16  Yes Historical Provider, MD  ibuprofen (ADVIL,MOTRIN) 800 MG tablet Take 1 tablet (800 mg total) by mouth every 8 (eight) hours as needed for headache. 07/23/16   Lora Paula, MD  metoCLOPramide (REGLAN) 10 MG tablet Take 1 tablet (10 mg total) by mouth every 6 (six) hours as needed for nausea. 07/23/16   Lora Paula, MD    Family History Family History  Problem Relation Age of Onset  . Diabetes Mother   . Hypertension Mother   . Diabetes Father   . Birth defects Sister     heart was on wrong side  . Cancer Maternal Grandmother     Social History Social History  Substance Use Topics  . Smoking status: Former Smoker    Packs/day: 0.50    Years: 6.00    Types: Cigarettes    Quit date: 10/22/2012  . Smokeless tobacco: Never Used  . Alcohol use No  Comment: Occasional     Allergies   Review of patient's allergies indicates no known allergies.   Review of Systems Review of Systems Constitutional: no fevers/chills Eyes: no vision changes Ears, nose, mouth, throat, and face: no cough Respiratory: no shortness of breath Cardiovascular: no chest pain Gastrointestinal: no nausea/vomiting, no abdominal pain, no constipation, no diarrhea Genitourinary: no dysuria, no hematuria Integument: no rash Hematologic/lymphatic: no bleeding/bruising, no edema Musculoskeletal: no arthralgias, no myalgias Neurological: no paresthesias, no weakness   Physical Exam Updated Vital Signs BP 112/67 (BP Location: Left Arm)   Pulse 112   Temp 99.7 F (37.6 C) (Oral)   Resp 17   Ht 5\' 3"  (1.6 m)   Wt 72.6 kg    SpO2 99%   BMI 28.34 kg/m   Physical Exam General Apperance: NAD Head: Normocephalic, atraumatic Eyes: PERRL, EOMI, anicteric sclera Ears: Normal external ear canal Nose: Nares normal, septum midline, mucosa normal Throat: Lips, mucosa and tongue normal  Neck: Supple, trachea midline Back: No tenderness or bony abnormality  Lungs: Clear to auscultation bilaterally. No wheezes, rhonchi or rales. Breathing comfortably Chest Wall: Nontender, no deformity Heart: Regular rate and rhythm, no murmur/rub/gallop Abdomen: Soft, nontender, nondistended, no rebound/guarding Extremities: Normal, atraumatic, warm and well perfused, no edema Pulses: 2+ throughout Skin: No rashes or lesions Neurologic: Alert and oriented x 3. CNII-XII intact. Normal strength and sensation   ED Treatments / Results  Labs (all labs ordered are listed, but only abnormal results are displayed) Labs Reviewed  BASIC METABOLIC PANEL - Abnormal; Notable for the following:       Result Value   Glucose, Bld 267 (*)    Creatinine, Ser 0.41 (*)    All other components within normal limits  CBC - Abnormal; Notable for the following:    WBC 12.6 (*)    RBC 5.16 (*)    Hemoglobin 15.7 (*)    All other components within normal limits  CBG MONITORING, ED - Abnormal; Notable for the following:    Glucose-Capillary 277 (*)    All other components within normal limits  HIV ANTIBODY (ROUTINE TESTING)  POC URINE PREG, ED   Procedures Procedures None  Medications Ordered in ED Medications  metoCLOPramide (REGLAN) injection 10 mg (10 mg Intravenous Given 07/23/16 1008)  diphenhydrAMINE (BENADRYL) injection 12.5 mg (12.5 mg Intravenous Given 07/23/16 1010)  ketorolac (TORADOL) 30 MG/ML injection 30 mg (30 mg Intravenous Given 07/23/16 1008)  sodium chloride 0.9 % bolus 500 mL (0 mLs Intravenous Stopped 07/23/16 1123)     Initial Impression / Assessment and Plan / ED Course  I have reviewed the triage vital signs and the  nursing notes.  Pertinent labs & imaging results that were available during my care of the patient were reviewed by me and considered in my medical decision making (see chart for details).  Clinical Course  10:00am - given Reglan, Benadryl and Toradol 11:00am - 1L NS bolus given, headache improved  Final Clinical Impressions(s) / ED Diagnoses   Final diagnoses:  Periodic headache syndrome, not intractable  History consistent with migraine. Blood glucose 267 but her bicarb and anion gap are within normal limits making DKA unlikely. She has a leukocytosis of 12.6 and Hgb 15.7. Possible hemoconcentration. Denies neck stiffness or pain making meningitis unlikely. No other signs of infection. UPT negative. HIV screen pending.  Discussed return precautions with patient including but not limited to worsening of her symptoms, fever, neck pain/stiffness, diarrhea, dysuria.  Ibuprofen 800mg  TID prn headache Reglan  10mg  q6hr prn nausea. Take with OTC Benadryl.   New Prescriptions New Prescriptions   IBUPROFEN (ADVIL,MOTRIN) 800 MG TABLET    Take 1 tablet (800 mg total) by mouth every 8 (eight) hours as needed for headache.   METOCLOPRAMIDE (REGLAN) 10 MG TABLET    Take 1 tablet (10 mg total) by mouth every 6 (six) hours as needed for nausea.     Lora PaulaJennifer T Krall, MD 07/23/16 1240    Blane OharaJoshua Zavitz, MD 07/24/16 [id number]

## 2016-07-24 LAB — HIV ANTIBODY (ROUTINE TESTING W REFLEX): HIV Screen 4th Generation wRfx: NONREACTIVE

## 2017-07-22 ENCOUNTER — Encounter (HOSPITAL_COMMUNITY): Payer: Self-pay | Admitting: *Deleted

## 2017-07-22 ENCOUNTER — Inpatient Hospital Stay (HOSPITAL_COMMUNITY)
Admission: AD | Admit: 2017-07-22 | Discharge: 2017-07-22 | Disposition: A | Discharge status: Home / Self Care | Payer: Self-pay | Source: Ambulatory Visit | Attending: Obstetrics and Gynecology | Admitting: Obstetrics and Gynecology

## 2017-07-22 DIAGNOSIS — Z87440 Personal history of urinary (tract) infections: Secondary | ICD-10-CM | POA: Insufficient documentation

## 2017-07-22 DIAGNOSIS — Z9889 Other specified postprocedural states: Secondary | ICD-10-CM | POA: Insufficient documentation

## 2017-07-22 DIAGNOSIS — Z820 Family history of epilepsy and other diseases of the nervous system: Secondary | ICD-10-CM | POA: Insufficient documentation

## 2017-07-22 DIAGNOSIS — Z794 Long term (current) use of insulin: Secondary | ICD-10-CM | POA: Insufficient documentation

## 2017-07-22 DIAGNOSIS — Z8619 Personal history of other infectious and parasitic diseases: Secondary | ICD-10-CM | POA: Insufficient documentation

## 2017-07-22 DIAGNOSIS — Z809 Family history of malignant neoplasm, unspecified: Secondary | ICD-10-CM | POA: Insufficient documentation

## 2017-07-22 DIAGNOSIS — G43909 Migraine, unspecified, not intractable, without status migrainosus: Secondary | ICD-10-CM | POA: Insufficient documentation

## 2017-07-22 DIAGNOSIS — N938 Other specified abnormal uterine and vaginal bleeding: Secondary | ICD-10-CM | POA: Insufficient documentation

## 2017-07-22 DIAGNOSIS — Z8279 Family history of other congenital malformations, deformations and chromosomal abnormalities: Secondary | ICD-10-CM | POA: Insufficient documentation

## 2017-07-22 DIAGNOSIS — Z87891 Personal history of nicotine dependence: Secondary | ICD-10-CM | POA: Insufficient documentation

## 2017-07-22 DIAGNOSIS — Z833 Family history of diabetes mellitus: Secondary | ICD-10-CM | POA: Insufficient documentation

## 2017-07-22 DIAGNOSIS — Z83438 Family history of other disorder of lipoprotein metabolism and other lipidemia: Secondary | ICD-10-CM | POA: Insufficient documentation

## 2017-07-22 DIAGNOSIS — E119 Type 2 diabetes mellitus without complications: Secondary | ICD-10-CM | POA: Insufficient documentation

## 2017-07-22 DIAGNOSIS — N921 Excessive and frequent menstruation with irregular cycle: Secondary | ICD-10-CM

## 2017-07-22 HISTORY — DX: Unspecified infectious disease: B99.9

## 2017-07-22 LAB — URINALYSIS, ROUTINE W REFLEX MICROSCOPIC
Bilirubin Urine: NEGATIVE
Glucose, UA: >=500 mg/dL — AB
KETONES UR: 5 mg/dL — AB
Leukocytes, UA: NEGATIVE
Nitrite: NEGATIVE
Protein, ur: NEGATIVE mg/dL
Specific Gravity, Urine: 1.037 — ABNORMAL HIGH (ref 1.005–1.030)
WBC, UA: NONE SEEN WBC/hpf (ref 0–5)
pH: 7 (ref 5.0–8.0)

## 2017-07-22 LAB — CBC
HCT: 39 % (ref 36.0–46.0)
Hemoglobin: 14 g/dL (ref 12.0–15.0)
MCH: 30.5 pg (ref 26.0–34.0)
MCHC: 35.9 g/dL (ref 30.0–36.0)
MCV: 85 fL (ref 78.0–100.0)
Platelets: 317 10*3/uL (ref 150–400)
RBC: 4.59 MIL/uL (ref 3.87–5.11)
RDW: 12.6 % (ref 11.5–15.5)
WBC: 8.7 10*3/uL (ref 4.0–10.5)

## 2017-07-22 LAB — POCT PREGNANCY, URINE: Preg Test, Ur: NEGATIVE

## 2017-07-22 MED ORDER — MEDROXYPROGESTERONE ACETATE 150 MG/ML IM SUSP
150.0000 mg | Freq: Once | INTRAMUSCULAR | Status: AC
  Administered 2017-07-22: 150 mg via INTRAMUSCULAR
  Filled 2017-07-22: qty 1

## 2017-07-22 NOTE — MAU Note (Signed)
Urine sent to lab 

## 2017-07-22 NOTE — MAU Note (Signed)
Been on depo almost 4 yrs.  Usually will spot just before the depo, but started bleeding heavy last Thu, has been off and on. Depo is due later this week

## 2017-07-22 NOTE — Discharge Instructions (Signed)

## 2017-07-22 NOTE — MAU Provider Note (Signed)
History     CSN: [id number]  Arrival date and time: 07/22/17 1725   First Provider Initiated Contact with Patient 07/22/17 1840      Chief Complaint  Patient presents with  . Vaginal Bleeding   HPI  [name] is a 31 y.o. [id number] female who presents to MAU today with complaint of vaginal bleeding. The patient has been on Depo Provera x 4 years. She usually doesn't have much bleeding aside from spotting when it is almost time for her next injection. She started bleeding earlier this week and it has been heavy today with clots. She states moderate lower abdominal cramping associated with the bleeding. She is due for her next Depo provera injection this week.   OB History    Gravida Para Term Preterm AB Living   SAB TAB Ectopic Multiple Live Births           2      Past Medical History:  Diagnosis Date  . Diabetes mellitus without complication (HCC)    Diagnosed in 2009; on insulin  . Heartburn in pregnancy   . History of trichomoniasis   . Infection    UTI  . Migraine    otc med prn    Past Surgical History:  Procedure Laterality Date  . CESAREAN SECTION  2009  . CESAREAN SECTION N/A 08/04/2013   Procedure: REPEAT CESAREAN SECTION;  Surgeon: Oliver Pila, MD;  Location: [facility];  Service: Obstetrics;  Laterality: N/A;  . DENTAL SURGERY     wisdom teeth ext    Family History  Problem Relation Age of Onset  . Diabetes Mother   . Hypertension Mother   . Diabetes Father   . Birth defects Sister        heart was on wrong side  . Cancer Maternal Grandmother   . Alzheimer's disease Maternal Grandfather   . Cancer Paternal Grandmother     Social History  Substance Use Topics  . Smoking status: Former Smoker    Packs/day: 0.50    Years: 6.00    Types: Cigarettes    Quit date: 10/22/2012  . Smokeless tobacco: Never Used  . Alcohol use No     Comment: Occasional    Allergies: No Known Allergies  Prescriptions Prior to Admission   Medication Sig Dispense Refill Last Dose  . ibuprofen (ADVIL,MOTRIN) 800 MG tablet Take 1 tablet (800 mg total) by mouth every 8 (eight) hours as needed for headache. 15 tablet 0   . MedroxyPROGESTERone Acetate 150 MG/ML SUSY 1 mL every 3 (three) months.  5 Past Month at Unknown time  . metoCLOPramide (REGLAN) 10 MG tablet Take 1 tablet (10 mg total) by mouth every 6 (six) hours as needed for nausea. 6 tablet 0     Review of Systems  Constitutional: Negative for fever.  Gastrointestinal: Positive for abdominal pain. Negative for constipation, diarrhea, nausea and vomiting.  Genitourinary: Positive for vaginal bleeding. Negative for dysuria, frequency, urgency and vaginal discharge.   Physical Exam   Blood pressure 127/82, pulse (!) 112, temperature 98.3 F (36.8 C), temperature source Oral, resp. rate 18, weight 154 lb 4 oz (70 kg), last menstrual period 07/18/2017, SpO2 98 %, unknown if currently breastfeeding.  Physical Exam  Nursing note and vitals reviewed. Constitutional: She is oriented to person, place, and time. She appears well-developed and well-nourished. No distress.  HENT:  Head: Normocephalic and atraumatic.  Cardiovascular: Normal  rate.   Respiratory: Effort normal.  GI: Soft. She exhibits no distension and no mass. There is no tenderness. There is no rebound and no guarding.  Genitourinary: Uterus is not enlarged and not tender. Cervix exhibits no motion tenderness, no discharge and no friability. Right adnexum displays no mass and no tenderness. Left adnexum displays no mass and no tenderness. There is bleeding (small) in the vagina. No vaginal discharge found.  Neurological: She is alert and oriented to person, place, and time.  Skin: Skin is warm and dry. No erythema.  Psychiatric: She has a normal mood and affect.    Results for orders placed or performed during the hospital encounter of 07/22/17 (from the past 24 hour(s))  Urinalysis, Routine w reflex microscopic      Status: Abnormal   Collection Time: 07/22/17  6:26 PM  Result Value Ref Range   Color, Urine YELLOW YELLOW   APPearance HAZY (A) CLEAR   Specific Gravity, Urine 1.037 (H) 1.005 - 1.030   pH 7.0 5.0 - 8.0   Glucose, UA >=500 (A) NEGATIVE mg/dL   Hgb urine dipstick LARGE (A) NEGATIVE   Bilirubin Urine NEGATIVE NEGATIVE   Ketones, ur 5 (A) NEGATIVE mg/dL   Protein, ur NEGATIVE NEGATIVE mg/dL   Nitrite NEGATIVE NEGATIVE   Leukocytes, UA NEGATIVE NEGATIVE   RBC / HPF TOO NUMEROUS TO COUNT 0 - 5 RBC/hpf   WBC, UA NONE SEEN 0 - 5 WBC/hpf   Bacteria, UA RARE (A) NONE SEEN   Squamous Epithelial / LPF 0-5 (A) NONE SEEN  Pregnancy, urine POC     Status: None   Collection Time: 07/22/17  6:35 PM  Result Value Ref Range   Preg Test, Ur NEGATIVE NEGATIVE  CBC     Status: None   Collection Time: 07/22/17  6:55 PM  Result Value Ref Range   WBC 8.7 4.0 - 10.5 K/uL   RBC 4.59 3.87 - 5.11 MIL/uL   Hemoglobin 14.0 12.0 - 15.0 g/dL   HCT 16.1 09.6 - 04.5 %   MCV 85.0 78.0 - 100.0 fL   MCH 30.5 26.0 - 34.0 pg   MCHC 35.9 30.0 - 36.0 g/dL   RDW 40.9 81.1 - 91.4 %   Platelets 317 150 - 400 K/uL    MAU Course  Procedures None  MDM UPT - negative UA, CBC today  Depo Provera given in MAU today. Advised to let PCP know to schedule next injection.  Assessment and Plan  A: Breakthrough bleeding on Depo Provdera  P: Discharge home Ibuprofen PRN for pain  Depo Provera given today Bleeding precautions discussed Patient advised to follow-up with PCP for next injection in ~ 12 weeks Patient may return to MAU as needed or if her condition were to change or worsen   Vonzella Nipple, PA-C 07/22/2017, 7:21 PM

## 2017-08-31 ENCOUNTER — Other Ambulatory Visit: Payer: Self-pay

## 2017-08-31 ENCOUNTER — Encounter (HOSPITAL_COMMUNITY): Payer: Self-pay | Admitting: Emergency Medicine

## 2017-08-31 DIAGNOSIS — Z87891 Personal history of nicotine dependence: Secondary | ICD-10-CM | POA: Insufficient documentation

## 2017-08-31 DIAGNOSIS — Z794 Long term (current) use of insulin: Secondary | ICD-10-CM | POA: Insufficient documentation

## 2017-08-31 DIAGNOSIS — E1165 Type 2 diabetes mellitus with hyperglycemia: Secondary | ICD-10-CM | POA: Insufficient documentation

## 2017-08-31 DIAGNOSIS — G43009 Migraine without aura, not intractable, without status migrainosus: Secondary | ICD-10-CM | POA: Insufficient documentation

## 2017-08-31 LAB — CBG MONITORING, ED: GLUCOSE-CAPILLARY: 462 mg/dL — AB (ref 65–99)

## 2017-08-31 NOTE — ED Triage Notes (Signed)
Pt states that she checked her blood sugar around 10 pm tonight and was 547. She states she is supposed to take insulin but hasn't taken it because it ran out of date in October. CBG in triage 462.

## 2017-09-01 ENCOUNTER — Emergency Department (HOSPITAL_COMMUNITY)
Admission: EM | Admit: 2017-09-01 | Discharge: 2017-09-01 | Disposition: A | Discharge status: Home / Self Care | Payer: Self-pay | Attending: Emergency Medicine | Admitting: Emergency Medicine

## 2017-09-01 DIAGNOSIS — G43009 Migraine without aura, not intractable, without status migrainosus: Secondary | ICD-10-CM

## 2017-09-01 DIAGNOSIS — R739 Hyperglycemia, unspecified: Secondary | ICD-10-CM

## 2017-09-01 LAB — BASIC METABOLIC PANEL
Anion gap: 11 (ref 5–15)
BUN: 15 mg/dL (ref 6–20)
CO2: 22 mmol/L (ref 22–32)
CREATININE: 0.51 mg/dL (ref 0.44–1.00)
Calcium: 10.3 mg/dL (ref 8.9–10.3)
Chloride: 101 mmol/L (ref 101–111)
GFR calc Af Amer: >60 mL/min (ref >60)
Glucose, Bld: 500 mg/dL — ABNORMAL HIGH (ref 65–99)
POTASSIUM: 4.1 mmol/L (ref 3.5–5.1)
SODIUM: 134 mmol/L — AB (ref 135–145)

## 2017-09-01 LAB — URINALYSIS, ROUTINE W REFLEX MICROSCOPIC
Bilirubin Urine: NEGATIVE
Glucose, UA: >=500 mg/dL — AB
HGB URINE DIPSTICK: NEGATIVE
KETONES UR: 20 mg/dL — AB
Leukocytes, UA: NEGATIVE
NITRITE: POSITIVE — AB
Protein, ur: NEGATIVE mg/dL
Specific Gravity, Urine: 1.039 — ABNORMAL HIGH (ref 1.005–1.030)
pH: 5 (ref 5.0–8.0)

## 2017-09-01 LAB — CBC
HCT: 43.2 % (ref 36.0–46.0)
Hemoglobin: 15.1 g/dL — ABNORMAL HIGH (ref 12.0–15.0)
MCH: 30.4 pg (ref 26.0–34.0)
MCHC: 35 g/dL (ref 30.0–36.0)
MCV: 87.1 fL (ref 78.0–100.0)
Platelets: 319 10*3/uL (ref 150–400)
RBC: 4.96 MIL/uL (ref 3.87–5.11)
RDW: 12.2 % (ref 11.5–15.5)
WBC: 8 10*3/uL (ref 4.0–10.5)

## 2017-09-01 LAB — CBG MONITORING, ED
Glucose-Capillary: 331 mg/dL — ABNORMAL HIGH (ref 65–99)
Glucose-Capillary: 285 mg/dL — ABNORMAL HIGH (ref 65–99)
Glucose-Capillary: 323 mg/dL — ABNORMAL HIGH (ref 65–99)

## 2017-09-01 MED ORDER — INSULIN GLARGINE 100 UNIT/ML ~~LOC~~ SOLN: 20.0000 [IU] | Freq: Two times a day (BID) | SUBCUTANEOUS | 0 refills | Status: DC

## 2017-09-01 MED ORDER — INSULIN ASPART 100 UNIT/ML ~~LOC~~ SOLN
5.0000 [IU] | Freq: Once | SUBCUTANEOUS | Status: AC
  Administered 2017-09-01: 5 [IU] via INTRAVENOUS
  Filled 2017-09-01: qty 1

## 2017-09-01 MED ORDER — INSULIN LISPRO 100 UNIT/ML ~~LOC~~ SOLN: 10.0000 [IU] | Freq: Three times a day (TID) | SUBCUTANEOUS | 0 refills | Status: DC

## 2017-09-01 MED ORDER — SODIUM CHLORIDE 0.9 % IV BOLUS (SEPSIS)
1000.0000 mL | Freq: Once | INTRAVENOUS | Status: AC
  Administered 2017-09-01: 1000 mL via INTRAVENOUS

## 2017-09-01 MED ORDER — INSULIN ASPART 100 UNIT/ML ~~LOC~~ SOLN
7.0000 [IU] | Freq: Once | SUBCUTANEOUS | Status: AC
  Administered 2017-09-01: 7 [IU] via SUBCUTANEOUS
  Filled 2017-09-01: qty 1

## 2017-09-01 MED ORDER — DIPHENHYDRAMINE HCL 50 MG/ML IJ SOLN
25.0000 mg | Freq: Once | INTRAMUSCULAR | Status: AC
  Administered 2017-09-01: 25 mg via INTRAVENOUS
  Filled 2017-09-01: qty 1

## 2017-09-01 MED ORDER — METOCLOPRAMIDE HCL 5 MG/ML IJ SOLN
10.0000 mg | Freq: Once | INTRAMUSCULAR | Status: AC
  Administered 2017-09-01: 10 mg via INTRAVENOUS
  Filled 2017-09-01: qty 2

## 2017-09-01 MED ORDER — SODIUM CHLORIDE 0.9 % IV SOLN
INTRAVENOUS | Status: DC
  Filled 2017-09-01 (×2): qty 1

## 2017-09-01 NOTE — Discharge Instructions (Signed)
Start your insulin again. Try to monitor your blood sugar closely. Talk to your doctor about seeing an endocrinologist, Dr Fransico HimNida, to help you get better control of your diabetes.  Recheck if you get worse again.

## 2017-09-01 NOTE — ED Provider Notes (Signed)
[facility] EMERGENCY DEPARTMENT Provider Note   CSN: [id number] Arrival date & time: 08/31/17  2244  Time seen 02:20 AM   History   Chief Complaint Chief Complaint  Patient presents with  . Hyperglycemia    HPI Evelyn [name] is a 31 y.o. female.  HPI patient reports she has been a diabetic since 2008 and has been on insulin since 2014.  She states she lost her insurance and ran out of her medicine about 2-3 months ago.  When asked about how she feels she states "I am always thirsty", she has had polyuria for about a month with about 1-2 episodes of nocturia.  She denies weight loss.  She states she checked her blood sugar a few days ago and it was 320.  Tonight about 5 PM she started getting pain on the top of her head near the scalp line that she states is sharp and constant, she had nausea and vomited once.  She states her nausea is mild now.  She denies blurred vision, numbness or tingling of her extremities.  She states she gets headaches a lot at least weekly.  She states this is similar.  She states sometimes are so bad she has to come to the ED.  She states noise and sometimes the light is making her headache worse tonight, nothing has made it feel better.  She took Tylenol 1000 mg at 8 PM without relief.  She checked her blood sugar because of the headache and states her blood sugar was 547.  She states she has some insulin in her refrigerator but it expired in October and she was afraid to take it.  [facility]   Past Medical History:  Diagnosis Date  . Diabetes mellitus without complication (HCC)    Diagnosed in 2009; on insulin  . Heartburn in pregnancy   . History of trichomoniasis   . Infection    UTI  . Migraine    otc med prn    Patient Active Problem List   Diagnosis Date Noted  . S/P repeat low transverse C-section 08/06/2013  . Type 2 diabetes mellitus (HCC) 08/06/2013  . Migraines 12/23/2012  . History of trichomoniasis  12/23/2012  . HSV (herpes simplex virus) infection 12/23/2012  . IDDM 09/07/2010    Past Surgical History:  Procedure Laterality Date  . CESAREAN SECTION  2009  . DENTAL SURGERY     wisdom teeth ext    OB History    Gravida Para Term Preterm AB Living   2 2 2     2    SAB TAB Ectopic Multiple Live Births           2       Home Medications    Prior to Admission medications   Medication Sig Start Date End Date Taking? Authorizing Provider  ibuprofen (ADVIL,MOTRIN) 800 MG tablet Take 1 tablet (800 mg total) by mouth every 8 (eight) hours as needed for headache. 07/23/16   Lora PaulaKrall, Jennifer T, MD  insulin glargine (LANTUS) 100 UNIT/ML injection Inject 0.2 mLs (20 Units total) 2 (two) times daily into the skin. 09/01/17   Devoria AlbeKnapp, Rahmir Beever, MD  insulin lispro (HUMALOG) 100 UNIT/ML injection Inject 0.1 mLs (10 Units total) 3 (three) times daily before meals into the skin. 09/01/17   Devoria AlbeKnapp, Maila Dukes, MD  MedroxyPROGESTERone Acetate 150 MG/ML SUSY 1 mL every 3 (three) months. 06/26/16   [provider]  metoCLOPramide (REGLAN) 10 MG tablet Take 1 tablet (10  mg total) by mouth every 6 (six) hours as needed for nausea. 07/23/16   Lora Paula, MD    Family History Family History  Problem Relation Age of Onset  . Diabetes Mother   . Hypertension Mother   . Diabetes Father   . Birth defects Sister        heart was on wrong side  . Cancer Maternal Grandmother   . Alzheimer's disease Maternal Grandfather   . Cancer Paternal Grandmother     Social History Social History   Tobacco Use  . Smoking status: Former Smoker    Packs/day: 0.50    Years: 6.00    Pack years: 3.00    Types: Cigarettes    Last attempt to quit: 10/22/2012    Years since quitting: 4.8  . Smokeless tobacco: Never Used  Substance Use Topics  . Alcohol use: No    Comment: Occasional  . Drug use: No  employed   Allergies   Patient has no known allergies.   Review of Systems Review of Systems  All other  systems reviewed and are negative.    Physical Exam Updated Vital Signs BP (!) 129/92   Pulse (!) 117   Temp 98.4 F (36.9 C) (Oral)   Resp 17   Ht 5\' 4"  (1.626 m)   Wt 74.8 kg (165 lb)   SpO2 98%   BMI 28.32 kg/m   Vital signs normal except for tachycardia   Physical Exam  Constitutional: She is oriented to person, place, and time. She appears well-developed and well-nourished.  Non-toxic appearance. She does not appear ill. No distress.  HENT:  Head: Normocephalic and atraumatic.  Right Ear: External ear normal.  Left Ear: External ear normal.  Nose: Nose normal. No mucosal edema or rhinorrhea.  Mouth/Throat: Mucous membranes are dry. No dental abscesses or uvula swelling.  Eyes: Conjunctivae and EOM are normal. Pupils are equal, round, and reactive to light.  Neck: Normal range of motion and full passive range of motion without pain. Neck supple.  Cardiovascular: Normal rate, regular rhythm and normal heart sounds. Exam reveals no gallop and no friction rub.  No murmur heard. Pulmonary/Chest: Effort normal and breath sounds normal. No respiratory distress. She has no wheezes. She has no rhonchi. She has no rales. She exhibits no tenderness and no crepitus.  Abdominal: Normal appearance.  Musculoskeletal: Normal range of motion. She exhibits no edema or tenderness.  Moves all extremities well.   Neurological: She is alert and oriented to person, place, and time. She has normal strength. No cranial nerve deficit.  Skin: Skin is warm, dry and intact. No rash noted. No erythema. No pallor.  Psychiatric: She has a normal mood and affect. Her speech is normal and behavior is normal. Her mood appears not anxious.  Nursing note and vitals reviewed.    ED Treatments / Results  Labs (all labs ordered are listed, but only abnormal results are displayed) Results for orders placed or performed during the hospital encounter of 09/01/17  Basic metabolic panel  Result Value Ref  Range   Sodium 134 (L) 135 - 145 mmol/L   Potassium 4.1 3.5 - 5.1 mmol/L   Chloride 101 101 - 111 mmol/L   CO2 22 22 - 32 mmol/L   Glucose, Bld 500 (H) 65 - 99 mg/dL   BUN 15 6 - 20 mg/dL   Creatinine, Ser 1.61 0.44 - 1.00 mg/dL   Calcium 09.6 8.9 - 04.5 mg/dL   GFR calc non  Af Amer >60 >60 mL/min   GFR calc Af Amer >60 >60 mL/min   Anion gap 11 5 - 15  CBC  Result Value Ref Range   WBC 8.0 4.0 - 10.5 K/uL   RBC 4.96 3.87 - 5.11 MIL/uL   Hemoglobin 15.1 (H) 12.0 - 15.0 g/dL   HCT 40.943.2 81.136.0 - 91.446.0 %   MCV 87.1 78.0 - 100.0 fL   MCH 30.4 26.0 - 34.0 pg   MCHC 35.0 30.0 - 36.0 g/dL   RDW 78.212.2 95.611.5 - 21.315.5 %   Platelets 319 150 - 400 K/uL  CBG monitoring, ED  Result Value Ref Range   Glucose-Capillary 462 (H) 65 - 99 mg/dL  CBG monitoring, ED  Result Value Ref Range   Glucose-Capillary 331 (H) 65 - 99 mg/dL  POC CBG, ED  Result Value Ref Range   Glucose-Capillary 285 (H) 65 - 99 mg/dL  CBG monitoring, ED  Result Value Ref Range   Glucose-Capillary 323 (H) 65 - 99 mg/dL   Laboratory interpretation all normal except for hyperglycemia without metabolic acidosis    EKG  EKG Interpretation None       Radiology No results found.  Procedures Procedures (including critical care time)  Medications Ordered in ED Medications  insulin regular (NOVOLIN R,HUMULIN R) 100 Units in sodium chloride 0.9 % 100 mL (1 Units/mL) infusion (0 Units/hr Intravenous Hold 09/01/17 0321)  insulin aspart (novoLOG) injection 7 Units (not administered)  sodium chloride 0.9 % bolus 1,000 mL (0 mLs Intravenous Stopped 09/01/17 0544)  sodium chloride 0.9 % bolus 1,000 mL (0 mLs Intravenous Stopped 09/01/17 0544)  metoCLOPramide (REGLAN) injection 10 mg (10 mg Intravenous Given 09/01/17 0251)  diphenhydrAMINE (BENADRYL) injection 25 mg (25 mg Intravenous Given 09/01/17 0252)  insulin aspart (novoLOG) injection 5 Units (5 Units Intravenous Given 09/01/17 0326)     Initial Impression /  Assessment and Plan / ED Course  I have reviewed the triage vital signs and the nursing notes.  Pertinent labs & imaging results that were available during my care of the patient were reviewed by me and considered in my medical decision making (see chart for details).     Patient was started on IV fluids and given IV migraine cocktail.  She was prepared for an insulin drip.  3:20 AM nursing staff reports a repeat CBG shows her blood sugar is now in the 300s before they were able to start the insulin drip, she was given 5 units of insulin IV.  Patient was given IV insulin, her CBG improved into the high 200s,  6 AM patient states her headache is much improved, she states she feels better.  Her last CBG was in the low 300s.  She was given insulin subcu.  She states she is on Lantus 20 units twice a day and Humalog 10-15 units 3 times a day however she does not take that much.  She states she has no guidance in how to take her insulin.  She states her diabetes has been poorly controlled.  We discussed talking to her primary care doctor about referral to an endocrinologist to help her get better control of her diabetes.  She was given a subcu dose of insulin prior to being discharged.  Final Clinical Impressions(s) / ED Diagnoses   Final diagnoses:  Hyperglycemia  Migraine without aura and without status migrainosus, not intractable    ED Discharge Orders        Ordered    insulin glargine (LANTUS)  100 UNIT/ML injection  2 times daily     09/01/17 0606    insulin lispro (HUMALOG) 100 UNIT/ML injection  3 times daily before meals     09/01/17 0606      Plan discharge  Devoria Albe, MD, Concha Pyo, MD 09/01/17 [phone]

## 2018-08-20 ENCOUNTER — Ambulatory Visit (HOSPITAL_COMMUNITY)
Admission: RE | Admit: 2018-08-20 | Discharge: 2018-08-20 | Disposition: A | Discharge status: Home / Self Care | Payer: Medicaid Other | Source: Ambulatory Visit | Attending: *Deleted | Admitting: *Deleted

## 2018-08-20 ENCOUNTER — Other Ambulatory Visit (HOSPITAL_COMMUNITY): Payer: Self-pay | Admitting: *Deleted

## 2018-08-20 DIAGNOSIS — R0789 Other chest pain: Secondary | ICD-10-CM | POA: Insufficient documentation

## 2018-08-20 DIAGNOSIS — R05 Cough: Secondary | ICD-10-CM

## 2018-08-20 DIAGNOSIS — R059 Cough, unspecified: Secondary | ICD-10-CM

## 2019-07-01 ENCOUNTER — Encounter (HOSPITAL_COMMUNITY): Payer: Self-pay

## 2019-07-01 ENCOUNTER — Inpatient Hospital Stay (HOSPITAL_COMMUNITY)
Admission: AD | Admit: 2019-07-01 | Discharge: 2019-07-01 | Disposition: A | Discharge status: Home / Self Care | Payer: Medicaid Other | Attending: Obstetrics and Gynecology | Admitting: Obstetrics and Gynecology

## 2019-07-01 ENCOUNTER — Emergency Department (HOSPITAL_COMMUNITY)
Admission: EM | Admit: 2019-07-01 | Discharge: 2019-07-01 | Disposition: A | Discharge status: Home / Self Care | Payer: Self-pay | Attending: Emergency Medicine | Admitting: Emergency Medicine

## 2019-07-01 ENCOUNTER — Other Ambulatory Visit: Payer: Self-pay

## 2019-07-01 ENCOUNTER — Emergency Department (HOSPITAL_COMMUNITY): Payer: Self-pay

## 2019-07-01 DIAGNOSIS — Z794 Long term (current) use of insulin: Secondary | ICD-10-CM | POA: Insufficient documentation

## 2019-07-01 DIAGNOSIS — Z87891 Personal history of nicotine dependence: Secondary | ICD-10-CM | POA: Insufficient documentation

## 2019-07-01 DIAGNOSIS — R1030 Lower abdominal pain, unspecified: Secondary | ICD-10-CM | POA: Insufficient documentation

## 2019-07-01 DIAGNOSIS — Z3202 Encounter for pregnancy test, result negative: Secondary | ICD-10-CM

## 2019-07-01 DIAGNOSIS — E119 Type 2 diabetes mellitus without complications: Secondary | ICD-10-CM | POA: Insufficient documentation

## 2019-07-01 DIAGNOSIS — Z79899 Other long term (current) drug therapy: Secondary | ICD-10-CM | POA: Insufficient documentation

## 2019-07-01 LAB — CBC
HCT: 41.1 % (ref 36.0–46.0)
Hemoglobin: 13.2 g/dL (ref 12.0–15.0)
MCH: 28.4 pg (ref 26.0–34.0)
MCHC: 32.1 g/dL (ref 30.0–36.0)
MCV: 88.4 fL (ref 80.0–100.0)
Platelets: 400 10*3/uL (ref 150–400)
RBC: 4.65 MIL/uL (ref 3.87–5.11)
RDW: 13 % (ref 11.5–15.5)
WBC: 10.2 10*3/uL (ref 4.0–10.5)
nRBC: 0 % (ref 0.0–0.2)

## 2019-07-01 LAB — URINALYSIS, ROUTINE W REFLEX MICROSCOPIC
Bacteria, UA: NONE SEEN
Bilirubin Urine: NEGATIVE
Glucose, UA: >=500 mg/dL — AB
Ketones, ur: 20 mg/dL — AB
Leukocytes,Ua: NEGATIVE
Nitrite: NEGATIVE
Protein, ur: NEGATIVE mg/dL
RBC / HPF: >50 RBC/hpf — ABNORMAL HIGH (ref 0–5)
Specific Gravity, Urine: 1.035 — ABNORMAL HIGH (ref 1.005–1.030)
pH: 6 (ref 5.0–8.0)

## 2019-07-01 LAB — COMPREHENSIVE METABOLIC PANEL
ALT: 21 U/L (ref 0–44)
AST: 21 U/L (ref 15–41)
Albumin: 3.7 g/dL (ref 3.5–5.0)
Alkaline Phosphatase: 60 U/L (ref 38–126)
Anion gap: 12 (ref 5–15)
BUN: 14 mg/dL (ref 6–20)
CO2: 21 mmol/L — ABNORMAL LOW (ref 22–32)
Calcium: 8.8 mg/dL — ABNORMAL LOW (ref 8.9–10.3)
Chloride: 100 mmol/L (ref 98–111)
Creatinine, Ser: 0.37 mg/dL — ABNORMAL LOW (ref 0.44–1.00)
GFR calc Af Amer: >60 mL/min (ref >60)
GFR calc non Af Amer: >60 mL/min (ref >60)
Glucose, Bld: 300 mg/dL — ABNORMAL HIGH (ref 70–99)
Potassium: 3.6 mmol/L (ref 3.5–5.1)
Sodium: 133 mmol/L — ABNORMAL LOW (ref 135–145)
Total Bilirubin: 0.7 mg/dL (ref 0.3–1.2)
Total Protein: 6.8 g/dL (ref 6.5–8.1)

## 2019-07-01 LAB — WET PREP, GENITAL
Clue Cells Wet Prep HPF POC: NONE SEEN
Sperm: NONE SEEN
Trich, Wet Prep: NONE SEEN
Yeast Wet Prep HPF POC: NONE SEEN

## 2019-07-01 LAB — HCG, QUANTITATIVE, PREGNANCY: hCG, Beta Chain, Quant, S: <1 m[IU]/mL (ref <5)

## 2019-07-01 LAB — CBG MONITORING, ED: Glucose-Capillary: 318 mg/dL — ABNORMAL HIGH (ref 70–99)

## 2019-07-01 LAB — LIPASE, BLOOD: Lipase: 29 U/L (ref 11–51)

## 2019-07-01 LAB — PREGNANCY, URINE: Preg Test, Ur: NEGATIVE

## 2019-07-01 LAB — POCT PREGNANCY, URINE: Preg Test, Ur: NEGATIVE

## 2019-07-01 MED ORDER — IOHEXOL 300 MG/ML  SOLN
100.0000 mL | Freq: Once | INTRAMUSCULAR | Status: AC | PRN
  Administered 2019-07-01: 16:00:00 100 mL via INTRAVENOUS

## 2019-07-01 MED ORDER — KETOROLAC TROMETHAMINE 30 MG/ML IJ SOLN
30.0000 mg | Freq: Once | INTRAMUSCULAR | Status: AC
  Administered 2019-07-01: 17:00:00 30 mg via INTRAVENOUS
  Filled 2019-07-01: qty 1

## 2019-07-01 MED ORDER — IBUPROFEN 800 MG PO TABS: 800.0000 mg | ORAL_TABLET | Freq: Three times a day (TID) | ORAL | 0 refills | Status: DC

## 2019-07-01 NOTE — Discharge Instructions (Addendum)
Apply heat on/off to your lower abdomen.  Call [facility] to arrange a follow-up appt.  Return here for any worsening symptoms such as increasing pain, vomiting, or fever.

## 2019-07-01 NOTE — ED Notes (Signed)
Patient transported to CT 

## 2019-07-01 NOTE — ED Triage Notes (Signed)
On Monday pt started having lower abdominal pain. Denies any nausea or vomiting. States she started having vaginal bleeding as well. NAD

## 2019-07-01 NOTE — MAU Provider Note (Addendum)
Chief Complaint: Abdominal Pain   First Provider Initiated Contact with Patient 07/01/19 1050      SUBJECTIVE HPI: Evelyn [name] is a 33 y.o. [id number] who presents to maternity admissions for lower abdominal pain and watery bleeding since Monday. The patient has not taken anything for pain. She rates her pain at 8/10 now. She called her PCP who told her to come here for evaluation. LMP was 05/30/19. She denies N/V/D or fever.     Past Medical History:  Diagnosis Date  . Diabetes mellitus without complication ([place])    Diagnosed in 2009; on insulin  . Heartburn in pregnancy   . History of trichomoniasis   . Infection    UTI  . Migraine    otc med prn   Past Surgical History:  Procedure Laterality Date  . CESAREAN SECTION  2009  . CESAREAN SECTION N/A 08/04/2013   Procedure: REPEAT CESAREAN SECTION;  Surgeon: Logan Bores, MD;  Location: [place] [facility];  Service: Obstetrics;  Laterality: N/A;  . DENTAL SURGERY     wisdom teeth ext   Social History   Socioeconomic History  . Marital status: Single    Spouse name: Not on file  . Number of children: Not on file  . Years of education: Not on file  . Highest education level: Not on file  Occupational History  . Not on file  Social Needs  . Financial resource strain: Not on file  . Food insecurity    Worry: Not on file    Inability: Not on file  . Transportation needs    Medical: Not on file    Non-medical: Not on file  Tobacco Use  . Smoking status: Former Smoker    Packs/day: 0.50    Years: 6.00    Pack years: 3.00    Types: Cigarettes    Quit date: 10/22/2012    Years since quitting: 6.6  . Smokeless tobacco: Never Used  Substance and Sexual Activity  . Alcohol use: No    Comment: Occasional  . Drug use: No  . Sexual activity: Yes    Birth control/protection: Injection    Comment: pregnant  Lifestyle  . Physical activity    Days per week: Not on file    Minutes per session: Not on file  . Stress: Not on file   Relationships  . Social Herbalist on phone: Not on file    Gets together: Not on file    Attends religious service: Not on file    Active member of club or organization: Not on file    Attends meetings of clubs or organizations: Not on file    Relationship status: Not on file  . Intimate partner violence    Fear of current or ex partner: Not on file    Emotionally abused: Not on file    Physically abused: Not on file    Forced sexual activity: Not on file  Other Topics Concern  . Not on file  Social History Narrative  . Not on file   No current facility-administered medications on file prior to encounter.    Current Outpatient Medications on File Prior to Encounter  Medication Sig Dispense Refill  . ibuprofen (ADVIL,MOTRIN) 800 MG tablet Take 1 tablet (800 mg total) by mouth every 8 (eight) hours as needed for headache. 15 tablet 0  . insulin glargine (LANTUS) 100 UNIT/ML injection Inject 0.2 mLs (20 Units total) 2 (two) times daily into the skin. [place]  mL 0  . insulin lispro (HUMALOG) 100 UNIT/ML injection Inject 0.1 mLs (10 Units total) 3 (three) times daily before meals into the skin. 20 mL 0  . MedroxyPROGESTERone Acetate 150 MG/ML SUSY 1 mL every 3 (three) months.  5  . metoCLOPramide (REGLAN) 10 MG tablet Take 1 tablet (10 mg total) by mouth every 6 (six) hours as needed for nausea. 6 tablet 0   No Known Allergies  ROS:  Review of Systems  Constitutional: Negative for fever.  Gastrointestinal: Positive for abdominal pain. Negative for constipation, diarrhea, nausea and vomiting.  Genitourinary: Positive for pelvic pain and vaginal bleeding. Negative for vaginal discharge.    I have reviewed patient's Past Medical Hx, Surgical Hx, Family Hx, Social Hx, medications and allergies.   Physical Exam   Patient Vitals for the past 24 hrs:  BP Temp Pulse Resp  07/01/19 1037 135/75 98.2 F (36.8 C) 91 16   Physical Exam  Nursing note and vitals  reviewed. Constitutional: She is oriented to person, place, and time. She appears well-developed and well-nourished. No distress.  HENT:  Head: Normocephalic and atraumatic.  Cardiovascular: Normal rate.  Respiratory: Effort normal.  GI: Soft. She exhibits no distension.  Neurological: She is alert and oriented to person, place, and time.  Skin: Skin is warm and dry. No erythema.  Psychiatric: She has a normal mood and affect.   Results for orders placed or performed during the hospital encounter of 07/01/19 (from the past 24 hour(s))  Pregnancy, urine POC     Status: None   Collection Time: 07/01/19 10:38 AM  Result Value Ref Range   Preg Test, Ur NEGATIVE NEGATIVE    MDM Patient denies any concerning symptoms in need of emergent evaluation. Patient advised that [facility] or she may choose to be discharged to seek non-emergent evaluation of her complaint Urgent care or her PCP. The patient states that she will call back to her PCP office to ask for them where she should go as she does not [facility] right [facility].   ASSESSMENT MSE Complete  PLAN Discharged patient in stable condition at her request to seek non-emergent medical care elsewhere  Kathlene CoteWenzel, Zaeden Lastinger N, PA-C 07/01/2019 10:51 AM

## 2019-07-01 NOTE — MAU Note (Signed)
.   JOURNEE BOBROWSKI is a 33 y.o.  here in MAU reporting: lower abdominal cramping since yesterday with red watery vaginal discharge. LMP: 05/30/19 Onset of complaint: yesterday Pain score: 9 Vitals:   07/01/19 1037  BP: 135/75  Pulse: 91  Resp: 16  Temp: 98.2 F (36.8 C)     Lab orders placed from triage: UPT

## 2019-07-01 NOTE — Discharge Instructions (Signed)
Abdominal Pain, Adult    Many things can cause belly (abdominal) pain. Most times, belly pain is not dangerous. Many cases of belly pain can be watched and treated at home. Sometimes belly pain is serious, though. Your doctor will try to find the cause of your belly pain.  Follow these instructions at home:  · Take over-the-counter and prescription medicines only as told by your doctor. Do not take medicines that help you poop (laxatives) unless told to by your doctor.  · Drink enough fluid to keep your pee (urine) clear or pale yellow.  · Watch your belly pain for any changes.  · Keep all follow-up visits as told by your doctor. This is important.  Contact a doctor if:  · Your belly pain changes or gets worse.  · You are not hungry, or you lose weight without trying.  · You are having trouble pooping (constipated) or have watery poop (diarrhea) for more than 2-3 days.  · You have pain when you pee or poop.  · Your belly pain wakes you up at night.  · Your pain gets worse with meals, after eating, or with certain foods.  · You are throwing up and cannot keep anything down.  · You have a fever.  Get help right away if:  · Your pain does not go away as soon as your doctor says it should.  · You cannot stop throwing up.  · Your pain is only in areas of your belly, such as the right side or the left lower part of the belly.  · You have bloody or black poop, or poop that looks like tar.  · You have very bad pain, cramping, or bloating in your belly.  · You have signs of not having enough fluid or water in your body (dehydration), such as:  ? Dark pee, very little pee, or no pee.  ? Cracked lips.  ? Dry mouth.  ? Sunken eyes.  ? Sleepiness.  ? Weakness.  This information is not intended to replace advice given to you by your health care provider. Make sure you discuss any questions you have with your health care provider.  Document Released: 03/26/2008 Document Revised: 04/27/2016 Document Reviewed: 03/21/2016  [employer]vier  Interactive Patient Education © 2020 [employer]

## 2019-07-01 NOTE — ED Provider Notes (Signed)
[facility] EMERGENCY DEPARTMENT Provider Note   CSN: [id number] Arrival date & time: 07/01/19  1136     History   Chief Complaint Chief Complaint  Patient presents with  . Abdominal Pain    HPI Lyndal PulleyGloria E Bradner is a 33 y.o. female.     HPI   Lyndal PulleyGloria E Colla is a 33 y.o. female who presents to the Emergency Department complaining of gradually worsening lower abdominal pain.  She states the initially her pain was across her upper abdomen.  Today, she states the pain feels crampy and in her lower abdomen.  She states that her menstrual cycle also began last evening.  She describes the bleeding as lighter than usual.  Abdominal pain is constant and nonradiating.  Pain is not affected with food intake although she does admit to decreased appetite.  She denies fever, chills, back pain, dysuria,  Vomiting, diarrhea and vaginal discharge.  She states that she stopped her birth control medications about 6 months ago and she is currently trying to become pregnant.   Past Medical History:  Diagnosis Date  . Diabetes mellitus without complication (HCC)    Diagnosed in 2009; on insulin  . Heartburn in pregnancy   . History of trichomoniasis   . Infection    UTI  . Migraine    otc med prn    Patient Active Problem List   Diagnosis Date Noted  . S/P repeat low transverse C-section 08/06/2013  . Type 2 diabetes mellitus (HCC) 08/06/2013  . Migraines 12/23/2012  . History of trichomoniasis 12/23/2012  . HSV (herpes simplex virus) infection 12/23/2012  . IDDM 09/07/2010    Past Surgical History:  Procedure Laterality Date  . CESAREAN SECTION  2009  . CESAREAN SECTION N/A 08/04/2013   Procedure: REPEAT CESAREAN SECTION;  Surgeon: Oliver PilaKathy W Richardson, MD;  Location: [facility];  Service: Obstetrics;  Laterality: N/A;  . DENTAL SURGERY     wisdom teeth ext     OB History    Gravida  2   Para  2   Term  2   Preterm      AB      Living  2     SAB      TAB      Ectopic     Multiple      Live Births  2            Home Medications    Prior to Admission medications   Medication Sig Start Date End Date Taking? Authorizing Provider  ibuprofen (ADVIL,MOTRIN) 800 MG tablet Take 1 tablet (800 mg total) by mouth every 8 (eight) hours as needed for headache. 07/23/16   Lora PaulaKrall, Jennifer T, MD  insulin glargine (LANTUS) 100 UNIT/ML injection Inject 0.2 mLs (20 Units total) 2 (two) times daily into the skin. 09/01/17   Devoria AlbeKnapp, Iva, MD  insulin lispro (HUMALOG) 100 UNIT/ML injection Inject 0.1 mLs (10 Units total) 3 (three) times daily before meals into the skin. 09/01/17   Devoria AlbeKnapp, Iva, MD  MedroxyPROGESTERone Acetate 150 MG/ML SUSY 1 mL every 3 (three) months. 06/26/16   [provider]  metoCLOPramide (REGLAN) 10 MG tablet Take 1 tablet (10 mg total) by mouth every 6 (six) hours as needed for nausea. 07/23/16   Lora PaulaKrall, Jennifer T, MD    Family History Family History  Problem Relation Age of Onset  . Diabetes Mother   . Hypertension Mother   . Diabetes Father   . Birth defects Sister  heart was on wrong side  . Cancer Maternal Grandmother   . Alzheimer's disease Maternal Grandfather   . Cancer Paternal Grandmother     Social History Social History   Tobacco Use  . Smoking status: Former Smoker    Packs/day: 0.50    Years: 6.00    Pack years: 3.00    Types: Cigarettes    Quit date: 10/22/2012    Years since quitting: 6.6  . Smokeless tobacco: Never Used  Substance Use Topics  . Alcohol use: No    Comment: Occasional  . Drug use: No     Allergies   Patient has no known allergies.   Review of Systems Review of Systems  Constitutional: Positive for appetite change. Negative for chills and fever.  Respiratory: Negative for shortness of breath.   Cardiovascular: Negative for chest pain.  Gastrointestinal: Positive for abdominal pain. Negative for blood in stool, diarrhea, nausea and vomiting.  Genitourinary: Positive for vaginal  bleeding. Negative for decreased urine volume, difficulty urinating, dysuria, flank pain and vaginal discharge.  Musculoskeletal: Negative for back pain.  Skin: Negative for color change and rash.  Neurological: Negative for dizziness, syncope, weakness and numbness.  Hematological: Negative for adenopathy.     Physical Exam Updated Vital Signs BP 112/88 (BP Location: Right Arm)   Pulse 90   Temp 98.1 F (36.7 C) (Oral)   Resp 18   Ht 5\' 4"  (1.626 m)   Wt 68 kg   LMP 05/23/2019   SpO2 95%   BMI 25.75 kg/m   Physical Exam Vitals signs and nursing note reviewed.  Constitutional:      General: She is not in acute distress.    Appearance: She is well-developed. She is not ill-appearing or toxic-appearing.  HENT:     Head: Atraumatic.     Mouth/Throat:     Mouth: Mucous membranes are moist.  Cardiovascular:     Rate and Rhythm: Normal rate and regular rhythm.     Pulses: Normal pulses.  Pulmonary:     Effort: Pulmonary effort is normal. No respiratory distress.     Breath sounds: Normal breath sounds.  Abdominal:     General: Bowel sounds are normal. There is no distension.     Palpations: Abdomen is soft. There is no mass.     Tenderness: There is abdominal tenderness in the suprapubic area. There is no right CVA tenderness, left CVA tenderness, guarding or rebound. Negative signs include McBurney's sign.  Genitourinary:    Labia:        Right: No rash.        Left: No rash.      Vagina: Bleeding present. No tenderness.     Cervix: No cervical motion tenderness.     Uterus: Not enlarged and not tender.      Adnexa:        Right: No mass or tenderness.         Left: No mass or tenderness.       Comments: Pelvic exam assisted by nursing.  Moderate amt of blood in vaginal vault.  No clots.  No CMT Musculoskeletal: Normal range of motion.     Right lower leg: No edema.     Left lower leg: No edema.  Skin:    General: Skin is warm.     Findings: No rash.   Neurological:     General: No focal deficit present.     Mental Status: She is alert and oriented to person,  place, and time.     Sensory: No sensory deficit.     Motor: No weakness or abnormal muscle tone.  Psychiatric:        Mood and Affect: Mood normal.      ED Treatments / Results  Labs (all labs ordered are listed, but only abnormal results are displayed) Labs Reviewed  WET PREP, GENITAL - Abnormal; Notable for the following components:      Result Value   WBC, Wet Prep HPF POC FEW (*)    All other components within normal limits  URINALYSIS, ROUTINE W REFLEX MICROSCOPIC - Abnormal; Notable for the following components:   APPearance HAZY (*)    Specific Gravity, Urine 1.035 (*)    Glucose, UA >=500 (*)    Hgb urine dipstick LARGE (*)    Ketones, ur 20 (*)    RBC / HPF >50 (*)    All other components within normal limits  COMPREHENSIVE METABOLIC PANEL - Abnormal; Notable for the following components:   Sodium 133 (*)    CO2 21 (*)    Glucose, Bld 300 (*)    Creatinine, Ser 0.37 (*)    Calcium 8.8 (*)    All other components within normal limits  CBG MONITORING, ED - Abnormal; Notable for the following components:   Glucose-Capillary 318 (*)    All other components within normal limits  URINE CULTURE  PREGNANCY, URINE  CBC  HCG, QUANTITATIVE, PREGNANCY  LIPASE, BLOOD  GC/CHLAMYDIA PROBE AMP ([facility]) NOT AT [facility]    EKG None  Radiology Ct Abdomen Pelvis W Contrast  Result Date: 07/01/2019 CLINICAL DATA:  Abdominal pain, generalized. EXAM: CT ABDOMEN AND PELVIS WITH CONTRAST TECHNIQUE: Multidetector CT imaging of the abdomen and pelvis was performed using the standard protocol following bolus administration of intravenous contrast. CONTRAST:  OMNIPAQUE IOHEXOL 300 MG/ML  SOLN COMPARISON:  Ultrasound of the abdomen May 18, 2014 FINDINGS: Lower chest: No acute abnormality. Hepatobiliary: No focal liver abnormality is seen. No gallstones, gallbladder wall  thickening, or biliary dilatation. Pancreas: Unremarkable. No pancreatic ductal dilatation or surrounding inflammatory changes. Spleen: Normal in size without focal abnormality. Adrenals/Urinary Tract: Adrenal glands are unremarkable. Kidneys are normal, without renal calculi, focal lesion, or hydronephrosis. Bladder is unremarkable. Stomach/Bowel: Stomach is within normal limits. Appendix appears normal. No evidence of bowel wall thickening, distention, or inflammatory changes. Scattered left colonic diverticulosis. No evidence of diverticulitis. Vascular/Lymphatic: No significant vascular findings are present. No enlarged abdominal or pelvic lymph nodes. Reproductive: Uterus and bilateral adnexa are unremarkable. Other: No abdominal wall hernia or abnormality. No abdominopelvic ascites. 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 IMPRESSION: 1. No evidence of acute abnormality within the abdomen or pelvis. 2. Scattered left colonic diverticulosis without evidence of diverticulitis. 3. 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 Electronically Signed   By: Ted Mcalpine M.D.   On: 07/01/2019 16:38    Procedures Procedures (including critical care time)  Medications Ordered in ED Medications - No data to display   Initial Impression / Assessment and Plan / ED Course  I have reviewed the triage vital signs and the nursing notes.  Pertinent labs & imaging results that were available during my care of the patient were reviewed by me and considered in my medical decision making (see chart for details).        Pt with mid lower abdominal pain.  Work up today including CT abd/pelvis is reassuring.  U/A shows  hematuria, likely contaminant, urine culture pending.   Pt is well appearing.  She is hyperglycemic, no sx's of DKA.  Vitals reviewed.  Pt appropriate for d/c home.  Agrees to close out pt f/u.   Strict return precautions discussed.  Final Clinical Impressions(s) / ED Diagnoses   Final diagnoses:  Lower abdominal pain    ED Discharge Orders    None       Pauline Ausriplett, Ramie Erman, PA-C 07/01/19 2152    Maia PlanLong, Joshua G, MD 07/02/19 [phone]

## 2019-07-02 LAB — GC/CHLAMYDIA PROBE AMP (~~LOC~~) NOT AT ARMC
Chlamydia: NEGATIVE
Neisseria Gonorrhea: NEGATIVE

## 2019-07-03 LAB — URINE CULTURE: Culture: 2000 — AB

## 2019-07-04 ENCOUNTER — Telehealth: Payer: Self-pay | Admitting: Emergency Medicine

## 2019-07-04 NOTE — Telephone Encounter (Signed)
Post ED Visit - Positive Culture Follow-up  Culture report reviewed by antimicrobial stewardship pharmacist: [place] Team []  Elenor Quinones, Pharm.D. []  Heide Guile, Pharm.D., BCPS AQ-ID []  Parks Neptune, Pharm.D., BCPS []  Alycia Rossetti, Pharm.D., BCPS []  [place], Pharm.D., BCPS, AAHIVP []  Legrand Como, Pharm.D., BCPS, AAHIVP []  Salome Arnt, PharmD, BCPS []  Johnnette Gourd, PharmD, BCPS []  Hughes Better, PharmD, BCPS [x]  Antonietta Jewel, PharmD []  Laqueta Linden, PharmD, BCPS []  Albertina Parr, PharmD  [place] Team []  Leodis Sias, PharmD []  Lindell Spar, PharmD []  Royetta Asal, PharmD []  Graylin Shiver, Rph []  Rema Fendt) Glennon Mac, PharmD []  Arlyn Dunning, PharmD []  Netta Cedars, PharmD []  Dia Sitter, PharmD []  Leone Haven, PharmD []  Gretta Arab, PharmD []  Theodis Shove, PharmD []  Peggyann Juba, PharmD []  Reuel Boom, PharmD   Positive urine culture No further patient follow-up is required at this time.  [name] [name] 07/04/2019, 10:36 AM

## 2020-02-15 ENCOUNTER — Emergency Department (HOSPITAL_COMMUNITY): Payer: Self-pay

## 2020-02-15 ENCOUNTER — Emergency Department (HOSPITAL_COMMUNITY)
Admission: EM | Admit: 2020-02-15 | Discharge: 2020-02-15 | Disposition: A | Discharge status: Home / Self Care | Payer: Self-pay | Attending: Emergency Medicine | Admitting: Emergency Medicine

## 2020-02-15 ENCOUNTER — Other Ambulatory Visit: Payer: Self-pay

## 2020-02-15 ENCOUNTER — Encounter (HOSPITAL_COMMUNITY): Payer: Self-pay

## 2020-02-15 DIAGNOSIS — R739 Hyperglycemia, unspecified: Secondary | ICD-10-CM

## 2020-02-15 DIAGNOSIS — E1165 Type 2 diabetes mellitus with hyperglycemia: Secondary | ICD-10-CM | POA: Insufficient documentation

## 2020-02-15 DIAGNOSIS — F17210 Nicotine dependence, cigarettes, uncomplicated: Secondary | ICD-10-CM | POA: Insufficient documentation

## 2020-02-15 DIAGNOSIS — B9689 Other specified bacterial agents as the cause of diseases classified elsewhere: Secondary | ICD-10-CM

## 2020-02-15 DIAGNOSIS — Z794 Long term (current) use of insulin: Secondary | ICD-10-CM | POA: Insufficient documentation

## 2020-02-15 DIAGNOSIS — N760 Acute vaginitis: Secondary | ICD-10-CM | POA: Insufficient documentation

## 2020-02-15 DIAGNOSIS — R102 Pelvic and perineal pain: Secondary | ICD-10-CM

## 2020-02-15 LAB — COMPREHENSIVE METABOLIC PANEL
ALT: 18 U/L (ref 0–44)
AST: 19 U/L (ref 15–41)
Albumin: 4 g/dL (ref 3.5–5.0)
Alkaline Phosphatase: 66 U/L (ref 38–126)
Anion gap: 12 (ref 5–15)
BUN: 13 mg/dL (ref 6–20)
CO2: 23 mmol/L (ref 22–32)
Calcium: 9.1 mg/dL (ref 8.9–10.3)
Chloride: 97 mmol/L — ABNORMAL LOW (ref 98–111)
Creatinine, Ser: 0.49 mg/dL (ref 0.44–1.00)
GFR calc Af Amer: >60 mL/min (ref >60)
GFR calc non Af Amer: >60 mL/min (ref >60)
Glucose, Bld: 461 mg/dL — ABNORMAL HIGH (ref 70–99)
Potassium: 4 mmol/L (ref 3.5–5.1)
Sodium: 132 mmol/L — ABNORMAL LOW (ref 135–145)
Total Bilirubin: 0.5 mg/dL (ref 0.3–1.2)
Total Protein: 7.2 g/dL (ref 6.5–8.1)

## 2020-02-15 LAB — CBC WITH DIFFERENTIAL/PLATELET
Abs Immature Granulocytes: 0.01 10*3/uL (ref 0.00–0.07)
Basophils Absolute: 0 10*3/uL (ref 0.0–0.1)
Basophils Relative: 1 %
Eosinophils Absolute: 0 10*3/uL (ref 0.0–0.5)
Eosinophils Relative: 0 %
HCT: 44 % (ref 36.0–46.0)
Hemoglobin: 14.1 g/dL (ref 12.0–15.0)
Immature Granulocytes: 0 %
Lymphocytes Relative: 22 %
Lymphs Abs: 1.1 10*3/uL (ref 0.7–4.0)
MCH: 27.9 pg (ref 26.0–34.0)
MCHC: 32 g/dL (ref 30.0–36.0)
MCV: 87.1 fL (ref 80.0–100.0)
Monocytes Absolute: 0.5 10*3/uL (ref 0.1–1.0)
Monocytes Relative: 11 %
Neutro Abs: 3.3 10*3/uL (ref 1.7–7.7)
Neutrophils Relative %: 66 %
Platelets: 337 10*3/uL (ref 150–400)
RBC: 5.05 MIL/uL (ref 3.87–5.11)
RDW: 12.9 % (ref 11.5–15.5)
WBC: 4.9 10*3/uL (ref 4.0–10.5)
nRBC: 0 % (ref 0.0–0.2)

## 2020-02-15 LAB — URINALYSIS, ROUTINE W REFLEX MICROSCOPIC
Bilirubin Urine: NEGATIVE
Glucose, UA: >=500 mg/dL — AB
Ketones, ur: 20 mg/dL — AB
Nitrite: NEGATIVE
Protein, ur: NEGATIVE mg/dL
Specific Gravity, Urine: 1.038 — ABNORMAL HIGH (ref 1.005–1.030)
pH: 6 (ref 5.0–8.0)

## 2020-02-15 LAB — WET PREP, GENITAL
Sperm: NONE SEEN
Trich, Wet Prep: NONE SEEN
Yeast Wet Prep HPF POC: NONE SEEN

## 2020-02-15 LAB — CBG MONITORING, ED
Glucose-Capillary: 417 mg/dL — ABNORMAL HIGH (ref 70–99)
Glucose-Capillary: 268 mg/dL — ABNORMAL HIGH (ref 70–99)

## 2020-02-15 LAB — HIV ANTIBODY (ROUTINE TESTING W REFLEX): HIV Screen 4th Generation wRfx: NONREACTIVE

## 2020-02-15 LAB — HCG, SERUM, QUALITATIVE: Preg, Serum: NEGATIVE

## 2020-02-15 LAB — LIPASE, BLOOD: Lipase: 21 U/L (ref 11–51)

## 2020-02-15 MED ORDER — METRONIDAZOLE 500 MG PO TABS: 500.0000 mg | ORAL_TABLET | Freq: Two times a day (BID) | ORAL | 0 refills | Status: DC

## 2020-02-15 MED ORDER — FLUCONAZOLE 150 MG PO TABS: ORAL_TABLET | ORAL | 0 refills | Status: DC

## 2020-02-15 MED ORDER — INSULIN ASPART 100 UNIT/ML ~~LOC~~ SOLN
10.0000 [IU] | Freq: Once | SUBCUTANEOUS | Status: AC
  Administered 2020-02-15: 12:00:00 10 [IU] via SUBCUTANEOUS
  Filled 2020-02-15: qty 1

## 2020-02-15 MED ORDER — SODIUM CHLORIDE 0.9 % IV BOLUS
1000.0000 mL | Freq: Once | INTRAVENOUS | Status: AC
  Administered 2020-02-15: 1000 mL via INTRAVENOUS

## 2020-02-15 NOTE — ED Provider Notes (Signed)
[facility] EMERGENCY DEPARTMENT Provider Note   CSN: [id number] Arrival date & time: 02/15/20  0920     History Chief Complaint  Patient presents with  . Abdominal Pain    Evelyn [name] is a 34 y.o. female with PMHx diabetes who presents to the ED today with complaint of gradual onset, intermittent, lower abdominal/pelvic cramping x 2 months. Pt reports that the pain started after starting her menstrual cycle 2 months ago. She states that since then she has had abdominal cramping even when she is not menstruating. LNMP 04/10. She has been taking Ibuprofen and Tylenol without relief. Pt reports that this morning she woke up in so much pain she decided to come here for answers. Pt is sexually active; reports she recently got out of a 3 year long relationship after she found out her partner was not faithful. Pt did have unprotected intercourse with another female this past weekend. She denies fevers, chills, nausea, vomiting, diarrhea, constipation, blood in stool, urinary sx, vaginal discharge, or any other associated symptoms.   Per chart review: Pt was seen in the ED on 07/01/2019 for pelvic pain/lower abdominal pain. ED workup included: U/A, CBC, CMP, lipase, GC/chlamydia, and wet prep. Found to be hyperglycemic however no signs concerning for DKA. Wet prep without findings and negative for gonorrhea and chlamydia. Pt reports the pain this time around has been worse. She did not follow up with anyone after that initial ED visit.   The history is provided by the patient and medical records.       Past Medical History:  Diagnosis Date  . Diabetes mellitus without complication (HCC)    Diagnosed in 2009; on insulin  . Heartburn in pregnancy   . History of trichomoniasis   . Infection    UTI  . Migraine    otc med prn    Patient Active Problem List   Diagnosis Date Noted  . S/P repeat low transverse C-section 08/06/2013  . Type 2 diabetes mellitus (HCC) 08/06/2013  . Migraines  12/23/2012  . History of trichomoniasis 12/23/2012  . HSV (herpes simplex virus) infection 12/23/2012  . IDDM 09/07/2010    Past Surgical History:  Procedure Laterality Date  . CESAREAN SECTION  2009  . CESAREAN SECTION N/A 08/04/2013   Procedure: REPEAT CESAREAN SECTION;  Surgeon: Oliver Pila, MD;  Location: [facility];  Service: Obstetrics;  Laterality: N/A;  . DENTAL SURGERY     wisdom teeth ext     OB History    Gravida  2   Para  2   Term  2   Preterm      AB      Living  2     SAB      TAB      Ectopic      Multiple      Live Births  2           Family History  Problem Relation Age of Onset  . Diabetes Mother   . Hypertension Mother   . Diabetes Father   . Birth defects Sister        heart was on wrong side  . Cancer Maternal Grandmother   . Alzheimer's disease Maternal Grandfather   . Cancer Paternal Grandmother     Social History   Tobacco Use  . Smoking status: Light Tobacco Smoker    Packs/day: 0.50    Years: 6.00    Pack years: 3.00    Types: Cigarettes  Last attempt to quit: 10/22/2012    Years since quitting: 7.3  . Smokeless tobacco: Never Used  Substance Use Topics  . Alcohol use: Yes    Comment: Occasional  . Drug use: No    Home Medications Prior to Admission medications   Medication Sig Start Date End Date Taking? Authorizing Provider  fluconazole (DIFLUCAN) 150 MG tablet Take 1 tablet on Day 1. Take additional tablet on Day 4 if symptoms persist. 02/15/20   Hyman Hopes, Cory Rama, PA-C  insulin glargine (LANTUS) 100 UNIT/ML injection Inject 0.2 mLs (20 Units total) 2 (two) times daily into the skin. Patient taking differently: Inject 70 Units into the skin 2 (two) times daily.  09/01/17   Devoria Albe, MD  insulin lispro (HUMALOG) 100 UNIT/ML injection Inject 0.1 mLs (10 Units total) 3 (three) times daily before meals into the skin. Patient taking differently: Inject 15 Units into the skin 3 (three) times daily before meals.   09/01/17   Devoria Albe, MD  metroNIDAZOLE (FLAGYL) 500 MG tablet Take 1 tablet (500 mg total) by mouth 2 (two) times daily. 02/15/20   Tanda Rockers, PA-C    Allergies    Patient has no known allergies.  Review of Systems   Review of Systems  Constitutional: Negative for chills and fever.  Gastrointestinal: Positive for abdominal pain. Negative for diarrhea, nausea and vomiting.  Genitourinary: Positive for pelvic pain. Negative for difficulty urinating, flank pain, frequency and vaginal discharge.  All other systems reviewed and are negative.   Physical Exam Updated Vital Signs BP 125/82 (BP Location: Right Arm)   Pulse 100   Resp 16   Ht 5\' 4"  (1.626 m)   Wt 70.3 kg   LMP 01/30/2020   SpO2 100%   BMI 26.61 kg/m   Physical Exam Vitals and nursing note reviewed.  Constitutional:      Appearance: She is not ill-appearing or diaphoretic.  HENT:     Head: Normocephalic and atraumatic.  Eyes:     Conjunctiva/sclera: Conjunctivae normal.  Cardiovascular:     Rate and Rhythm: Normal rate and regular rhythm.     Heart sounds: Normal heart sounds.  Pulmonary:     Effort: Pulmonary effort is normal.     Breath sounds: Normal breath sounds. No wheezing, rhonchi or rales.  Abdominal:     Palpations: Abdomen is soft.     Tenderness: There is abdominal tenderness in the suprapubic area and left lower quadrant. There is no right CVA tenderness, left CVA tenderness, guarding or rebound.  Genitourinary:    Comments: Chaperone present for exam 03/31/2020, RN No rashes, lesions, or tenderness to external genitalia. No erythema, injury, or tenderness to vaginal mucosa. Thin white/yellow vaginal discharge in vault. + left adnexal TTP. No CMT, cervical friability, or discharge from cervical os. Cervical os is closed. Uterus non-deviated, mobile, nonTTP, and without enlargement.  Musculoskeletal:     Cervical back: Neck supple.  Skin:    General: Skin is warm and dry.    Neurological:     Mental Status: She is alert.     ED Results / Procedures / Treatments   Labs (all labs ordered are listed, but only abnormal results are displayed) Labs Reviewed  WET PREP, GENITAL - Abnormal; Notable for the following components:      Result Value   Clue Cells Wet Prep HPF POC PRESENT (*)    WBC, Wet Prep HPF POC RARE (*)    All other components within normal limits  URINALYSIS, ROUTINE W REFLEX MICROSCOPIC - Abnormal; Notable for the following components:   APPearance CLOUDY (*)    Specific Gravity, Urine 1.038 (*)    Glucose, UA >=500 (*)    Hgb urine dipstick SMALL (*)    Ketones, ur 20 (*)    Leukocytes,Ua MODERATE (*)    Bacteria, UA FEW (*)    All other components within normal limits  COMPREHENSIVE METABOLIC PANEL - Abnormal; Notable for the following components:   Sodium 132 (*)    Chloride 97 (*)    Glucose, Bld 461 (*)    All other components within normal limits  CBG MONITORING, ED - Abnormal; Notable for the following components:   Glucose-Capillary 417 (*)    All other components within normal limits  CBG MONITORING, ED - Abnormal; Notable for the following components:   Glucose-Capillary 268 (*)    All other components within normal limits  LIPASE, BLOOD  CBC WITH DIFFERENTIAL/PLATELET  HCG, SERUM, QUALITATIVE  RPR  HIV ANTIBODY (ROUTINE TESTING W REFLEX)  GC/CHLAMYDIA PROBE AMP () NOT AT [facility]    EKG None  Radiology US PELVIC COMPLETE W TRANSVAGINAL AND TORSION R/O  Result Date: 02/15/2020 CLINICAL DATA:  Pelvic pain EXAM: TRANSABDOMINAL AND TRANSVAGINAL ULTRASOUND OF PELVIS DOPPLER ULTRASOUND OF OVARIES TECHNIQUE: Both transabdominal and transvaginal ultrasound examinations of the pelvis were performed. Transabdominal technique was performed for global imaging of the pelvis including uterus, ovaries, adnexal regions, and pelvic cul-de-sac. It was necessary to proceed with endovaginal exam following the transabdominal exam  to visualize the ovaries. Color and duplex Doppler ultrasound was utilized to evaluate blood flow to the ovaries. COMPARISON:  01/17/2011 FINDINGS: Uterus Measurements: 7.3 x 4.8 x 5.3 cm = volume: 97 mL. No fibroids or other mass visualized. Endometrium Thickness: 4 mm in thickness.  No focal abnormality visualized. Right ovary Measurements: 3.1 x 1.9 x 3.1 cm = volume: 9.5 mL. Normal appearance/no adnexal mass. Left ovary Measurements: 4.4 x 2.7 x 2.6 cm = volume: 15.9 mL. Normal appearance/no adnexal mass. Pulsed Doppler evaluation of both ovaries demonstrates normal low-resistance arterial and venous waveforms. Other findings No abnormal free fluid. IMPRESSION: Normal study. Electronically Signed   By: Charlett Nose M.D.   On: 02/15/2020 12:02    Procedures Procedures (including critical care time)  Medications Ordered in ED Medications  insulin aspart (novoLOG) injection 10 Units (10 Units Subcutaneous Given 02/15/20 1207)  sodium chloride 0.9 % bolus 1,000 mL (1,000 mLs Intravenous New Bag/Given 02/15/20 1207)    ED Course  I have reviewed the triage vital signs and the nursing notes.  Pertinent labs & imaging results that were available during my care of the patient were reviewed by me and considered in my medical decision making (see chart for details).  Clinical Course as of Feb 14 1337  Mon Feb 15, 2020  0950 Glucose-Capillary(!): 417 [MV]  1149 Clue Cells Wet Prep HPF POC(!): PRESENT [MV]  1330 Glucose-Capillary(!): 268 [MV]    Clinical Course User Index [MV] Tanda Rockers, PA-C   MDM Rules/Calculators/A&P                      34 year old female presents the ED today complaining of lower abdominal/pelvic pain intermittently for the past 2 months.  This will occur even if she is not on her menses.  Last normal menstrual period 04/10.  On arrival to the ED patient is afebrile and nontachypneic.  Noted to be tachycardic in the low 100s.  Of note CBG was obtained with glucose 417.   Patient does not appear to be in DKA but will await CMP at this time. On exam pt has TTP in the suprapubic/LLQ/left inguinal area. Will need to perform pelvic exam given complaint of pelvic pain. Pt without nausea, vomiting, diarrhea. Suspect her symptoms are likely more pelvic in nature. Doubt acute abdomen. Will swab for STIs at this time.  Patient requesting HIV and syphilis testing as well.   CBC without leukocytosis. Hgb stable.  CMP with sodium 132. Glucose 461. Bicarb 23. No gap. Pt receiving fluids and 10 units insulin.  Lipase 21 U/A with moderate leuks, 11-20 WBCs however 21-60 squamous cells. Suspect contamination. Does show budding yeast.  hch negative.   Pelvic exam performed; pt is noted to have left adnexal TTP. Therefore will obtain pelvic ultrasound.   Pelvic ultrasound without acute findings Wet prep positive for clue cells; will treat for BV as pt's discharge does appear consistent. This may be causing pt's intermittent pain  After fluids and 10 units insulin CBG rechecked at 268. Feel pt is stable for discharge home at this time. Will cover for BV. Pt does report she typically gets yeast infections with abx; will discharge with diflucan. [facility] center info given for follow up. Pt advised to refrain from intercourse for 10 days pending other results. She is advised to continue taking insulin as indicated due to hyperglycemia today there is concern for noncompliance. Strict return precautions discussed. Pt is in agreement with plan at this time and stable for discharge home.   This note was prepared using Dragon voice recognition software and may include unintentional dictation errors due to the inherent limitations of voice recognition software.  Final Clinical Impression(s) / ED Diagnoses Final diagnoses:  Pelvic pain in female  Bacterial vaginosis  Hyperglycemia    Rx / DC Orders ED Discharge Orders         Ordered    metroNIDAZOLE (FLAGYL) 500 MG tablet  2 times daily      02/15/20 1334    fluconazole (DIFLUCAN) 150 MG tablet     02/15/20 1334           Discharge Instructions     Please see attached information regarding bacterial vaginosis which you tested positive for today. This may be what is causing your pelvic pain. Pick up antibiotics and take as prescribed. DO NOT DRINK ALCOHOL WHILE ON THIS MEDICATION.   I have prescribed diflucan to cover for a yeast infection too given you typically get yeast infections with antibiotics.   [facility] with [facility] for recheck of symptoms  We have tested you for gonorrhea, chlamydia, HIV, and syphilis and we will call you if you test positive. Please refrain from intercourse for the next 10 days waiting on results. If you test positive you may return to the ED or health department to seek treatment. Please let all partners know that they will need to be treated as well.   Continue taking your insulin as prescribed. Your blood sugars were elevated today.   Return to the ED for any worsening symptoms including worsening pain, fevers > 100.4, excessive vomiting.        Eustaquio Maize, PA-C 02/15/20 1348    Fredia Sorrow, MD 02/20/20 1218

## 2020-02-15 NOTE — ED Notes (Signed)
Pt to US.

## 2020-02-15 NOTE — ED Triage Notes (Signed)
Pt presents to ED with complaints of lower abdominal/pelvic pain intermittently x last couple of months. Pt denies pain with intercourse.

## 2020-02-15 NOTE — Discharge Instructions (Signed)
Please see attached information regarding bacterial vaginosis which you tested positive for today. This may be what is causing your pelvic pain. Pick up antibiotics and take as prescribed. DO NOT DRINK ALCOHOL WHILE ON THIS MEDICATION.   I have prescribed diflucan to cover for a yeast infection too given you typically get yeast infections with antibiotics.   Follow up with [facility] for recheck of symptoms  We have tested you for gonorrhea, chlamydia, HIV, and syphilis and we will call you if you test positive. Please refrain from intercourse for the next 10 days waiting on results. If you test positive you may return to the ED or health department to seek treatment. Please let all partners know that they will need to be treated as well.   Continue taking your insulin as prescribed. Your blood sugars were elevated today.   Return to the ED for any worsening symptoms including worsening pain, fevers > 100.4, excessive vomiting.

## 2020-02-16 LAB — RPR: RPR Ser Ql: NONREACTIVE

## 2020-02-16 LAB — GC/CHLAMYDIA PROBE AMP (~~LOC~~) NOT AT ARMC
Chlamydia: NEGATIVE
Comment: NEGATIVE
Comment: NORMAL
Neisseria Gonorrhea: NEGATIVE

## 2020-10-02 ENCOUNTER — Emergency Department (HOSPITAL_COMMUNITY)
Admission: EM | Admit: 2020-10-02 | Discharge: 2020-10-02 | Disposition: A | Discharge status: Home / Self Care | Payer: Self-pay | Attending: Emergency Medicine | Admitting: Emergency Medicine

## 2020-10-02 ENCOUNTER — Emergency Department (HOSPITAL_COMMUNITY): Payer: Self-pay

## 2020-10-02 ENCOUNTER — Encounter (HOSPITAL_COMMUNITY): Payer: Self-pay | Admitting: *Deleted

## 2020-10-02 ENCOUNTER — Other Ambulatory Visit: Payer: Self-pay

## 2020-10-02 DIAGNOSIS — F17210 Nicotine dependence, cigarettes, uncomplicated: Secondary | ICD-10-CM | POA: Insufficient documentation

## 2020-10-02 DIAGNOSIS — E119 Type 2 diabetes mellitus without complications: Secondary | ICD-10-CM | POA: Insufficient documentation

## 2020-10-02 DIAGNOSIS — Y9269 Other specified industrial and construction area as the place of occurrence of the external cause: Secondary | ICD-10-CM | POA: Insufficient documentation

## 2020-10-02 DIAGNOSIS — Z794 Long term (current) use of insulin: Secondary | ICD-10-CM | POA: Insufficient documentation

## 2020-10-02 DIAGNOSIS — Y9389 Activity, other specified: Secondary | ICD-10-CM | POA: Insufficient documentation

## 2020-10-02 DIAGNOSIS — S4991XA Unspecified injury of right shoulder and upper arm, initial encounter: Secondary | ICD-10-CM | POA: Insufficient documentation

## 2020-10-02 DIAGNOSIS — S022XXA Fracture of nasal bones, initial encounter for closed fracture: Secondary | ICD-10-CM | POA: Insufficient documentation

## 2020-10-02 DIAGNOSIS — S0990XA Unspecified injury of head, initial encounter: Secondary | ICD-10-CM | POA: Insufficient documentation

## 2020-10-02 LAB — I-STAT BETA HCG BLOOD, ED (MC, WL, AP ONLY): I-stat hCG, quantitative: <5 m[IU]/mL (ref <5)

## 2020-10-02 MED ORDER — ACETAMINOPHEN 500 MG PO TABS
1000.0000 mg | ORAL_TABLET | Freq: Once | ORAL | Status: AC
  Administered 2020-10-02: 1000 mg via ORAL
  Filled 2020-10-02: qty 2

## 2020-10-02 MED ORDER — SODIUM CHLORIDE 0.9 % IV BOLUS
1000.0000 mL | Freq: Once | INTRAVENOUS | Status: AC
  Administered 2020-10-02: 1000 mL via INTRAVENOUS
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
## 2020-10-02 NOTE — ED Notes (Signed)
Patient transported to X-ray 
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
## 2020-10-02 NOTE — Discharge Instructions (Signed)
Use Tylenol or ibuprofen as needed for pain. Use ice to help with pain and swelling. You may have continued dizziness of the neck several days, you may have a mild concussion.  Try to avoid things that make her head hurt worse or cause the dizziness to come on. Follow-up with ear nose and throat doctor as needed for further evaluation of your nose. Return to the emergency room if you develop severe worsening headache, double vision, vision loss, or any new, worsening, or concerning symptoms.

## 2020-11-15 NOTE — Progress Notes (Deleted)
Office Visit Note  Patient: Evelyn [name]             Date of Birth: May 29, 1986           MRN: [id number]             PCP: [facility] [place] Referring: Cyndee Brightly, Lynelle Smoke, * Visit Date: 11/29/2020 Occupation: '@GUAROCC' @  Subjective:  No chief complaint on file.   History of Present Illness: Evelyn [name] is a 35 y.o. female ***   Activities of Daily Living:  Patient reports morning stiffness for *** {minute/hour:19697}.   Patient {ACTIONS;DENIES/REPORTS:21021675::"Denies"} nocturnal pain.  Difficulty dressing/grooming: {ACTIONS;DENIES/REPORTS:21021675::"Denies"} Difficulty climbing stairs: {ACTIONS;DENIES/REPORTS:21021675::"Denies"} Difficulty getting out of chair: {ACTIONS;DENIES/REPORTS:21021675::"Denies"} Difficulty using hands for taps, buttons, cutlery, and/or writing: {ACTIONS;DENIES/REPORTS:21021675::"Denies"}  No Rheumatology ROS completed.   PMFS History:  Patient Active Problem List   Diagnosis Date Noted  . S/P repeat low transverse C-section 08/06/2013  . Type 2 diabetes mellitus ([place]) 08/06/2013  . Migraines 12/23/2012  . History of trichomoniasis 12/23/2012  . HSV (herpes simplex virus) infection 12/23/2012  . Type 1 diabetes mellitus ([place]) 09/07/2010    Past Medical History:  Diagnosis Date  . Diabetes mellitus without complication ([place])    Diagnosed in 2009; on insulin  . Heartburn in pregnancy   . History of trichomoniasis   . Infection    UTI  . Migraine    otc med prn    Family History  Problem Relation Age of Onset  . Diabetes Mother   . Hypertension Mother   . Diabetes Father   . Birth defects Sister        heart was on wrong side  . Cancer Maternal Grandmother   . Alzheimer's disease Maternal Grandfather   . Cancer Paternal Grandmother    Past Surgical History:  Procedure Laterality Date  . CESAREAN SECTION  2009  . CESAREAN SECTION N/A 08/04/2013   Procedure: REPEAT CESAREAN SECTION;  Surgeon: Logan Bores, MD;  Location: [facility];  Service: Obstetrics;  Laterality: N/A;  . DENTAL SURGERY     wisdom teeth ext   Social History   Social History Narrative  . Not on file   Immunization History  Administered Date(s) Administered  . Pneumococcal Polysaccharide-23 08/06/2013     Objective: Vital Signs: There were no vitals taken for this visit.   Physical Exam   Musculoskeletal Exam: ***  CDAI Exam: CDAI Score: -- Patient Global: --; Provider Global: -- Swollen: --; Tender: -- Joint Exam 11/29/2020   No joint exam has been documented for this visit   There is currently no information documented on the homunculus. Go to the Rheumatology activity and complete the homunculus joint exam.  Investigation: No additional findings.  Imaging: No results found.  Recent Labs: Lab Results  Component Value Date   WBC 4.9 02/15/2020   HGB 14.1 02/15/2020   PLT 337 02/15/2020   NA 132 (L) 02/15/2020   K 4.0 02/15/2020   CL 97 (L) 02/15/2020   CO2 23 02/15/2020   GLUCOSE 461 (H) 02/15/2020   BUN 13 02/15/2020   CREATININE 0.49 02/15/2020   BILITOT 0.5 02/15/2020   ALKPHOS 66 02/15/2020   AST 19 02/15/2020   ALT 18 02/15/2020   PROT 7.2 02/15/2020   ALBUMIN 4.0 02/15/2020   CALCIUM 9.1 02/15/2020   GFRAA >60 02/15/2020    Speciality Comments: No specialty comments available.  Procedures:  No procedures performed Allergies: Patient has no known allergies.   Assessment /  Plan:     Visit Diagnoses: Positive ANA (antinuclear antibody) - 04/23/19: ANA 1:80 NS, 1:40 NH, uric acid 3.1, ESR 6, RF<14  Vitamin D deficiency  History of hyperlipidemia  Chronic pain of left knee  History of diabetes mellitus  Hx of migraines  HSV (herpes simplex virus) infection  S/P repeat low transverse C-section  Orders: No orders of the defined types were placed in this encounter.  No orders of the defined types were placed in this encounter.   Face-to-face time spent with  patient was *** minutes. Greater than 50% of time was spent in counseling and coordination of care.  Follow-Up Instructions: No follow-ups on file.   Ofilia Neas, PA-C  Note - This record has been created using Dragon software.  Chart creation errors have been sought, but may not always  have been located. Such creation errors do not reflect on  the standard of medical care.,

## 2020-11-29 ENCOUNTER — Ambulatory Visit: Payer: Medicaid Other | Admitting: Rheumatology

## 2020-11-29 DIAGNOSIS — Z8669 Personal history of other diseases of the nervous system and sense organs: Secondary | ICD-10-CM

## 2020-11-29 DIAGNOSIS — G8929 Other chronic pain: Secondary | ICD-10-CM

## 2020-11-29 DIAGNOSIS — Z98891 History of uterine scar from previous surgery: Secondary | ICD-10-CM

## 2020-11-29 DIAGNOSIS — R768 Other specified abnormal immunological findings in serum: Secondary | ICD-10-CM

## 2020-11-29 DIAGNOSIS — E559 Vitamin D deficiency, unspecified: Secondary | ICD-10-CM

## 2020-11-29 DIAGNOSIS — B009 Herpesviral infection, unspecified: Secondary | ICD-10-CM

## 2020-11-29 DIAGNOSIS — Z8639 Personal history of other endocrine, nutritional and metabolic disease: Secondary | ICD-10-CM
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
## 2021-04-21 ENCOUNTER — Emergency Department (HOSPITAL_COMMUNITY)
Admission: EM | Admit: 2021-04-21 | Discharge: 2021-04-21 | Disposition: A | Discharge status: Home / Self Care | Payer: Medicaid Other | Attending: Emergency Medicine | Admitting: Emergency Medicine

## 2021-04-21 ENCOUNTER — Other Ambulatory Visit: Payer: Self-pay

## 2021-04-21 DIAGNOSIS — K0889 Other specified disorders of teeth and supporting structures: Secondary | ICD-10-CM

## 2021-04-21 DIAGNOSIS — F17210 Nicotine dependence, cigarettes, uncomplicated: Secondary | ICD-10-CM | POA: Insufficient documentation

## 2021-04-21 DIAGNOSIS — E119 Type 2 diabetes mellitus without complications: Secondary | ICD-10-CM | POA: Insufficient documentation

## 2021-04-21 MED ORDER — OXYCODONE HCL 5 MG PO TABS
5.0000 mg | ORAL_TABLET | Freq: Once | ORAL | Status: AC
Start: 2021-04-21 — End: 2021-04-21
  Administered 2021-04-21: 02:00:00 5 mg via ORAL
  Filled 2021-04-21: qty 1

## 2021-04-21 MED ORDER — OXYCODONE HCL 5 MG PO TABS: 5.0000 mg | ORAL_TABLET | ORAL | 0 refills | Status: DC | PRN

## 2021-04-21 MED ORDER — AMOXICILLIN-POT CLAVULANATE 875-125 MG PO TABS
1.0000 | ORAL_TABLET | Freq: Once | ORAL | Status: AC
  Administered 2021-04-21: 02:00:00 1 via ORAL
  Filled 2021-04-21: qty 1

## 2021-04-21 MED ORDER — AMOXICILLIN-POT CLAVULANATE 875-125 MG PO TABS: 1.0000 | ORAL_TABLET | Freq: Two times a day (BID) | ORAL | 0 refills | Status: AC

## 2021-04-21 NOTE — ED Provider Notes (Signed)
[facility]-EMERGENCY DEPT [facility] Emergency Department Provider Note MRN:  [id number]  Arrival date & time: 04/21/21     Chief Complaint   Dental Pain   History of Present Illness   Evelyn [name] is a 35 y.o. year-old female with a history of diabetes presenting to the ED with chief complaint of dental pain.  Patient had a tooth extracted yesterday and is here with worsening pain.  Explains that the tooth was infected and she has been taking clindamycin for the past 3 days.  Pain is currently 10 out of 10, constant, worse with any motion or palpation.  It is located in the lower premolar area.  Denies fever.  Review of Systems  A problem-focused ROS was performed. Positive for tooth pain.  Patient denies fever.  Patient's Health History    Past Medical History:  Diagnosis Date   Diabetes mellitus without complication ([facility])    Diagnosed in 2009; on insulin   Heartburn in pregnancy    History of trichomoniasis    Infection    UTI   Migraine    otc med prn    Past Surgical History:  Procedure Laterality Date   CESAREAN SECTION  2009   CESAREAN SECTION N/A 08/04/2013   Procedure: REPEAT CESAREAN SECTION;  Surgeon: Oliver Pila, MD;  Location: [facility];  Service: Obstetrics;  Laterality: N/A;   DENTAL SURGERY     wisdom teeth ext    Family History  Problem Relation Age of Onset   Diabetes Mother    Hypertension Mother    Diabetes Father    Birth defects Sister        heart was on wrong side   Cancer Maternal Grandmother    Alzheimer's disease Maternal Grandfather    Cancer Paternal Grandmother     Social History   Socioeconomic History   Marital status: Single    Spouse name: Not on file   Number of children: Not on file   Years of education: Not on file   Highest education level: Not on file  Occupational History   Not on file  Tobacco Use   Smoking status: Light Smoker    Packs/day: 0.50    Years: 6.00    Pack years: 3.00    Types: Cigarettes     Last attempt to quit: 10/22/2012    Years since quitting: 8.5   Smokeless tobacco: Never  Substance and Sexual Activity   Alcohol use: Yes    Comment: Occasional   Drug use: No   Sexual activity: Yes  Other Topics Concern   Not on file  Social History Narrative   Not on file   Social Determinants of Health   Financial Resource Strain: Not on file  Food Insecurity: Not on file  Transportation Needs: Not on file  Physical Activity: Not on file  Stress: Not on file  Social Connections: Not on file  Intimate Partner Violence: Not on file     Physical Exam  There were no vitals filed for this visit.  CONSTITUTIONAL: Well-appearing, NAD NEURO:  Alert and oriented x 3, no focal deficits EYES:  eyes equal and reactive ENT/NECK:  no LAD, no JVD; removed left mandibular premolar with no exposed socket, clot in place.  Tender to palpation, palpable left mandibular lymph node externally CARDIO: Regular rate, well-perfused, normal S1 and S2 PULM:  CTAB no wheezing or rhonchi GI/GU:  normal bowel sounds, non-distended, non-tender MSK/SPINE:  No gross deformities, no edema SKIN:  no  rash, atraumatic PSYCH:  Appropriate speech and behavior  *Additional and/or pertinent findings included in MDM below  Diagnostic and Interventional Summary    EKG Interpretation  Date/Time:    Ventricular Rate:    PR Interval:    QRS Duration:   QT Interval:    QTC Calculation:   R Axis:     Text Interpretation:          Labs Reviewed - No data to display  No orders to display    Medications  oxyCODONE (Oxy IR/ROXICODONE) immediate release tablet 5 mg (has no administration in time range)  amoxicillin-clavulanate (AUGMENTIN) 875-125 MG per tablet 1 tablet (has no administration in time range)     Procedures  /  Critical Care Procedures  ED Course and Medical Decision Making  I have reviewed the triage vital signs, the nursing notes, and pertinent available records from the  EMR.  Listed above are laboratory and imaging tests that I personally ordered, reviewed, and interpreted and then considered in my medical decision making (see below for details).  Could be continued soreness from the extraction, also considering continued infection not being cleared by the clindamycin, will switch antibiotics, provide pain control, advise dentistry follow-up.  Otherwise normal vital signs, no fever, nothing to suggest more significant contiguous infection.       Elmer Sow. Pilar Plate, MD [facility] Emergency Medicine [facility] [email or web address]  Final Clinical Impressions(s) / ED Diagnoses     ICD-10-CM   1. Pain, dental  K08.89       ED Discharge Orders          Ordered    amoxicillin-clavulanate (AUGMENTIN) 875-125 MG tablet  Every 12 hours        04/21/21 0144    oxyCODONE (ROXICODONE) 5 MG immediate release tablet  Every 4 hours PRN        04/21/21 0144             Discharge Instructions Discussed with and Provided to Patient:    Discharge Instructions      You were evaluated in the Emergency Department and after careful evaluation, we did not find any emergent condition requiring admission or further testing in the hospital.  Your exam/testing today is overall reassuring.  Pain may be due to some residual inflammation or infection.  Please stop taking the clindamycin and start taking the Augmentin.  Continue taking Tylenol or Motrin for pain.  Can use the oxycodone for more significant pain.  Recommend close follow-up with your dentist.  Please return to the Emergency Department if you experience any worsening of your condition.   Thank you for allowing [place] to be a part of your care.       Sabas Sous, MD 04/21/21 [phone]

## 2021-04-21 NOTE — ED Triage Notes (Signed)
Pt c/o dental pain for the past week. Pt prescribed abx this past Tuesday, and tooth pulled yesterday. Pt c/o continued pain, states they didn't prescribe her anything for pain.

## 2021-04-21 NOTE — Discharge Instructions (Addendum)
You were evaluated in the Emergency Department and after careful evaluation, we did not find any emergent condition requiring admission or further testing in the hospital.  Your exam/testing today is overall reassuring.  Pain may be due to some residual inflammation or infection.  Please stop taking the clindamycin and start taking the Augmentin.  Continue taking Tylenol or Motrin for pain.  Can use the oxycodone for more significant pain.  Recommend close follow-up with your dentist.  Please return to the Emergency Department if you experience any worsening of your condition.   Thank you for allowing [place] to be a part of your care.

## 2021-10-20 ENCOUNTER — Encounter (HOSPITAL_COMMUNITY): Payer: Self-pay

## 2021-10-20 ENCOUNTER — Other Ambulatory Visit: Payer: Self-pay

## 2021-10-20 ENCOUNTER — Emergency Department (HOSPITAL_COMMUNITY)
Admission: EM | Admit: 2021-10-20 | Discharge: 2021-10-20 | Disposition: A | Discharge status: Home / Self Care | Payer: Medicaid Other | Attending: Emergency Medicine | Admitting: Emergency Medicine

## 2021-10-20 DIAGNOSIS — F17210 Nicotine dependence, cigarettes, uncomplicated: Secondary | ICD-10-CM | POA: Insufficient documentation

## 2021-10-20 DIAGNOSIS — R000 Tachycardia, unspecified: Secondary | ICD-10-CM | POA: Insufficient documentation

## 2021-10-20 DIAGNOSIS — Z79899 Other long term (current) drug therapy: Secondary | ICD-10-CM | POA: Insufficient documentation

## 2021-10-20 DIAGNOSIS — R112 Nausea with vomiting, unspecified: Secondary | ICD-10-CM | POA: Insufficient documentation

## 2021-10-20 DIAGNOSIS — R739 Hyperglycemia, unspecified: Secondary | ICD-10-CM

## 2021-10-20 DIAGNOSIS — Z794 Long term (current) use of insulin: Secondary | ICD-10-CM | POA: Insufficient documentation

## 2021-10-20 DIAGNOSIS — R109 Unspecified abdominal pain: Secondary | ICD-10-CM | POA: Insufficient documentation

## 2021-10-20 DIAGNOSIS — E1165 Type 2 diabetes mellitus with hyperglycemia: Secondary | ICD-10-CM | POA: Insufficient documentation

## 2021-10-20 LAB — COMPREHENSIVE METABOLIC PANEL
ALT: 15 U/L (ref 0–44)
AST: 15 U/L (ref 15–41)
Albumin: 4.5 g/dL (ref 3.5–5.0)
Alkaline Phosphatase: 59 U/L (ref 38–126)
Anion gap: 13 (ref 5–15)
BUN: 13 mg/dL (ref 6–20)
CO2: 25 mmol/L (ref 22–32)
Calcium: 9.6 mg/dL (ref 8.9–10.3)
Chloride: 99 mmol/L (ref 98–111)
Creatinine, Ser: 0.34 mg/dL — ABNORMAL LOW (ref 0.44–1.00)
GFR, Estimated: >60 mL/min (ref >60)
Glucose, Bld: 324 mg/dL — ABNORMAL HIGH (ref 70–99)
Potassium: 3.5 mmol/L (ref 3.5–5.1)
Sodium: 137 mmol/L (ref 135–145)
Total Bilirubin: 1.7 mg/dL — ABNORMAL HIGH (ref 0.3–1.2)
Total Protein: 7.9 g/dL (ref 6.5–8.1)

## 2021-10-20 LAB — CBC WITH DIFFERENTIAL/PLATELET
Abs Immature Granulocytes: 0.05 10*3/uL (ref 0.00–0.07)
Basophils Absolute: 0 10*3/uL (ref 0.0–0.1)
Basophils Relative: 0 %
Eosinophils Absolute: 0 10*3/uL (ref 0.0–0.5)
Eosinophils Relative: 0 %
HCT: 43.6 % (ref 36.0–46.0)
Hemoglobin: 14.5 g/dL (ref 12.0–15.0)
Immature Granulocytes: 1 %
Lymphocytes Relative: 18 %
Lymphs Abs: 2 10*3/uL (ref 0.7–4.0)
MCH: 28.8 pg (ref 26.0–34.0)
MCHC: 33.3 g/dL (ref 30.0–36.0)
MCV: 86.7 fL (ref 80.0–100.0)
Monocytes Absolute: 0.5 10*3/uL (ref 0.1–1.0)
Monocytes Relative: 5 %
Neutro Abs: 8.4 10*3/uL — ABNORMAL HIGH (ref 1.7–7.7)
Neutrophils Relative %: 76 %
Platelets: 404 10*3/uL — ABNORMAL HIGH (ref 150–400)
RBC: 5.03 MIL/uL (ref 3.87–5.11)
RDW: 12.9 % (ref 11.5–15.5)
WBC: 11 10*3/uL — ABNORMAL HIGH (ref 4.0–10.5)
nRBC: 0 % (ref 0.0–0.2)

## 2021-10-20 LAB — BLOOD GAS, VENOUS
Acid-Base Excess: 2.5 mmol/L — ABNORMAL HIGH (ref 0.0–2.0)
Bicarbonate: 26 mmol/L (ref 20.0–28.0)
Drawn by: 5212
FIO2: 21
O2 Saturation: 79.9 %
Patient temperature: 37.1
pCO2, Ven: 43.3 mmHg — ABNORMAL LOW (ref 44.0–60.0)
pH, Ven: 7.408 (ref 7.250–7.430)
pO2, Ven: 45.9 mmHg — ABNORMAL HIGH (ref 32.0–45.0)

## 2021-10-20 LAB — BETA-HYDROXYBUTYRIC ACID: Beta-Hydroxybutyric Acid: 1.26 mmol/L — ABNORMAL HIGH (ref 0.05–0.27)

## 2021-10-20 LAB — POC URINE PREG, ED: Preg Test, Ur: NEGATIVE

## 2021-10-20 LAB — ETHANOL: Alcohol, Ethyl (B): <10 mg/dL (ref <10)

## 2021-10-20 LAB — CBG MONITORING, ED: Glucose-Capillary: 300 mg/dL — ABNORMAL HIGH (ref 70–99)

## 2021-10-20 MED ORDER — SODIUM CHLORIDE 0.9 % IV BOLUS
2000.0000 mL | Freq: Once | INTRAVENOUS | Status: AC
  Administered 2021-10-20: 21:00:00 2000 mL via INTRAVENOUS

## 2021-10-20 MED ORDER — ONDANSETRON HCL 4 MG/2ML IJ SOLN
4.0000 mg | INTRAMUSCULAR | Status: AC
  Administered 2021-10-20: 21:00:00 4 mg via INTRAVENOUS
  Filled 2021-10-20: qty 2

## 2021-10-20 MED ORDER — ONDANSETRON HCL 4 MG PO TABS: 4.0000 mg | ORAL_TABLET | Freq: Four times a day (QID) | ORAL | 0 refills | Status: DC

## 2021-10-20 MED ORDER — POTASSIUM CHLORIDE CRYS ER 20 MEQ PO TBCR
40.0000 meq | EXTENDED_RELEASE_TABLET | Freq: Once | ORAL | Status: AC
Start: 2021-10-20 — End: 2021-10-20
  Administered 2021-10-20: 40 meq via ORAL
  Filled 2021-10-20: qty 2

## 2021-10-20 MED ORDER — ACETAMINOPHEN 325 MG PO TABS
650.0000 mg | ORAL_TABLET | Freq: Once | ORAL | Status: AC
  Administered 2021-10-20: 22:00:00 650 mg via ORAL
  Filled 2021-10-20: qty 2

## 2021-10-20 MED ORDER — GLIPIZIDE 5 MG PO TABS: 5.0000 mg | ORAL_TABLET | Freq: Two times a day (BID) | ORAL | 2 refills | Status: DC

## 2021-10-20 NOTE — Discharge Instructions (Addendum)
Use goodrx.com to help with cost of medicines.  Drink plenty of clear liquids  Zofran every 6 hours as needed for nausea  Start taking glipizide 1 tablet by mouth twice daily to help with your diabetes  You will need to see your doctor within 48 hours for recheck

## 2021-10-20 NOTE — ED Triage Notes (Signed)
Pt from home with reports of vomiting, high blood sugar, and  chest pain. Pt says she is out of insulin and hasn't taken insulin in over two months.

## 2021-10-20 NOTE — ED Provider Notes (Signed)
[facility] EMERGENCY DEPARTMENT Provider Note   CSN: [id number] Arrival date & time: 10/20/21  1906     History Chief Complaint  Patient presents with   Vomiting    Chest pain and high blood sugar    Evelyn [name] is a 35 y.o. female.  HPI  This patient is a 35 year old female with a known history of diabetes, she is supposed to be taking insulin, she was diagnosed approximately 14 years ago.  She is out of her medication for the last several months and after drinking a lot of liquor last night she presented after developing nausea and vomiting this morning.  She has been vomiting rather persistently throughout the day, she has no abdominal pain at this time but does have abdominal pain when she vomits.  No diarrhea, no fevers or chills, no coughing or shortness of breath, no swelling of the legs.  Nobody else around her has been sick, no medications prior to arrival.  She is currently followed up with the health department in the county to try to work on getting insulin as an outpatient but has not yet happened  Past Medical History:  Diagnosis Date   Diabetes mellitus without complication ([place])    Diagnosed in 2009; on insulin   Heartburn in pregnancy    History of trichomoniasis    Infection    UTI   Migraine    otc med prn    Patient Active Problem List   Diagnosis Date Noted   S/P repeat low transverse C-section 08/06/2013   Type 2 diabetes mellitus ([place]) 08/06/2013   Migraines 12/23/2012   History of trichomoniasis 12/23/2012   HSV (herpes simplex virus) infection 12/23/2012   Type 1 diabetes mellitus ([place]) 09/07/2010    Past Surgical History:  Procedure Laterality Date   CESAREAN SECTION  2009   CESAREAN SECTION N/A 08/04/2013   Procedure: REPEAT CESAREAN SECTION;  Surgeon: Logan Bores, MD;  Location: [facility];  Service: Obstetrics;  Laterality: N/A;   DENTAL SURGERY     wisdom teeth ext     OB History     Gravida  2   Para  2   Term  2    Preterm      AB      Living  2      SAB      IAB      Ectopic      Multiple      Live Births  2           Family History  Problem Relation Age of Onset   Diabetes Mother    Hypertension Mother    Diabetes Father    Birth defects Sister        heart was on wrong side   Cancer Maternal Grandmother    Alzheimer's disease Maternal Grandfather    Cancer Paternal Grandmother     Social History   Tobacco Use   Smoking status: Light Smoker    Packs/day: 0.50    Years: 6.00    Pack years: 3.00    Types: Cigarettes    Last attempt to quit: 10/22/2012    Years since quitting: 9.0   Smokeless tobacco: Never  Vaping Use   Vaping Use: Never used  Substance Use Topics   Alcohol use: Yes    Comment: Occasional   Drug use: No    Home Medications Prior to Admission medications   Medication Sig Start Date End Date Taking? Authorizing  Provider  glipiZIDE (GLUCOTROL) 5 MG tablet Take 1 tablet (5 mg total) by mouth 2 (two) times daily before a meal. 10/20/21 11/19/21 Yes Noemi Chapel, MD  ondansetron (ZOFRAN) 4 MG tablet Take 1 tablet (4 mg total) by mouth every 6 (six) hours. 10/20/21  Yes Noemi Chapel, MD  fluconazole (DIFLUCAN) 150 MG tablet Take 1 tablet on Day 1. Take additional tablet on Day 4 if symptoms persist. 02/15/20   Alroy Bailiff, Margaux, PA-C  insulin glargine (LANTUS) 100 UNIT/ML injection Inject 0.2 mLs (20 Units total) 2 (two) times daily into the skin. Patient taking differently: Inject 70 Units into the skin 2 (two) times daily.  09/01/17   Rolland Porter, MD  insulin lispro (HUMALOG) 100 UNIT/ML injection Inject 0.1 mLs (10 Units total) 3 (three) times daily before meals into the skin. Patient taking differently: Inject 15 Units into the skin 3 (three) times daily before meals.  09/01/17   Rolland Porter, MD  metroNIDAZOLE (FLAGYL) 500 MG tablet Take 1 tablet (500 mg total) by mouth 2 (two) times daily. 02/15/20   Alroy Bailiff, Margaux, PA-C  oxyCODONE (ROXICODONE) 5 MG  immediate release tablet Take 1 tablet (5 mg total) by mouth every 4 (four) hours as needed for severe pain. 04/21/21   Maudie Flakes, MD    Allergies    Morphine and related  Review of Systems   Review of Systems  All other systems reviewed and are negative.  Physical Exam Updated Vital Signs BP 106/76    Pulse 98    Temp 98.8 F (37.1 C) (Oral)    Resp 16    Ht 1.626 m (5\' 4" )    Wt 68 kg    LMP 10/11/2021 (Exact Date)    SpO2 97%    BMI 25.75 kg/m   Physical Exam Vitals and nursing note reviewed.  Constitutional:      General: She is not in acute distress.    Appearance: She is well-developed.  HENT:     Head: Normocephalic and atraumatic.     Mouth/Throat:     Mouth: Mucous membranes are dry.     Pharynx: No oropharyngeal exudate.  Eyes:     General: No scleral icterus.       Right eye: No discharge.        Left eye: No discharge.     Conjunctiva/sclera: Conjunctivae normal.     Pupils: Pupils are equal, round, and reactive to light.  Neck:     Thyroid: No thyromegaly.     Vascular: No JVD.  Cardiovascular:     Rate and Rhythm: Regular rhythm. Tachycardia present.     Heart sounds: Normal heart sounds. No murmur heard.   No friction rub. No gallop.  Pulmonary:     Effort: Pulmonary effort is normal. No respiratory distress.     Breath sounds: Normal breath sounds. No wheezing or rales.  Abdominal:     General: Bowel sounds are normal. There is no distension.     Palpations: Abdomen is soft. There is no mass.     Tenderness: There is no abdominal tenderness.  Musculoskeletal:        General: No tenderness. Normal range of motion.     Cervical back: Normal range of motion and neck supple.  Lymphadenopathy:     Cervical: No cervical adenopathy.  Skin:    General: Skin is warm and dry.     Findings: No erythema or rash.  Neurological:     General: No focal deficit  present.     Mental Status: She is alert.     Coordination: Coordination normal.  Psychiatric:         Behavior: Behavior normal.    ED Results / Procedures / Treatments   Labs (all labs ordered are listed, but only abnormal results are displayed) Labs Reviewed  COMPREHENSIVE METABOLIC PANEL - Abnormal; Notable for the following components:      Result Value   Glucose, Bld 324 (*)    Creatinine, Ser 0.34 (*)    Total Bilirubin 1.7 (*)    All other components within normal limits  CBC WITH DIFFERENTIAL/PLATELET - Abnormal; Notable for the following components:   WBC 11.0 (*)    Platelets 404 (*)    Neutro Abs 8.4 (*)    All other components within normal limits  BETA-HYDROXYBUTYRIC ACID - Abnormal; Notable for the following components:   Beta-Hydroxybutyric Acid 1.26 (*)    All other components within normal limits  BLOOD GAS, VENOUS - Abnormal; Notable for the following components:   pCO2, Ven 43.3 (*)    pO2, Ven 45.9 (*)    Acid-Base Excess 2.5 (*)    All other components within normal limits  CBG MONITORING, ED - Abnormal; Notable for the following components:   Glucose-Capillary 300 (*)    All other components within normal limits  ETHANOL  RAPID URINE DRUG SCREEN, HOSP PERFORMED  BETA-HYDROXYBUTYRIC ACID  URINALYSIS, ROUTINE W REFLEX MICROSCOPIC  POC URINE PREG, ED    EKG EKG Interpretation  Date/Time:  Friday October 20 2021 20:45:49 EST Ventricular Rate:  105 PR Interval:  128 QRS Duration: 80 QT Interval:  348 QTC Calculation: 459 R Axis:   84 Text Interpretation: Sinus tachycardia Otherwise normal ECG When compared with ECG of 02-Oct-2020 19:17, PREVIOUS ECG IS PRESENT Confirmed by Noemi Chapel [phone]) on 10/20/2021 9:34:36 PM  Radiology No results found.  Procedures Procedures   Medications Ordered in ED Medications  potassium chloride SA (KLOR-CON M) CR tablet 40 mEq (has no administration in time range)  sodium chloride 0.9 % bolus 2,000 mL (2,000 mLs Intravenous New Bag/Given 10/20/21 2120)  ondansetron (ZOFRAN) injection 4 mg (4 mg  Intravenous Given 10/20/21 2114)  acetaminophen (TYLENOL) tablet 650 mg (650 mg Oral Given 10/20/21 2138)    ED Course  I have reviewed the triage vital signs and the nursing notes.  Pertinent labs & imaging results that were available during my care of the patient were reviewed by me and considered in my medical decision making (see chart for details).    MDM Rules/Calculators/A&P                          This patient presents to the ED for concern of nausea and vomiting, this involves an extensive number of treatment options, and is a complaint that carries with it a high risk of complications and morbidity.  The differential diagnosis includes diabetic ketoacidosis, alcoholic ketosis, gastritis, viral gastroenteritis, colitis, gallbladder disease   Co morbidities that complicate the patient evaluation  Poorly treated diabetes type 1   Additional history obtained:  Additional history obtained from family member at the bedside as well as the medical record External records from outside source obtained and reviewed including multiple treatments in the emergency department as well as admission to the hospital for diabetic complications.  I see office visits regarding her chronic therapy as well.   Lab Tests:  I Ordered, and personally interpreted labs.  The pertinent results include: Hyperglycemia with a blood sugar around 300, her ABG shows no signs of acidosis whatsoever, comprehensive metabolic panel shows no increased anion gap acidosis, normal potassium and sodium, normal CO2, white blood cell count of 11,000 which is nonspecific, there was a slightly elevated beta hydroxybutyric acid.   Cardiac Monitoring:  The patient was maintained on a cardiac monitor.  I personally viewed and interpreted the cardiac monitored which showed an underlying rhythm of: The patient started having sinus tachycardia on arrival but this completely resolved with IV fluids.  Cardiac monitoring shows  normal sinus rhythm at the time of discharge   Medicines ordered and prescription drug management:  I ordered medication including IV fluids for dehydration and hyperglycemia as well as potassium and Zofran to help with hypokalemia and nausea Reevaluation of the patient after these medicines showed that the patient improved I have reviewed the patients home medicines and have made adjustments as needed   Test Considered:  Consider doing other tests such as ABG but VBG was sufficient, CT scan was not necessary as there is no abdominal tenderness   Critical Interventions:  IV fluids Checking metabolic panel to rule out DKA Replacing potassium Antiemetics   Problem List / ED Course:  The patient symptoms completely resolved after 2 L of IV fluids.  Her vital signs normalized and she was no longer tachycardic.  She was given oral potassium, she will be given prescriptions for home including glipizide as she did not tolerate metformin in the past.  I highly doubt that she has an insulin only diabetic given that she has been without medicines for months and her sugar was only 300.  She will follow-up with the health department   Reevaluation:  After the interventions noted above, I reevaluated the patient and found that they have :improved   Social Determinants of Health:  The patient has poor access to care and medications, she is working with the health department on getting medications through a special program   Dispostion:  After consideration of the diagnostic results and the patients response to treatment, I feel that the patent would benefit from discharge home.       Final Clinical Impression(s) / ED Diagnoses Final diagnoses:  Hyperglycemia  Nausea and vomiting, unspecified vomiting type    Rx / DC Orders ED Discharge Orders          Ordered    glipiZIDE (GLUCOTROL) 5 MG tablet  2 times daily before meals        10/20/21 2319    ondansetron (ZOFRAN) 4 MG  tablet  Every 6 hours        10/20/21 2320             Eber Hong, MD 10/20/21 2325

## 2021-10-21 LAB — URINALYSIS, ROUTINE W REFLEX MICROSCOPIC
Bilirubin Urine: NEGATIVE
Glucose, UA: >=500 mg/dL — AB
Ketones, ur: 80 mg/dL — AB
Leukocytes,Ua: NEGATIVE
Nitrite: NEGATIVE
Protein, ur: 30 mg/dL — AB
Specific Gravity, Urine: 1.038 — ABNORMAL HIGH (ref 1.005–1.030)
pH: 6 (ref 5.0–8.0)

## 2021-10-21 LAB — RAPID URINE DRUG SCREEN, HOSP PERFORMED
Amphetamines: NOT DETECTED
Barbiturates: NOT DETECTED
Benzodiazepines: NOT DETECTED
Cocaine: NOT DETECTED
Opiates: NOT DETECTED
Tetrahydrocannabinol: POSITIVE — AB

## 2021-11-19 ENCOUNTER — Observation Stay (HOSPITAL_COMMUNITY)
Admission: EM | Admit: 2021-11-19 | Discharge: 2021-11-20 | Disposition: A | Discharge status: Home / Self Care | Payer: Medicaid Other | Attending: Internal Medicine | Admitting: Internal Medicine

## 2021-11-19 ENCOUNTER — Encounter (HOSPITAL_COMMUNITY): Payer: Self-pay | Admitting: Emergency Medicine

## 2021-11-19 ENCOUNTER — Other Ambulatory Visit: Payer: Self-pay

## 2021-11-19 DIAGNOSIS — Z20822 Contact with and (suspected) exposure to covid-19: Secondary | ICD-10-CM | POA: Insufficient documentation

## 2021-11-19 DIAGNOSIS — Z7984 Long term (current) use of oral hypoglycemic drugs: Secondary | ICD-10-CM | POA: Insufficient documentation

## 2021-11-19 DIAGNOSIS — E663 Overweight: Secondary | ICD-10-CM | POA: Diagnosis present

## 2021-11-19 DIAGNOSIS — G43909 Migraine, unspecified, not intractable, without status migrainosus: Secondary | ICD-10-CM | POA: Diagnosis present

## 2021-11-19 DIAGNOSIS — Z794 Long term (current) use of insulin: Secondary | ICD-10-CM | POA: Insufficient documentation

## 2021-11-19 DIAGNOSIS — E1110 Type 2 diabetes mellitus with ketoacidosis without coma: Principal | ICD-10-CM | POA: Insufficient documentation

## 2021-11-19 DIAGNOSIS — Z720 Tobacco use: Secondary | ICD-10-CM | POA: Diagnosis present

## 2021-11-19 DIAGNOSIS — E113299 Type 2 diabetes mellitus with mild nonproliferative diabetic retinopathy without macular edema, unspecified eye: Secondary | ICD-10-CM | POA: Diagnosis present

## 2021-11-19 DIAGNOSIS — R112 Nausea with vomiting, unspecified: Secondary | ICD-10-CM

## 2021-11-19 DIAGNOSIS — E119 Type 2 diabetes mellitus without complications: Secondary | ICD-10-CM

## 2021-11-19 DIAGNOSIS — D72829 Elevated white blood cell count, unspecified: Secondary | ICD-10-CM | POA: Insufficient documentation

## 2021-11-19 DIAGNOSIS — E869 Volume depletion, unspecified: Secondary | ICD-10-CM | POA: Insufficient documentation

## 2021-11-19 DIAGNOSIS — F17210 Nicotine dependence, cigarettes, uncomplicated: Secondary | ICD-10-CM | POA: Insufficient documentation

## 2021-11-19 LAB — I-STAT CHEM 8, ED
BUN: 21 mg/dL — ABNORMAL HIGH (ref 6–20)
Calcium, Ion: 1.13 mmol/L — ABNORMAL LOW (ref 1.15–1.40)
Chloride: 107 mmol/L (ref 98–111)
Creatinine, Ser: 0.3 mg/dL — ABNORMAL LOW (ref 0.44–1.00)
Glucose, Bld: 405 mg/dL — ABNORMAL HIGH (ref 70–99)
HCT: 46 % (ref 36.0–46.0)
Hemoglobin: 15.6 g/dL — ABNORMAL HIGH (ref 12.0–15.0)
Potassium: 5 mmol/L (ref 3.5–5.1)
Sodium: 139 mmol/L (ref 135–145)
TCO2: 21 mmol/L — ABNORMAL LOW (ref 22–32)

## 2021-11-19 LAB — BASIC METABOLIC PANEL
Anion gap: 11 (ref 5–15)
BUN: 15 mg/dL (ref 6–20)
CO2: 17 mmol/L — ABNORMAL LOW (ref 22–32)
Calcium: 8.2 mg/dL — ABNORMAL LOW (ref 8.9–10.3)
Chloride: 107 mmol/L (ref 98–111)
Creatinine, Ser: 0.32 mg/dL — ABNORMAL LOW (ref 0.44–1.00)
GFR, Estimated: >60 mL/min (ref >60)
Glucose, Bld: 279 mg/dL — ABNORMAL HIGH (ref 70–99)
Potassium: 4.4 mmol/L (ref 3.5–5.1)
Sodium: 135 mmol/L (ref 135–145)

## 2021-11-19 LAB — BASIC METABOLIC PANEL WITH GFR
Anion gap: 8 (ref 5–15)
Anion gap: 4 — ABNORMAL LOW (ref 5–15)
Anion gap: 6 (ref 5–15)
BUN: 14 mg/dL (ref 6–20)
BUN: 12 mg/dL (ref 6–20)
BUN: 11 mg/dL (ref 6–20)
CO2: 21 mmol/L — ABNORMAL LOW (ref 22–32)
CO2: 22 mmol/L (ref 22–32)
CO2: 23 mmol/L (ref 22–32)
Calcium: 7.9 mg/dL — ABNORMAL LOW (ref 8.9–10.3)
Calcium: 7.9 mg/dL — ABNORMAL LOW (ref 8.9–10.3)
Calcium: 8 mg/dL — ABNORMAL LOW (ref 8.9–10.3)
Chloride: 108 mmol/L (ref 98–111)
Chloride: 110 mmol/L (ref 98–111)
Chloride: 106 mmol/L (ref 98–111)
Creatinine, Ser: <0.3 mg/dL — ABNORMAL LOW (ref 0.44–1.00)
Creatinine, Ser: 0.3 mg/dL — ABNORMAL LOW (ref 0.44–1.00)
Creatinine, Ser: 0.34 mg/dL — ABNORMAL LOW (ref 0.44–1.00)
GFR, Estimated: >60 mL/min
GFR, Estimated: >60 mL/min
Glucose, Bld: 226 mg/dL — ABNORMAL HIGH (ref 70–99)
Glucose, Bld: 169 mg/dL — ABNORMAL HIGH (ref 70–99)
Glucose, Bld: 180 mg/dL — ABNORMAL HIGH (ref 70–99)
Potassium: 3.5 mmol/L (ref 3.5–5.1)
Potassium: 3.5 mmol/L (ref 3.5–5.1)
Potassium: 3.6 mmol/L (ref 3.5–5.1)
Sodium: 137 mmol/L (ref 135–145)
Sodium: 136 mmol/L (ref 135–145)
Sodium: 135 mmol/L (ref 135–145)

## 2021-11-19 LAB — COMPREHENSIVE METABOLIC PANEL
ALT: 20 U/L (ref 0–44)
AST: 21 U/L (ref 15–41)
Albumin: 4.7 g/dL (ref 3.5–5.0)
Alkaline Phosphatase: 67 U/L (ref 38–126)
Anion gap: 16 — ABNORMAL HIGH (ref 5–15)
BUN: 18 mg/dL (ref 6–20)
CO2: 18 mmol/L — ABNORMAL LOW (ref 22–32)
Calcium: 9.6 mg/dL (ref 8.9–10.3)
Chloride: 103 mmol/L (ref 98–111)
Creatinine, Ser: 0.54 mg/dL (ref 0.44–1.00)
GFR, Estimated: >60 mL/min (ref >60)
Glucose, Bld: 400 mg/dL — ABNORMAL HIGH (ref 70–99)
Potassium: 4.4 mmol/L (ref 3.5–5.1)
Sodium: 137 mmol/L (ref 135–145)
Total Bilirubin: 1.2 mg/dL (ref 0.3–1.2)
Total Protein: 8.6 g/dL — ABNORMAL HIGH (ref 6.5–8.1)

## 2021-11-19 LAB — BLOOD GAS, VENOUS
Acid-base deficit: 8.5 mmol/L — ABNORMAL HIGH (ref 0.0–2.0)
Bicarbonate: 18.8 mmol/L — ABNORMAL LOW (ref 20.0–28.0)
O2 Saturation: 60.3 %
Patient temperature: 98.6
pCO2, Ven: 46.9 mmHg (ref 44.0–60.0)
pH, Ven: 7.226 — ABNORMAL LOW (ref 7.250–7.430)
pO2, Ven: 38.4 mmHg (ref 32.0–45.0)

## 2021-11-19 LAB — CBC WITH DIFFERENTIAL/PLATELET
Abs Immature Granulocytes: 0.07 10*3/uL (ref 0.00–0.07)
Basophils Absolute: 0 10*3/uL (ref 0.0–0.1)
Basophils Relative: 0 %
Eosinophils Absolute: 0 10*3/uL (ref 0.0–0.5)
Eosinophils Relative: 0 %
HCT: 43.7 % (ref 36.0–46.0)
Hemoglobin: 14.5 g/dL (ref 12.0–15.0)
Immature Granulocytes: 0 %
Lymphocytes Relative: 5 %
Lymphs Abs: 0.9 10*3/uL (ref 0.7–4.0)
MCH: 29.1 pg (ref 26.0–34.0)
MCHC: 33.2 g/dL (ref 30.0–36.0)
MCV: 87.8 fL (ref 80.0–100.0)
Monocytes Absolute: 0.2 10*3/uL (ref 0.1–1.0)
Monocytes Relative: 1 %
Neutro Abs: 17.2 10*3/uL — ABNORMAL HIGH (ref 1.7–7.7)
Neutrophils Relative %: 94 %
Platelets: 467 10*3/uL — ABNORMAL HIGH (ref 150–400)
RBC: 4.98 MIL/uL (ref 3.87–5.11)
RDW: 12.5 % (ref 11.5–15.5)
WBC: 18.4 10*3/uL — ABNORMAL HIGH (ref 4.0–10.5)
nRBC: 0 % (ref 0.0–0.2)

## 2021-11-19 LAB — URINALYSIS, ROUTINE W REFLEX MICROSCOPIC
Bacteria, UA: NONE SEEN
Bilirubin Urine: NEGATIVE
Glucose, UA: >=500 mg/dL — AB
Hgb urine dipstick: NEGATIVE
Ketones, ur: 80 mg/dL — AB
Leukocytes,Ua: NEGATIVE
Nitrite: NEGATIVE
Protein, ur: 30 mg/dL — AB
Specific Gravity, Urine: 1.028 (ref 1.005–1.030)
pH: 5 (ref 5.0–8.0)

## 2021-11-19 LAB — RESP PANEL BY RT-PCR (FLU A&B, COVID) ARPGX2
Influenza A by PCR: NEGATIVE
Influenza B by PCR: NEGATIVE
SARS Coronavirus 2 by RT PCR: NEGATIVE

## 2021-11-19 LAB — CBG MONITORING, ED
Glucose-Capillary: 377 mg/dL — ABNORMAL HIGH (ref 70–99)
Glucose-Capillary: 282 mg/dL — ABNORMAL HIGH (ref 70–99)
Glucose-Capillary: 230 mg/dL — ABNORMAL HIGH (ref 70–99)
Glucose-Capillary: 240 mg/dL — ABNORMAL HIGH (ref 70–99)
Glucose-Capillary: 205 mg/dL — ABNORMAL HIGH (ref 70–99)
Glucose-Capillary: 178 mg/dL — ABNORMAL HIGH (ref 70–99)
Glucose-Capillary: 182 mg/dL — ABNORMAL HIGH (ref 70–99)
Glucose-Capillary: 170 mg/dL — ABNORMAL HIGH (ref 70–99)
Glucose-Capillary: 147 mg/dL — ABNORMAL HIGH (ref 70–99)

## 2021-11-19 LAB — I-STAT BETA HCG BLOOD, ED (MC, WL, AP ONLY): I-stat hCG, quantitative: <5 m[IU]/mL (ref <5)

## 2021-11-19 LAB — BETA-HYDROXYBUTYRIC ACID
Beta-Hydroxybutyric Acid: 0.55 mmol/L — ABNORMAL HIGH (ref 0.05–0.27)
Beta-Hydroxybutyric Acid: 0.64 mmol/L — ABNORMAL HIGH (ref 0.05–0.27)

## 2021-11-19 LAB — MAGNESIUM: Magnesium: 2.1 mg/dL (ref 1.7–2.4)

## 2021-11-19 LAB — LIPASE, BLOOD: Lipase: 23 U/L (ref 11–51)

## 2021-11-19 LAB — PHOSPHORUS: Phosphorus: 2.7 mg/dL (ref 2.5–4.6)

## 2021-11-19 LAB — GLUCOSE, CAPILLARY
Glucose-Capillary: 142 mg/dL — ABNORMAL HIGH (ref 70–99)
Glucose-Capillary: 267 mg/dL — ABNORMAL HIGH (ref 70–99)

## 2021-11-19 LAB — HEMOGLOBIN A1C
Hgb A1c MFr Bld: 11.9 % — ABNORMAL HIGH (ref 4.8–5.6)
Mean Plasma Glucose: 294.83 mg/dL

## 2021-11-19 MED ORDER — INSULIN REGULAR(HUMAN) IN NACL 100-0.9 UT/100ML-% IV SOLN
INTRAVENOUS | Status: DC
  Administered 2021-11-19: 3.8 [IU]/h via INTRAVENOUS
  Filled 2021-11-19: qty 100

## 2021-11-19 MED ORDER — ONDANSETRON HCL 4 MG/2ML IJ SOLN
4.0000 mg | Freq: Four times a day (QID) | INTRAMUSCULAR | Status: DC | PRN
  Filled 2021-11-19: qty 2

## 2021-11-19 MED ORDER — POTASSIUM CHLORIDE 10 MEQ/100ML IV SOLN
10.0000 meq | INTRAVENOUS | Status: AC
  Administered 2021-11-19: 10 meq via INTRAVENOUS

## 2021-11-19 MED ORDER — KETOROLAC TROMETHAMINE 15 MG/ML IJ SOLN
15.0000 mg | Freq: Once | INTRAMUSCULAR | Status: AC
Start: 2021-11-19 — End: 2021-11-19
  Administered 2021-11-19: 15 mg via INTRAVENOUS
  Filled 2021-11-19: qty 1

## 2021-11-19 MED ORDER — METOCLOPRAMIDE HCL 5 MG/ML IJ SOLN
10.0000 mg | Freq: Four times a day (QID) | INTRAMUSCULAR | Status: AC
  Administered 2021-11-19 (×3): 10 mg via INTRAVENOUS
  Filled 2021-11-19 (×3): qty 2

## 2021-11-19 MED ORDER — INSULIN ASPART 100 UNIT/ML IJ SOLN
6.0000 [IU] | Freq: Once | INTRAMUSCULAR | Status: AC
  Administered 2021-11-19: 6 [IU] via SUBCUTANEOUS
  Filled 2021-11-19: qty 0.06

## 2021-11-19 MED ORDER — SODIUM CHLORIDE 0.9 % IV BOLUS
1000.0000 mL | Freq: Once | INTRAVENOUS | Status: AC
  Administered 2021-11-19: 1000 mL via INTRAVENOUS

## 2021-11-19 MED ORDER — DEXTROSE 50 % IV SOLN: 0.0000 mL | INTRAVENOUS | Status: DC | PRN

## 2021-11-19 MED ORDER — ONDANSETRON HCL 4 MG PO TABS: 4.0000 mg | ORAL_TABLET | Freq: Four times a day (QID) | ORAL | Status: DC | PRN

## 2021-11-19 MED ORDER — POTASSIUM CHLORIDE 10 MEQ/100ML IV SOLN
10.0000 meq | INTRAVENOUS | Status: AC
  Administered 2021-11-19 (×3): 10 meq via INTRAVENOUS
  Filled 2021-11-19 (×3): qty 100

## 2021-11-19 MED ORDER — MAGNESIUM SULFATE 2 GM/50ML IV SOLN
2.0000 g | Freq: Once | INTRAVENOUS | Status: AC
  Administered 2021-11-19: 2 g via INTRAVENOUS
  Filled 2021-11-19: qty 50

## 2021-11-19 MED ORDER — LACTATED RINGERS IV SOLN: INTRAVENOUS | Status: DC

## 2021-11-19 MED ORDER — INSULIN ASPART 100 UNIT/ML IJ SOLN: 0.0000 [IU] | Freq: Three times a day (TID) | INTRAMUSCULAR | Status: DC

## 2021-11-19 MED ORDER — METOCLOPRAMIDE HCL 5 MG/ML IJ SOLN
10.0000 mg | Freq: Once | INTRAMUSCULAR | Status: AC
  Administered 2021-11-19: 10 mg via INTRAVENOUS
  Filled 2021-11-19: qty 2

## 2021-11-19 MED ORDER — PANTOPRAZOLE SODIUM 40 MG IV SOLR
40.0000 mg | INTRAVENOUS | Status: DC
  Administered 2021-11-19 – 2021-11-20 (×2): 40 mg via INTRAVENOUS
  Filled 2021-11-19 (×2): qty 40

## 2021-11-19 MED ORDER — ACETAMINOPHEN 650 MG RE SUPP: 650.0000 mg | Freq: Four times a day (QID) | RECTAL | Status: DC | PRN

## 2021-11-19 MED ORDER — DEXTROSE IN LACTATED RINGERS 5 % IV SOLN: INTRAVENOUS | Status: DC

## 2021-11-19 MED ORDER — SODIUM CHLORIDE 0.9 % IV BOLUS
2000.0000 mL | Freq: Once | INTRAVENOUS | Status: AC
  Administered 2021-11-19: 2000 mL via INTRAVENOUS

## 2021-11-19 MED ORDER — KETOROLAC TROMETHAMINE 30 MG/ML IJ SOLN
30.0000 mg | Freq: Once | INTRAMUSCULAR | Status: AC
  Administered 2021-11-19: 30 mg via INTRAVENOUS
  Filled 2021-11-19: qty 1

## 2021-11-19 MED ORDER — INSULIN GLARGINE-YFGN 100 UNIT/ML ~~LOC~~ SOLN
20.0000 [IU] | Freq: Two times a day (BID) | SUBCUTANEOUS | Status: DC
  Administered 2021-11-19 – 2021-11-20 (×3): 20 [IU] via SUBCUTANEOUS
  Filled 2021-11-19 (×4): qty 0.2

## 2021-11-19 MED ORDER — INSULIN ASPART 100 UNIT/ML IJ SOLN
0.0000 [IU] | Freq: Three times a day (TID) | INTRAMUSCULAR | Status: DC
  Administered 2021-11-19: 2 [IU] via SUBCUTANEOUS
  Administered 2021-11-20: 3 [IU] via SUBCUTANEOUS
  Administered 2021-11-20: 2 [IU] via SUBCUTANEOUS

## 2021-11-19 MED ORDER — ACETAMINOPHEN 325 MG PO TABS
650.0000 mg | ORAL_TABLET | Freq: Four times a day (QID) | ORAL | Status: DC | PRN
  Administered 2021-11-19: 650 mg via ORAL
  Filled 2021-11-19: qty 2

## 2021-11-19 MED ORDER — LACTATED RINGERS IV BOLUS: 2000.0000 mL | Freq: Once | INTRAVENOUS | Status: DC

## 2021-11-19 MED ORDER — LACTATED RINGERS IV BOLUS
1000.0000 mL | Freq: Once | INTRAVENOUS | Status: AC
  Administered 2021-11-19: 1000 mL via INTRAVENOUS

## 2021-11-19 MED ORDER — DIPHENHYDRAMINE HCL 50 MG/ML IJ SOLN
12.5000 mg | Freq: Once | INTRAMUSCULAR | Status: AC
  Administered 2021-11-19: 12.5 mg via INTRAVENOUS
  Filled 2021-11-19: qty 1

## 2021-11-19 MED ORDER — INSULIN REGULAR(HUMAN) IN NACL 100-0.9 UT/100ML-% IV SOLN: INTRAVENOUS | Status: DC

## 2021-11-19 MED ORDER — LACTATED RINGERS IV BOLUS
1000.0000 mL | Freq: Once | INTRAVENOUS | Status: AC
Start: 2021-11-19 — End: 2021-11-19
  Administered 2021-11-19: 1000 mL via INTRAVENOUS

## 2021-11-19 MED ORDER — LACTATED RINGERS IV BOLUS: 1000.0000 mL | Freq: Once | INTRAVENOUS | Status: DC

## 2021-11-19 MED ORDER — ONDANSETRON HCL 4 MG/2ML IJ SOLN
4.0000 mg | Freq: Once | INTRAMUSCULAR | Status: AC | PRN
  Administered 2021-11-19: 4 mg via INTRAVENOUS
  Filled 2021-11-19: qty 2

## 2021-11-19 MED ORDER — POTASSIUM CHLORIDE 10 MEQ/100ML IV SOLN
10.0000 meq | INTRAVENOUS | Status: AC
  Administered 2021-11-19: 10 meq via INTRAVENOUS
  Filled 2021-11-19 (×2): qty 100

## 2021-11-19 MED ORDER — POTASSIUM CHLORIDE 10 MEQ/100ML IV SOLN: 10.0000 meq | INTRAVENOUS | Status: DC

## 2021-11-19 NOTE — H&P (Signed)
History and Physical    Evelyn [name] [id number] DOB: 1986/10/16 DOA: 11/19/2021  PCP: [facility] (  Patient coming from: Home.  I have personally briefly reviewed patient's old medical records in [place]  Chief Complaint: Abdominal pain, nausea and vomiting.  HPI: Evelyn [name] is a 36 y.o. female with medical history significant of type II DM, heartburn in pregnancy, trichomoniasis, history of UTI, migraine headaches who is coming to the emergency department due to abdominal pain with about 30-40 episodes of emesis since early yesterday evening in the setting of hyperglycemia.  She has not taken her insulin, glipizide or any other medications in the past 2 months.  She has been having polyuria, polydipsia and occasionally blurry vision.  She denied fever, chills, sore throat, dyspnea, wheezing or hemoptysis.  No chest pain, palpitations, diaphoresis, PND, orthopnea or pitting edema of the lower extremities.  She had 2 episodes of diarrhea yesterday, but not constipated melena or hematochezia.  No flank pain, dysuria, frequency or hematuria.  ED Course: Initial vital signs were temperature 97.8 F, pulse 107, respirations 16, BP 141/87 mmHg O2 sat 95% on room air.  The patient was given 3000 mL of NS bolus, metoclopramide 10 mg IVP x1, diphenhydramine 12.5 mg IVP x1, ketorolac 15 mg IVP x1, NovoLog 6 units SQ x1 and ondansetron 4 mg IVP.  Subsequently, she was started on an insulin infusion.  I added 2000 mL of LR bolus and magnesium sulfate 2 g IV.  Lab work: CBC showed a white count of 18.4, hemoglobin 14.5 g/dL platelets 467.  CMP with a CO2 of 18 mmol/L with anion gap of 16, glucose of 400 mg/dL and total protein 8.6 g/dL. Venous blood gas showed a pH of 7.226, bicarbonate of 18.9 and acid-base deficit of 8.5 mmol/L.  Review of Systems: As per HPI otherwise all other systems reviewed and are negative.  Past Medical History:  Diagnosis Date   Diabetes  mellitus without complication ([place])    Diagnosed in 2009; on insulin   Heartburn in pregnancy    History of trichomoniasis    Infection    UTI   Migraine    otc med prn   Past Surgical History:  Procedure Laterality Date   CESAREAN SECTION  2009   CESAREAN SECTION N/A 08/04/2013   Procedure: REPEAT CESAREAN SECTION;  Surgeon: Logan Bores, MD;  Location: [facility];  Service: Obstetrics;  Laterality: N/A;   DENTAL SURGERY     wisdom teeth ext    Social History  reports that she has been smoking cigarettes. She has a 3.00 pack-year smoking history. She has never used smokeless tobacco. She reports current alcohol use. She reports that she does not use drugs.  Allergies  Allergen Reactions   Morphine And Related Itching    Family History  Problem Relation Age of Onset   Diabetes Mother    Hypertension Mother    Diabetes Father    Birth defects Sister        heart was on wrong side   Cancer Maternal Grandmother    Alzheimer's disease Maternal Grandfather    Cancer Paternal Grandmother    Medication Sig Start Date End Date Taking? Authorizing Provider  glipiZIDE (GLUCOTROL) 5 MG tablet Take 1 tablet (5 mg total) by mouth 2 (two) times daily before a meal. 10/20/21 11/19/21 Yes Noemi Chapel, MD  insulin glargine (LANTUS) 100 UNIT/ML injection Inject 0.2 mLs (20 Units total) 2 (two) times daily into  the skin. Patient taking differently: Inject 70 Units into the skin 2 (two) times daily. 09/01/17  Yes Devoria Albe, MD  insulin lispro (HUMALOG) 100 UNIT/ML injection Inject 0.1 mLs (10 Units total) 3 (three) times daily before meals into the skin. Patient taking differently: Inject 15 Units into the skin 3 (three) times daily before meals. 09/01/17  Yes Devoria Albe, MD  fluconazole (DIFLUCAN) 150 MG tablet Take 1 tablet on Day 1. Take additional tablet on Day 4 if symptoms persist. Patient not taking: Reported on 11/19/2021 02/15/20   Tanda Rockers, PA-C  metroNIDAZOLE (FLAGYL) 500  MG tablet Take 1 tablet (500 mg total) by mouth 2 (two) times daily. Patient not taking: Reported on 11/19/2021 02/15/20   Tanda Rockers, PA-C  ondansetron (ZOFRAN) 4 MG tablet Take 1 tablet (4 mg total) by mouth every 6 (six) hours. Patient not taking: Reported on 11/19/2021 10/20/21   Eber Hong, MD  oxyCODONE (ROXICODONE) 5 MG immediate release tablet Take 1 tablet (5 mg total) by mouth every 4 (four) hours as needed for severe pain. Patient not taking: Reported on 11/19/2021 04/21/21   Sabas Sous, MD   Physical Exam: Vitals:   11/19/21 0515 11/19/21 0530 11/19/21 0630 11/19/21 0725  BP: 109/87 103/67 94/61 103/65  Pulse: (!) 117 (!) 116 (!) 117 (!) 119  Resp: 19 (!) 23 13 12   Temp:      TempSrc:      SpO2: 100% 98% 98% 98%   Constitutional: NAD, calm, comfortable Eyes: PERRL, lids and conjunctivae normal ENMT: Mucous membranes are mildly dry.  Lips are dry.  Posterior pharynx clear of any exudate or lesions. Neck: normal, supple, no masses, no thyromegaly Respiratory: Clear to auscultation bilaterally, no wheezing, no crackles. Normal respiratory effort. No accessory muscle use.  Cardiovascular: Regular rate and rhythm, no murmurs / rubs / gallops. No extremity edema. 2+ pedal pulses. No carotid bruits.  Abdomen: No distention.  Soft, no tenderness, no masses palpated. No hepatosplenomegaly. Bowel sounds positive.  Musculoskeletal: no clubbing / cyanosis. Good ROM, no contractures. Normal muscle tone.  Skin: no rashes, lesions, ulcers on very limited dermatological semination. Neurologic: CN 2-12 grossly intact. Sensation intact, DTR normal. Strength 5/5 in all 4.  Psychiatric: Normal judgment and insight. Alert and oriented x 3. Normal mood.   Labs on Admission: I have personally reviewed following labs and imaging studies  CBC: Recent Labs  Lab 11/19/21 0221 11/19/21 0230  WBC 18.4*  --   NEUTROABS 17.2*  --   HGB 14.5 15.6*  HCT 43.7 46.0  MCV 87.8  --   PLT 467*   --     Basic Metabolic Panel: Recent Labs  Lab 11/19/21 0221 11/19/21 0230 11/19/21 0610  NA 137 139 135  K 4.4 5.0 4.4  CL 103 107 107  CO2 18*  --  17*  GLUCOSE 400* 405* 279*  BUN 18 21* 15  CREATININE 0.54 0.30* 0.32*  CALCIUM 9.6  --  8.2*  MG 2.1  --   --     GFR: CrCl cannot be calculated (Unknown ideal weight.).  Liver Function Tests: Recent Labs  Lab 11/19/21 0221  AST 21  ALT 20  ALKPHOS 67  BILITOT 1.2  PROT 8.6*  ALBUMIN 4.7    Urine analysis:    Component Value Date/Time   COLORURINE STRAW (A) 11/19/2021 0214   APPEARANCEUR HAZY (A) 11/19/2021 0214   LABSPEC 1.028 11/19/2021 0214   PHURINE 5.0 11/19/2021 0214   GLUCOSEU >=500 (  A) 11/19/2021 0214   HGBUR NEGATIVE 11/19/2021 0214   BILIRUBINUR NEGATIVE 11/19/2021 0214   KETONESUR 80 (A) 11/19/2021 0214   PROTEINUR 30 (A) 11/19/2021 0214        NITRITE NEGATIVE 11/19/2021 0214   LEUKOCYTESUR NEGATIVE 11/19/2021 0214    Radiological Exams on Admission: No results found.  EKG: Independently reviewed.  Vent. rate 104 BPM PR interval 148 ms QRS duration 100 ms QT/QTcB 371/488 ms P-R-T axes 8 -6 52 Sinus tachycardia Consider right atrial enlargement Borderline prolonged QT interval  Assessment/Plan Principal Problem:   DKA (diabetic ketoacidosis) ([place]) Due to treatment noncompliance of   Type 2 diabetes mellitus ([place]) Observation/stepdown. NPO. Continue IV fluids. Continue insulin infusion. CBG monitoring every hour. Monitor electrolytes closely. Transition to SQ insulin per Endo tool. Consult diabetes coordinator.  Active Problems:   Leukocytosis Secondary to hemoconcentration. No fever, chills or obvious source of infection. Continue IV fluids and monitor for symptoms. Follow-up CBC in the morning.    Volume depletion Continue IV fluids.    Background diabetic retinopathy [facility]) Discussed importance of following with ophthalmology.      DVT prophylaxis: SCDs. Code  Status:   Full code. Family Communication:   Disposition Plan:   Patient is from:  Home.  Anticipated DC to:  Home.  Anticipated DC date:  11/20/2021 or 11/21/2021.  Anticipated DC barriers: Clinical status.  Consults called:   Admission status:  Observation/PCU.  Severity of Illness: High severity in the setting of DKA.  Reubin Milan MD [facility]  How to contact the [facility] Attending or Consulting provider [place] or covering provider during after hours [place], for this patient?   Check the care team in [facility] and look for a) attending/consulting [facility] provider listed and b) the [facility] team listed Log into [email or web address] and use [place]'s universal password to access. If you do not have the password, please contact the hospital operator. Locate the [facility] provider you are looking for under [facility] and page to a number that you can be directly reached. If you still have difficulty reaching the provider, please page the [facility] (Director on Call) for the Hospitalists listed on amion for assistance.  11/19/2021, 9:42 AM   This document was prepared using Dragon voice recognition software and may contain some unintended transcription errors.

## 2021-11-19 NOTE — ED Provider Notes (Signed)
[facility] DEPT Provider Note   CSN: [id number] Arrival date & time: 11/19/21  0146     History  Chief Complaint  Patient presents with   Hyperglycemia   Vomiting    Evelyn [name] is a 36 y.o. female with a hx of DM & migraines who presents to the ED with complaints of vomiting since 1600. Patient reports too numerous to count episodes of emesis, also had a couple episodes of diarrhea.  She has been unable to keep anything down.  No alleviating or aggravating factors.  She does note that she has a frontal headache that feels similar to prior migraines, gradual onset, steady progression, mild to moderate in severity currently, she states that previously migraine cocktails have helped with similar headaches.  She denies fever, melena, hematochezia, hematemesis, abdominal pain, dysuria, vaginal bleeding, or vaginal discharge.  She has not been taking any medications for her diabetes.  She has smoked marijuana in the past couple of days.  HPI     Home Medications Prior to Admission medications   Medication Sig Start Date End Date Taking? Authorizing Provider  glipiZIDE (GLUCOTROL) 5 MG tablet Take 1 tablet (5 mg total) by mouth 2 (two) times daily before a meal. 10/20/21 11/19/21 Yes Noemi Chapel, MD  insulin glargine (LANTUS) 100 UNIT/ML injection Inject 0.2 mLs (20 Units total) 2 (two) times daily into the skin. Patient taking differently: Inject 70 Units into the skin 2 (two) times daily. 09/01/17  Yes Rolland Porter, MD  insulin lispro (HUMALOG) 100 UNIT/ML injection Inject 0.1 mLs (10 Units total) 3 (three) times daily before meals into the skin. Patient taking differently: Inject 15 Units into the skin 3 (three) times daily before meals. 09/01/17  Yes Rolland Porter, MD  fluconazole (DIFLUCAN) 150 MG tablet Take 1 tablet on Day 1. Take additional tablet on Day 4 if symptoms persist. Patient not taking: Reported on 11/19/2021 02/15/20   Eustaquio Maize, PA-C   metroNIDAZOLE (FLAGYL) 500 MG tablet Take 1 tablet (500 mg total) by mouth 2 (two) times daily. Patient not taking: Reported on 11/19/2021 02/15/20   Eustaquio Maize, PA-C  ondansetron (ZOFRAN) 4 MG tablet Take 1 tablet (4 mg total) by mouth every 6 (six) hours. Patient not taking: Reported on 11/19/2021 10/20/21   Noemi Chapel, MD  oxyCODONE (ROXICODONE) 5 MG immediate release tablet Take 1 tablet (5 mg total) by mouth every 4 (four) hours as needed for severe pain. Patient not taking: Reported on 11/19/2021 04/21/21   Maudie Flakes, MD      Allergies    Morphine and related    Review of Systems   Review of Systems  Constitutional:  Negative for chills and fever.  Eyes:  Negative for visual disturbance.  Respiratory:  Negative for shortness of breath.   Cardiovascular:  Negative for chest pain.  Gastrointestinal:  Positive for diarrhea, nausea and vomiting. Negative for abdominal pain, anal bleeding, blood in stool and constipation.  Genitourinary:  Negative for dysuria, vaginal bleeding and vaginal discharge.  Neurological:  Positive for headaches. Negative for seizures, syncope, speech difficulty, weakness and numbness.  All other systems reviewed and are negative.  Physical Exam Updated Vital Signs BP 106/74    Pulse (!) 109    Temp 97.8 F (36.6 C) (Oral)    Resp 13    SpO2 98%  Physical Exam Vitals and nursing note reviewed.  Constitutional:      General: She is not in acute distress.  Appearance: She is well-developed. She is not toxic-appearing.  HENT:     Head: Normocephalic and atraumatic.  Eyes:     General:        Right eye: No discharge.        Left eye: No discharge.     Conjunctiva/sclera: Conjunctivae normal.  Cardiovascular:     Rate and Rhythm: Regular rhythm. Tachycardia present.  Pulmonary:     Effort: Pulmonary effort is normal. No respiratory distress.     Breath sounds: Normal breath sounds. No wheezing, rhonchi or rales.  Abdominal:     General:  There is no distension.     Palpations: Abdomen is soft.     Tenderness: There is no abdominal tenderness. There is no guarding or rebound.  Musculoskeletal:     Cervical back: Neck supple. No rigidity.  Skin:    General: Skin is warm and dry.     Findings: No rash.  Neurological:     General: No focal deficit present.     Mental Status: She is alert.     Comments: Clear speech.   Psychiatric:        Behavior: Behavior normal.    ED Results / Procedures / Treatments   Labs (all labs ordered are listed, but only abnormal results are displayed) Labs Reviewed  CBC WITH DIFFERENTIAL/PLATELET - Abnormal; Notable for the following components:      Result Value   WBC 18.4 (*)    Platelets 467 (*)    Neutro Abs 17.2 (*)    All other components within normal limits  COMPREHENSIVE METABOLIC PANEL - Abnormal; Notable for the following components:   CO2 18 (*)    Glucose, Bld 400 (*)    Total Protein 8.6 (*)    Anion gap 16 (*)    All other components within normal limits  CBG MONITORING, ED - Abnormal; Notable for the following components:   Glucose-Capillary 377 (*)    All other components within normal limits  I-STAT CHEM 8, ED - Abnormal; Notable for the following components:   BUN 21 (*)    Creatinine, Ser 0.30 (*)    Glucose, Bld 405 (*)    Calcium, Ion 1.13 (*)    TCO2 21 (*)    Hemoglobin 15.6 (*)    All other components within normal limits  MAGNESIUM  URINALYSIS, ROUTINE W REFLEX MICROSCOPIC  BLOOD GAS, VENOUS  I-STAT BETA HCG BLOOD, ED ([facility], [facility], [facility] ONLY)    EKG EKG Interpretation  Date/Time:  Sunday November 19 2021 02:39:41 EST Ventricular Rate:  104 PR Interval:  148 QRS Duration: 100 QT Interval:  371 QTC Calculation: 488 R Axis:   -6 Text Interpretation: Sinus tachycardia Consider right atrial enlargement Borderline prolonged QT interval Confirmed by Quintella Reichert [id number]) on 11/19/2021 3:27:23 AM  Radiology No results found.  Procedures Procedures     Medications Ordered in ED Medications  ondansetron (ZOFRAN) injection 4 mg (4 mg Intravenous Given 11/19/21 0222)  metoCLOPramide (REGLAN) injection 10 mg (10 mg Intravenous Given 11/19/21 0256)  diphenhydrAMINE (BENADRYL) injection 12.5 mg (12.5 mg Intravenous Given 11/19/21 0257)  sodium chloride 0.9 % bolus 2,000 mL (2,000 mLs Intravenous New Bag/Given 11/19/21 0259)    ED Course/ Medical Decision Making/ A&P                           Medical Decision Making Amount and/or Complexity of Data Reviewed Labs: ordered.  Risk Prescription drug  management. Decision regarding hospitalization.   Patient presents to the ED with complaints of nausea/vomiting, this involves an extensive number of treatment options, and is a complaint that carries with it a high risk of complications and morbidity. Nontoxic, vitals with tachycardia. Abdomen nontender w/o peritoneal signs.   Ddx including but not limited to DKA, ketoacidosis, viral GI illness, cholecystitis, GERD, PUD, pancreatitis, hyperemesis cannabinoid syndrome.  Additional history obtained:  Additional history obtained from chart review & nursing note review.  External records from outside source obtained and reviewed including most recent ED visit- presented w/ similar.   EKG: Sinus rhythm, Qtc < 500  Cardiac Monitoring:  The patient was maintained on a cardiac monitor.  I personally viewed and interpreted the cardiac monitored which showed an underlying rhythm of: sinus tachycardia.   Lab Tests:  I Ordered, reviewed, and interpreted labs, pertinent results include:  CBC: Leukocytosis as well as thrombocytosis CMP: Hyperglycemia at 400, bicarb 18, gap 16 renal function preserved. Lipase: Within normal limits  VBG: Slight acidosis with pH of 7.226 Magnesium: Within normal limits Pregnancy test: Negative  ED Course:  Patient feeling improved. Repeat BMP with closed gap however bicarb slightly worse, will give insulin- switched  3rd liter of fluid from NS to LR- RN made aware.   Patient care signed out to attending & AM team @ shift change pending re-evaluation & disposition.     This is a shared visit with supervising physician Dr. Ralene Bathe who has independently evaluated patient & provided guidance in evaluation/management/disposition, in agreement with care   Portions of this note were generated with Dragon dictation software. Dictation errors may occur despite best attempts at proofreading.         Final Clinical Impression(s) / ED Diagnoses Final diagnoses:  None    Rx / DC Orders ED Discharge Orders     None         Amaryllis Dyke, PA-C 11/19/21 0657    Quintella Reichert, MD 11/20/21 [phone]

## 2021-11-19 NOTE — Progress Notes (Signed)
[facility] admitting physician addendum:  The nursing staff reported that the patient is currently having a headache that has not been relieved by acetaminophen and dimming lights.  She has hypersensitivity to morphine and related medications.  Ketorolac 30 mg IVP x1 dose ordered.  Tennis Must, MD

## 2021-11-19 NOTE — Plan of Care (Signed)
Report received from ED nurse. VS taken upon pt arrival to unit. Pt oriented to room and call bell. Admission completed.   Problem: Education: Goal: Knowledge of General Education information will improve Description: Including pain rating scale, medication(s)/side effects and non-pharmacologic comfort measures Outcome: Progressing   Problem: Health Behavior/Discharge Planning: Goal: Ability to manage health-related needs will improve Outcome: Progressing   Problem: Clinical Measurements: Goal: Ability to maintain clinical measurements within normal limits will improve Outcome: Progressing Goal: Will remain free from infection Outcome: Progressing Goal: Diagnostic test results will improve Outcome: Progressing Goal: Cardiovascular complication will be avoided Outcome: Progressing   Problem: Nutrition: Goal: Adequate nutrition will be maintained Outcome: Progressing   Problem: Pain Managment: Goal: General experience of comfort will improve Outcome: Progressing   Problem: Safety: Goal: Ability to remain free from injury will improve Outcome: Progressing

## 2021-11-19 NOTE — ED Provider Notes (Signed)
Care assumed from [facility], PA-C at shift change pending reassessment and discharge vs. Admission. See her note for full HPI.  In short, patient is a 36 year old female who presents to the ED due to numerous episodes of nonbloody, nonbilious emesis since yesterday.  History of diabetes.  She also endorses marijuana use.  Physical Exam  BP 103/65    Pulse (!) 119    Temp 97.8 F (36.6 C) (Oral)    Resp 12    SpO2 98%   Physical Exam Vitals and nursing note reviewed.  Constitutional:      General: She is not in acute distress.    Appearance: She is not ill-appearing.  HENT:     Head: Normocephalic.  Eyes:     Pupils: Pupils are equal, round, and reactive to light.  Cardiovascular:     Rate and Rhythm: Regular rhythm. Tachycardia present.     Pulses: Normal pulses.     Heart sounds: Normal heart sounds. No murmur heard.   No friction rub. No gallop.  Pulmonary:     Effort: Pulmonary effort is normal.     Breath sounds: Normal breath sounds.  Abdominal:     General: Abdomen is flat. There is no distension.     Palpations: Abdomen is soft.     Tenderness: There is no abdominal tenderness. There is no guarding or rebound.  Musculoskeletal:        General: Normal range of motion.     Cervical back: Neck supple.  Skin:    General: Skin is warm and dry.  Neurological:     General: No focal deficit present.     Mental Status: She is alert.  Psychiatric:        Mood and Affect: Mood normal.        Behavior: Behavior normal.    Procedures  .Critical Care Performed by: Mannie Stabile, PA-C Authorized by: Mannie Stabile, PA-C   Critical care provider statement:    Critical care time (minutes):  30   Critical care was necessary to treat or prevent imminent or life-threatening deterioration of the following conditions:  Metabolic crisis   Critical care was time spent personally by me on the following activities:  Development of treatment plan with patient or  surrogate, discussions with consultants, evaluation of patient's response to treatment, examination of patient, ordering and review of laboratory studies, ordering and review of radiographic studies, ordering and performing treatments and interventions, pulse oximetry, re-evaluation of patient's condition and review of old charts   I assumed direction of critical care for this patient from another provider in my specialty: no     Care discussed with: admitting provider    ED Course / MDM    Medical Decision Making Amount and/or Complexity of Data Reviewed External Data Reviewed: notes. Labs: ordered. Decision-making details documented in ED Course.  Risk Prescription drug management. Decision regarding hospitalization.  She presents to the ED due to nausea and vomiting.  Known history of diabetes.  Also endorses marijuana use.  Upon reassessment at shift change, patient remains significantly tachycardic after IV fluids and 1 dose insulin.  Given worsening bicarb and labs, will start on insulin drip for DKA.  Maintenance IV fluids started. Patient will require admission for DKA.  7:47 AM Discussed with Dr. Robb Matar with [facility] who agrees to admit patient for DKA.   Discussed with Dr. Madilyn Hook who evaluated patient at bedside and agrees with assessment and plan.  Jesusita Oka 11/19/21 0750    Cheryll Cockayne, MD 11/22/21 [phone]

## 2021-11-19 NOTE — ED Triage Notes (Signed)
Patient states sudden onset of vomiting, abdominal cramping, and hyperglycemia. Patient states sugar 360s at home. Patient states no hx of DKA. Patient actively vomiting in triage.

## 2021-11-19 NOTE — ED Notes (Signed)
Given crackers and water for PO challenge

## 2021-11-19 NOTE — Progress Notes (Signed)
Inpatient Diabetes Program Recommendations  AACE/ADA: New Consensus Statement on Inpatient Glycemic Control (2015)  Target Ranges:  Prepandial:   less than 140 mg/dL      Peak postprandial:   less than 180 mg/dL (1-2 hours)      Critically ill patients:  140 - 180 mg/dL   Lab Results  Component Value Date   GLUCAP 178 (H) 11/19/2021   HGBA1C 11.9 (H) 11/19/2021    Review of Glycemic Control  Latest Reference Range & Units 11/19/21 07:45 11/19/21 09:07 11/19/21 10:06 11/19/21 11:01  Glucose-Capillary 70 - 99 mg/dL 412 (H) 878 (H) 676 (H) 178 (H)   Diabetes history: DM 2 Outpatient Diabetes medications:  Lantus 70 units bid, Humalog 15 units tid with meals, Glucotrol 5 mg bid Current orders for Inpatient glycemic control:  IV insulin-DKA order set  Inpatient Diabetes Program Recommendations:    Once patient is ready for transition off insulin drip, consider Semglee 20 units bid and Novolog sensitive q 4 hours. Will follow-up with patient on 11/20/21 regarding elevated A1C.    Thanks,  Beryl Meager, RN, BC-ADM Inpatient Diabetes Coordinator Pager [phone]  (8a-5p)

## 2021-11-20 DIAGNOSIS — E1165 Type 2 diabetes mellitus with hyperglycemia: Secondary | ICD-10-CM

## 2021-11-20 DIAGNOSIS — D72829 Elevated white blood cell count, unspecified: Secondary | ICD-10-CM

## 2021-11-20 DIAGNOSIS — Z794 Long term (current) use of insulin: Secondary | ICD-10-CM

## 2021-11-20 DIAGNOSIS — Z720 Tobacco use: Secondary | ICD-10-CM

## 2021-11-20 DIAGNOSIS — E663 Overweight: Secondary | ICD-10-CM

## 2021-11-20 LAB — GLUCOSE, CAPILLARY
Glucose-Capillary: 171 mg/dL — ABNORMAL HIGH (ref 70–99)
Glucose-Capillary: 140 mg/dL — ABNORMAL HIGH (ref 70–99)

## 2021-11-20 LAB — COMPREHENSIVE METABOLIC PANEL
ALT: 19 U/L (ref 0–44)
AST: 25 U/L (ref 15–41)
Albumin: 2.9 g/dL — ABNORMAL LOW (ref 3.5–5.0)
Alkaline Phosphatase: 35 U/L — ABNORMAL LOW (ref 38–126)
Anion gap: 5 (ref 5–15)
BUN: 11 mg/dL (ref 6–20)
CO2: 25 mmol/L (ref 22–32)
Calcium: 8.1 mg/dL — ABNORMAL LOW (ref 8.9–10.3)
Chloride: 106 mmol/L (ref 98–111)
Creatinine, Ser: 0.35 mg/dL — ABNORMAL LOW (ref 0.44–1.00)
GFR, Estimated: >60 mL/min (ref >60)
Glucose, Bld: 209 mg/dL — ABNORMAL HIGH (ref 70–99)
Potassium: 3.4 mmol/L — ABNORMAL LOW (ref 3.5–5.1)
Sodium: 136 mmol/L (ref 135–145)
Total Bilirubin: 0.9 mg/dL (ref 0.3–1.2)
Total Protein: 5.1 g/dL — ABNORMAL LOW (ref 6.5–8.1)

## 2021-11-20 LAB — CBC
HCT: 31.6 % — ABNORMAL LOW (ref 36.0–46.0)
Hemoglobin: 10.4 g/dL — ABNORMAL LOW (ref 12.0–15.0)
MCH: 29.2 pg (ref 26.0–34.0)
MCHC: 32.9 g/dL (ref 30.0–36.0)
MCV: 88.8 fL (ref 80.0–100.0)
Platelets: 316 10*3/uL (ref 150–400)
RBC: 3.56 MIL/uL — ABNORMAL LOW (ref 3.87–5.11)
RDW: 12.7 % (ref 11.5–15.5)
WBC: 8.2 10*3/uL (ref 4.0–10.5)
nRBC: 0 % (ref 0.0–0.2)

## 2021-11-20 LAB — HIV ANTIBODY (ROUTINE TESTING W REFLEX): HIV Screen 4th Generation wRfx: NONREACTIVE

## 2021-11-20 MED ORDER — HUMALOG 100 UNIT/ML ~~LOC~~ SOLN: 10.0000 [IU] | Freq: Three times a day (TID) | SUBCUTANEOUS | 2 refills | Status: DC

## 2021-11-20 MED ORDER — GLIPIZIDE 5 MG PO TABS: 5.0000 mg | ORAL_TABLET | Freq: Two times a day (BID) | ORAL | 0 refills | Status: DC

## 2021-11-20 MED ORDER — INSULIN GLARGINE 100 UNIT/ML ~~LOC~~ SOLN: 20.0000 [IU] | Freq: Two times a day (BID) | SUBCUTANEOUS | 0 refills | Status: DC

## 2021-11-20 NOTE — TOC Transition Note (Signed)
Transition of Care [facility]) - CM/SW Discharge Note   Patient Details  Name: ANNALYSIA WILLENBRING MRN: [id number] Date of Birth: 29-Mar-1986  [facility] CM/SW Contact:  Lanier Clam, RN Phone Number: 11/20/2021, 12:29 PM   Clinical Narrative: Spoke to patient about no health insurance-she has a pcp;does not qualify for medicaid,has family planning medicaid.Gets her medicine through [facility] Dept-they have her on a program-she will continue to use this program & is not interested in [facility] program-she is already established & this program she can use her pharmacy. She declines M[facility] program. No further CM needs.      Final next level of care: Home/Self Care Barriers to Discharge: No Barriers Identified   Patient Goals and CMS Choice Patient states their goals for this hospitalization and ongoing recovery are:: home CMS Medicare.gov Compare Post Acute Care list provided to:: Patient Choice offered to / list presented to : Patient  Discharge Placement                       Discharge Plan and Services   Discharge Planning Services: CM Consult Post Acute Care Choice: NA                               Social Determinants of Health (SDOH) Interventions     Readmission Risk Interventions No flowsheet data found.

## 2021-11-20 NOTE — Hospital Course (Addendum)
36 year old female with past medical history significant for type 2 diabetes mellitus and migraine headaches who reportedly has not been taking her medications for the past 2 months.  She presented to the emergency room on 1/29 with intractable vomiting since the day prior as well as abdominal pain.  Patient found to have a white count of 18.4, CBG of 400 and anion gap of 16.  Admitted to hospitalist service for DKA and started on insulin drip plus IV fluids.  Anion gap resolved after and patient transitioned over to her twice daily Lantus.

## 2021-11-20 NOTE — Progress Notes (Signed)
Inpatient Diabetes Program Recommendations  AACE/ADA: New Consensus Statement on Inpatient Glycemic Control (2015)  Target Ranges:  Prepandial:   less than 140 mg/dL      Peak postprandial:   less than 180 mg/dL (1-2 hours)      Critically ill patients:  140 - 180 mg/dL   Lab Results  Component Value Date   GLUCAP 140 (H) 11/20/2021   HGBA1C 11.9 (H) 11/19/2021    Review of Glycemic Control  Latest Reference Range & Units 11/19/21 12:05 11/19/21 13:09 11/19/21 14:13 11/19/21 17:08 11/19/21 21:06 11/20/21 07:55 11/20/21 12:05  Glucose-Capillary 70 - 99 mg/dL 182 (H) 170 (H) 147 (H) 142 (H) 267 (H) 171 (H) 140 (H)   Diabetes history: DM 2 Outpatient Diabetes medications:  Lantus 70 units bid, Humalog 15 units tid with meals, Glucotrol 5 mg bid Current orders for Inpatient glycemic control:  Semglee 20 units bid, Novolog moderate tid with meals Inpatient Diabetes Program Recommendations:    Spoke with patient at bedside.  She has not been taking insulin or checking blood sugars recently due to not having insulin at home.  She states that she was diagnosed with DM during Pregnancy and after birth of child in 2014, she was on insulin.  She see's provider at [facility].  She has already made appointment for f/u and is in the process of re-filling out paperwork for med assistance.   Discussed goal A1C and blood sugars.  Discussed current A1C and the ill effects of high blood sugars.  Patient states that she is motivated to get blood sugars under control  Briefly reviewed signs and symptoms of low and high blood sugars and how to treat.  She admitted that she increases dose of Lantus based on what she eats.  Explained that Lantus dose is basal and not affected by what she eats. Therefore Humalog is the insulin that covers food.  Discussed CHO content of foods and importance of consistent intake as well as portion control.  Gave her handout on foods and CHO contents and also reviewed  plate method.  Encouraged monitoring and close f/u with PCP. Patient verbalized understanding.   Thanks,  Adah Perl, RN, BC-ADM Inpatient Diabetes Coordinator Pager [phone]  (8a-5p)

## 2021-11-20 NOTE — Discharge Instructions (Signed)
You were hospitalized for blood sugar management on 11/19/21 and being discharged with diabetes medication prescriptions on 11/20/21. Please fill these prescriptions and continue to check blood sugars so you and your doctor can manage blood sugar trends.

## 2021-11-20 NOTE — Assessment & Plan Note (Signed)
Mild migraine during hospitalization, much improved after she ate

## 2021-11-20 NOTE — Assessment & Plan Note (Signed)
Meets criteria BMI greater than 25 

## 2021-11-20 NOTE — Progress Notes (Signed)
Patient dc with AVS, reviewed medications and diabetic friendly food options. IV and tele removed. Pt collected all belongings. Ambulated with patient to the lobby.

## 2021-11-20 NOTE — Assessment & Plan Note (Signed)
Uncontrolled with hyperglycemia on long-term use of insulin.  Patient is at difficulty at times with her medications.  Metformin causes her nausea.  We will plan to discharge with physical prescriptions for her Lantus, Humalog and Glucotrol.  Diabetes coordinator provided her with education explaining that she should not adjust her Lantus based off of her immediate blood sugars but adjust the Humalog.

## 2021-11-20 NOTE — Assessment & Plan Note (Signed)
Look to be stress margination due to DKA.  Resolved without antibiotic intervention

## 2021-11-20 NOTE — Assessment & Plan Note (Signed)
Counseled

## 2021-11-20 NOTE — Plan of Care (Signed)
°  Problem: Education: Goal: Knowledge of General Education information will improve Description: Including pain rating scale, medication(s)/side effects and non-pharmacologic comfort measures Outcome: Adequate for Discharge   Problem: Health Behavior/Discharge Planning: Goal: Ability to manage health-related needs will improve Outcome: Adequate for Discharge   Problem: Clinical Measurements: Goal: Ability to maintain clinical measurements within normal limits will improve Outcome: Adequate for Discharge Goal: Will remain free from infection Outcome: Adequate for Discharge Goal: Diagnostic test results will improve Outcome: Adequate for Discharge Goal: Cardiovascular complication will be avoided Outcome: Adequate for Discharge   Problem: Nutrition: Goal: Adequate nutrition will be maintained Outcome: Adequate for Discharge   Problem: Pain Managment: Goal: General experience of comfort will improve Outcome: Adequate for Discharge   Problem: Safety: Goal: Ability to remain free from injury will improve Outcome: Adequate for Discharge

## 2021-11-20 NOTE — Assessment & Plan Note (Signed)
Resolved with IV fluids and IV insulin.  Transitioned over to twice a day Lantus.  Patient has had difficulty getting her medications.  Has been set up with the health department.  We will give her printed prescriptions for her to take.

## 2021-11-20 NOTE — Discharge Summary (Signed)
Physician Discharge Summary   Patient: Evelyn [name] MRN: [id number] DOB: 06-14-1986  Admit date:     11/19/2021  Discharge date: 11/20/21  Discharge Physician: Hollice Espy   PCP: [facility], [facility]   Recommendations at discharge:   Medication change: Discontinue metformin (patient does not take it, causes her nausea) Physical prescription given for her Lantus 20 units SQ twice daily, Humalog 10 units 3 times daily with meals as well as Glucotrol 5 p.o. daily  Discharge Diagnoses Active Problems:   Type 2 diabetes mellitus (HCC)   Migraines   Leukocytosis   Tobacco abuse   Overweight (BMI 25.0-29.9)   Background diabetic retinopathy (HCC)  Principal Problem (Resolved):   DKA (diabetic ketoacidosis) [facility])   Hospital Course   36 year old female with past medical history significant for type 2 diabetes mellitus and migraine headaches who reportedly has not been taking her medications for the past 2 months.  She presented to the emergency room on 1/29 with intractable vomiting since the day prior as well as abdominal pain.  Patient found to have a white count of 18.4, CBG of 400 and anion gap of 16.  Admitted to hospitalist service for DKA and started on insulin drip plus IV fluids.  Anion gap resolved after and patient transitioned over to twice daily Lantus.  * DKA (diabetic ketoacidosis) (HCC)-resolved as of 11/20/2021, (present on admission) Resolved with IV fluids and IV insulin.  Transitioned over to twice a day Lantus.  Patient has had difficulty getting her medications.  Has been set up with the health department.  We will give her printed prescriptions for her to take.  Type 2 diabetes mellitus (HCC) Uncontrolled with hyperglycemia on long-term use of insulin.  Patient is at difficulty at times with her medications.  Metformin causes her nausea.  We will plan to discharge with physical prescriptions for her Lantus, Humalog and Glucotrol.  Diabetes coordinator  provided her with education explaining that she should not adjust her Lantus based off of her immediate blood sugars but adjust the Humalog.  Migraines- (present on admission) Mild migraine during hospitalization, much improved after she ate  Leukocytosis- (present on admission) Look to be stress margination due to DKA.  Resolved without antibiotic intervention  Tobacco abuse- (present on admission) Counseled.  Overweight (BMI 25.0-29.9)- (present on admission) Meets criteria BMI greater than 25        Consultants: Diabetes coordinator Procedures performed: None Disposition: Home Diet recommendation: Carb modified diet  DISCHARGE MEDICATION: Allergies as of 11/20/2021       Reactions   Morphine And Related Itching        Medication List     TAKE these medications    glipiZIDE 5 MG tablet Commonly known as: Glucotrol Take 1 tablet (5 mg total) by mouth 2 (two) times daily before a meal.   HumaLOG 100 UNIT/ML injection Generic drug: insulin lispro Inject 0.1 mLs (10 Units total) into the skin 3 (three) times daily before meals. What changed: how much to take   insulin glargine 100 UNIT/ML injection Commonly known as: LANTUS Inject 0.2 mLs (20 Units total) into the skin 2 (two) times daily. What changed: how much to take         Follow-up Information     [facility], [facility] Follow up in 1 week(s).   Contact information: [address] [place] [address] [phone]                 Discharge  Exam: Filed Weights   11/19/21 1528  Weight: 69.1 kg   General: Alert and oriented x3, no acute distress Cardiovascular: Regular rate and rhythm, S1-S2 Lungs: Clear auscultation bilaterally  Condition at discharge: good  The results of significant diagnostics from this hospitalization (including imaging, microbiology, ancillary and laboratory) are listed below for reference.   Imaging Studies: No results  found.  Microbiology: Results for orders placed or performed during the hospital encounter of 11/19/21  Resp Panel by RT-PCR (Flu A&B, Covid) Nasopharyngeal Swab     Status: None   Collection Time: 11/19/21  8:51 AM   Specimen: Nasopharyngeal Swab; Nasopharyngeal(NP) swabs in vial transport medium  Result Value Ref Range Status   SARS Coronavirus 2 by RT PCR NEGATIVE NEGATIVE Final    Comment: (NOTE) SARS-CoV-2 target nucleic acids are NOT DETECTED.  The SARS-CoV-2 RNA is generally detectable in upper respiratory specimens during the acute phase of infection. The lowest concentration of SARS-CoV-2 viral copies this assay can detect is 138 copies/mL. A negative result does not preclude SARS-Cov-2 infection and should not be used as the sole basis for treatment or other patient management decisions. A negative result may occur with  improper specimen collection/handling, submission of specimen other than nasopharyngeal swab, presence of viral mutation(s) within the areas targeted by this assay, and inadequate number of viral copies(<138 copies/mL). A negative result must be combined with clinical observations, patient history, and epidemiological information. The expected result is Negative.  Fact Sheet for Patients:  BloggerCourse.com  Fact Sheet for Healthcare Providers:  SeriousBroker.it  This test is no t yet approved or cleared by the [place] FDA and  has been authorized for detection and/or diagnosis of SARS-CoV-2 by FDA under an Emergency Use Authorization (EUA). This EUA will remain  in effect (meaning this test can be used) for the duration of the COVID-19 declaration under Section 564(b)(1) of the Act, 21 U.S.C.section 360bbb-3(b)(1), unless the authorization is terminated  or revoked sooner.       Influenza A by PCR NEGATIVE NEGATIVE Final   Influenza B by PCR NEGATIVE NEGATIVE Final    Comment: (NOTE) The  Xpert Xpress SARS-CoV-2/FLU/RSV plus assay is intended as an aid in the diagnosis of influenza from Nasopharyngeal swab specimens and should not be used as a sole basis for treatment. Nasal washings and aspirates are unacceptable for Xpert Xpress SARS-CoV-2/FLU/RSV testing.  Fact Sheet for Patients: BloggerCourse.com  Fact Sheet for Healthcare Providers: SeriousBroker.it  This test is not yet approved or cleared by the [place] FDA and has been authorized for detection and/or diagnosis of SARS-CoV-2 by FDA under an Emergency Use Authorization (EUA). This EUA will remain in effect (meaning this test can be used) for the duration of the COVID-19 declaration under Section 564(b)(1) of the Act, 21 U.S.C. section 360bbb-3(b)(1), unless the authorization is terminated or revoked.  Performed at [facility], [address], [place], [place] [address]     Labs: CBC: Recent Labs  Lab 11/19/21 0221 11/19/21 0230 11/20/21 0431  WBC 18.4*  --  8.2  NEUTROABS 17.2*  --   --   HGB 14.5 15.6* 10.4*  HCT 43.7 46.0 31.6*  MCV 87.8  --  88.8  PLT 467*  --  316   Basic Metabolic Panel: Recent Labs  Lab 11/19/21 0221 11/19/21 0230 11/19/21 0610 11/19/21 1000 11/19/21 1352 11/19/21 1812 11/20/21 0431  NA 137   < > 135 137 136 135 136  K 4.4   < >  4.4 3.5 3.5 3.6 3.4*  CL 103   < > 107 108 110 106 106  CO2 18*  --  17* 21* 22 23 25   GLUCOSE 400*   < > 279* 226* 169* 180* 209*  BUN 18   < > 15 14 12 11 11   CREATININE 0.54   < > 0.32* <0.30* 0.30* 0.34* 0.35*  CALCIUM 9.6  --  8.2* 7.9* 7.9* 8.0* 8.1*  MG 2.1  --   --   --   --   --   --   PHOS  --   --   --  2.7  --   --   --    < > = values in this interval not displayed.   Liver Function Tests: Recent Labs  Lab 11/19/21 0221 11/20/21 0431  AST 21 25  ALT 20 19  ALKPHOS 67 35*  BILITOT 1.2 0.9  PROT 8.6* 5.1*  ALBUMIN 4.7 2.9*   CBG: Recent  Labs  Lab 11/19/21 1413 11/19/21 1708 11/19/21 2106 11/20/21 0755 11/20/21 1205  GLUCAP 147* 142* 267* 171* 140*    Discharge time spent: less than 30 minutes.  Signed: Hollice EspySendil K Randye Treichler, MD [facility] 11/20/2021

## 2021-11-20 NOTE — TOC Initial Note (Signed)
Transition of Care [facility]) - Initial/Assessment Note    Patient Details  Name: Evelyn [name] MRN: [id number] Date of Birth: [birth year]  [facility] of [facility] CM/SW Contact:    Lanier Clam, RN Phone Number: 11/20/2021, 9:45 AM  Clinical Narrative: Lives in [place]-has pcp. No CM needs.                  Expected Discharge Plan: Home/Self Care Barriers to Discharge: Continued Medical Work up   Patient Goals and CMS Choice Patient states their goals for this hospitalization and ongoing recovery are:: home CMS Medicare.gov Compare Post Acute Care list provided to:: Patient Choice offered to / list presented to : Patient  Expected Discharge Plan and Services Expected Discharge Plan: Home/Self Care   Discharge Planning Services: CM Consult Post Acute Care Choice: NA Living arrangements for the past 2 months: Single Family Home                                      Prior Living Arrangements/Services Living arrangements for the past 2 months: Single Family Home Lives with:: Self                   Activities of Daily Living Home Assistive Devices/Equipment: Eyeglasses ADL Screening (condition at time of admission) Patient's cognitive ability adequate to safely complete daily activities?: Yes Is the patient deaf or have difficulty hearing?: No Does the patient have difficulty seeing, even when wearing glasses/contacts?: No Does the patient have difficulty concentrating, remembering, or making decisions?: No Patient able to express need for assistance with ADLs?: Yes Does the patient have difficulty dressing or bathing?: No Independently performs ADLs?: Yes (appropriate for developmental age) Does the patient have difficulty walking or climbing stairs?: No Weakness of Legs: None Weakness of Arms/Hands: None  Permission Sought/Granted                  Emotional Assessment              Admission diagnosis:  DKA (diabetic ketoacidosis)  (HCC) [E11.10] Patient Active Problem List   Diagnosis Date Noted   DKA (diabetic ketoacidosis) (HCC) 11/19/2021   Volume depletion 11/19/2021   Leukocytosis 11/19/2021   Situational stress 12/18/2015   Background diabetic retinopathy (HCC) 12/18/2015   S/P repeat low transverse C-section 08/06/2013   Type 2 diabetes mellitus (HCC) 08/06/2013   Migraines 12/23/2012   History of trichomoniasis 12/23/2012   HSV (herpes simplex virus) infection 12/23/2012   Type 1 diabetes mellitus (HCC) 09/07/2010   PCP:  [facility], [facility] Pharmacy:   [facility] [address] [place] - [address] [place] [address] Phone: [phone] Fax: [phone]  [id number] - [place], [place] - [address] [place] [address] Phone: [phone] Fax: [phone]     Social Determinants of Health (SDOH) Interventions    Readmission Risk Interventions No flowsheet data found.

## 2022-05-16 ENCOUNTER — Ambulatory Visit: Payer: Medicaid Other | Admitting: Adult Health

## 2022-06-26 ENCOUNTER — Emergency Department (HOSPITAL_COMMUNITY): Payer: Self-pay

## 2022-06-26 ENCOUNTER — Other Ambulatory Visit: Payer: Self-pay

## 2022-06-26 ENCOUNTER — Inpatient Hospital Stay (HOSPITAL_COMMUNITY)
Admission: EM | Admit: 2022-06-26 | Discharge: 2022-06-28 | DRG: 603 | Disposition: A | Discharge status: Home / Self Care | Payer: Self-pay | Attending: Internal Medicine | Admitting: Internal Medicine

## 2022-06-26 ENCOUNTER — Encounter (HOSPITAL_COMMUNITY): Payer: Self-pay

## 2022-06-26 DIAGNOSIS — R000 Tachycardia, unspecified: Secondary | ICD-10-CM

## 2022-06-26 DIAGNOSIS — L0390 Cellulitis, unspecified: Secondary | ICD-10-CM | POA: Diagnosis present

## 2022-06-26 DIAGNOSIS — Z833 Family history of diabetes mellitus: Secondary | ICD-10-CM

## 2022-06-26 DIAGNOSIS — E663 Overweight: Secondary | ICD-10-CM | POA: Diagnosis present

## 2022-06-26 DIAGNOSIS — Z7984 Long term (current) use of oral hypoglycemic drugs: Secondary | ICD-10-CM

## 2022-06-26 DIAGNOSIS — L03317 Cellulitis of buttock: Principal | ICD-10-CM | POA: Diagnosis present

## 2022-06-26 DIAGNOSIS — Z6824 Body mass index (BMI) 24.0-24.9, adult: Secondary | ICD-10-CM

## 2022-06-26 DIAGNOSIS — E876 Hypokalemia: Secondary | ICD-10-CM | POA: Diagnosis present

## 2022-06-26 DIAGNOSIS — Z20822 Contact with and (suspected) exposure to covid-19: Secondary | ICD-10-CM | POA: Diagnosis present

## 2022-06-26 DIAGNOSIS — Z794 Long term (current) use of insulin: Secondary | ICD-10-CM

## 2022-06-26 DIAGNOSIS — Z8249 Family history of ischemic heart disease and other diseases of the circulatory system: Secondary | ICD-10-CM

## 2022-06-26 DIAGNOSIS — Z885 Allergy status to narcotic agent status: Secondary | ICD-10-CM

## 2022-06-26 DIAGNOSIS — F17210 Nicotine dependence, cigarettes, uncomplicated: Secondary | ICD-10-CM | POA: Diagnosis present

## 2022-06-26 DIAGNOSIS — L0231 Cutaneous abscess of buttock: Secondary | ICD-10-CM | POA: Diagnosis present

## 2022-06-26 DIAGNOSIS — R110 Nausea: Principal | ICD-10-CM

## 2022-06-26 DIAGNOSIS — E119 Type 2 diabetes mellitus without complications: Secondary | ICD-10-CM

## 2022-06-26 DIAGNOSIS — E1165 Type 2 diabetes mellitus with hyperglycemia: Secondary | ICD-10-CM | POA: Diagnosis present

## 2022-06-26 LAB — HEMOGLOBIN A1C
Hgb A1c MFr Bld: 9.7 % — ABNORMAL HIGH (ref 4.8–5.6)
Mean Plasma Glucose: 231.69 mg/dL

## 2022-06-26 LAB — CBC WITH DIFFERENTIAL/PLATELET
Abs Immature Granulocytes: 0.26 10*3/uL — ABNORMAL HIGH (ref 0.00–0.07)
Basophils Absolute: 0.1 10*3/uL (ref 0.0–0.1)
Basophils Relative: 0 %
Eosinophils Absolute: 0 10*3/uL (ref 0.0–0.5)
Eosinophils Relative: 0 %
HCT: 35.3 % — ABNORMAL LOW (ref 36.0–46.0)
Hemoglobin: 12.2 g/dL (ref 12.0–15.0)
Immature Granulocytes: 1 %
Lymphocytes Relative: 8 %
Lymphs Abs: 1.7 10*3/uL (ref 0.7–4.0)
MCH: 30.2 pg (ref 26.0–34.0)
MCHC: 34.6 g/dL (ref 30.0–36.0)
MCV: 87.4 fL (ref 80.0–100.0)
Monocytes Absolute: 1.9 10*3/uL — ABNORMAL HIGH (ref 0.1–1.0)
Monocytes Relative: 8 %
Neutro Abs: 18.6 10*3/uL — ABNORMAL HIGH (ref 1.7–7.7)
Neutrophils Relative %: 83 %
Platelets: 375 10*3/uL (ref 150–400)
RBC: 4.04 MIL/uL (ref 3.87–5.11)
RDW: 12.4 % (ref 11.5–15.5)
WBC: 22.5 10*3/uL — ABNORMAL HIGH (ref 4.0–10.5)
nRBC: 0 % (ref 0.0–0.2)

## 2022-06-26 LAB — URINALYSIS, ROUTINE W REFLEX MICROSCOPIC
Bacteria, UA: NONE SEEN
Bilirubin Urine: NEGATIVE
Glucose, UA: >=500 mg/dL — AB
Ketones, ur: 80 mg/dL — AB
Leukocytes,Ua: NEGATIVE
Nitrite: NEGATIVE
Protein, ur: NEGATIVE mg/dL
Specific Gravity, Urine: 1.036 — ABNORMAL HIGH (ref 1.005–1.030)
pH: 5 (ref 5.0–8.0)

## 2022-06-26 LAB — MAGNESIUM: Magnesium: 1.6 mg/dL — ABNORMAL LOW (ref 1.7–2.4)

## 2022-06-26 LAB — COMPREHENSIVE METABOLIC PANEL
ALT: 13 U/L (ref 0–44)
AST: 13 U/L — ABNORMAL LOW (ref 15–41)
Albumin: 3.6 g/dL (ref 3.5–5.0)
Alkaline Phosphatase: 58 U/L (ref 38–126)
Anion gap: 11 (ref 5–15)
BUN: 13 mg/dL (ref 6–20)
CO2: 21 mmol/L — ABNORMAL LOW (ref 22–32)
Calcium: 8.9 mg/dL (ref 8.9–10.3)
Chloride: 105 mmol/L (ref 98–111)
Creatinine, Ser: 0.44 mg/dL (ref 0.44–1.00)
GFR, Estimated: >60 mL/min (ref >60)
Glucose, Bld: 260 mg/dL — ABNORMAL HIGH (ref 70–99)
Potassium: 3.4 mmol/L — ABNORMAL LOW (ref 3.5–5.1)
Sodium: 137 mmol/L (ref 135–145)
Total Bilirubin: 1.2 mg/dL (ref 0.3–1.2)
Total Protein: 7.4 g/dL (ref 6.5–8.1)

## 2022-06-26 LAB — PROTIME-INR
INR: 1.2 (ref 0.8–1.2)
Prothrombin Time: 14.7 seconds (ref 11.4–15.2)

## 2022-06-26 LAB — TSH: TSH: 0.354 u[IU]/mL (ref 0.350–4.500)

## 2022-06-26 LAB — RESP PANEL BY RT-PCR (FLU A&B, COVID) ARPGX2
Influenza A by PCR: NEGATIVE
Influenza B by PCR: NEGATIVE
SARS Coronavirus 2 by RT PCR: NEGATIVE

## 2022-06-26 LAB — APTT: aPTT: 33 seconds (ref 24–36)

## 2022-06-26 LAB — GLUCOSE, CAPILLARY
Glucose-Capillary: 172 mg/dL — ABNORMAL HIGH (ref 70–99)
Glucose-Capillary: 210 mg/dL — ABNORMAL HIGH (ref 70–99)

## 2022-06-26 LAB — LACTIC ACID, PLASMA
Lactic Acid, Venous: 0.9 mmol/L (ref 0.5–1.9)
Lactic Acid, Venous: 1 mmol/L (ref 0.5–1.9)

## 2022-06-26 LAB — I-STAT BETA HCG BLOOD, ED (MC, WL, AP ONLY): I-stat hCG, quantitative: <5 m[IU]/mL (ref <5)

## 2022-06-26 LAB — PROCALCITONIN: Procalcitonin: <0.1 ng/mL

## 2022-06-26 MED ORDER — VANCOMYCIN HCL 1500 MG/300ML IV SOLN
1500.0000 mg | Freq: Once | INTRAVENOUS | Status: AC
  Administered 2022-06-26: 1500 mg via INTRAVENOUS
  Filled 2022-06-26 (×2): qty 300

## 2022-06-26 MED ORDER — ORAL CARE MOUTH RINSE
15.0000 mL | OROMUCOSAL | Status: DC | PRN
Start: 2022-06-26 — End: 2022-06-28

## 2022-06-26 MED ORDER — ENOXAPARIN SODIUM 40 MG/0.4ML IJ SOSY
40.0000 mg | PREFILLED_SYRINGE | Freq: Every day | INTRAMUSCULAR | Status: DC
  Administered 2022-06-26 – 2022-06-27 (×2): 40 mg via SUBCUTANEOUS
  Filled 2022-06-26 (×2): qty 0.4

## 2022-06-26 MED ORDER — SODIUM CHLORIDE 0.9 % IV BOLUS
1000.0000 mL | Freq: Once | INTRAVENOUS | Status: AC
  Administered 2022-06-26: 1000 mL via INTRAVENOUS

## 2022-06-26 MED ORDER — ACETAMINOPHEN 650 MG RE SUPP: 650.0000 mg | Freq: Four times a day (QID) | RECTAL | Status: DC | PRN

## 2022-06-26 MED ORDER — MAGNESIUM SULFATE 2 GM/50ML IV SOLN
2.0000 g | Freq: Once | INTRAVENOUS | Status: AC
  Administered 2022-06-26: 2 g via INTRAVENOUS
  Filled 2022-06-26: qty 50

## 2022-06-26 MED ORDER — ONDANSETRON HCL 4 MG PO TABS: 4.0000 mg | ORAL_TABLET | Freq: Four times a day (QID) | ORAL | Status: DC | PRN

## 2022-06-26 MED ORDER — INSULIN ASPART 100 UNIT/ML IJ SOLN
0.0000 [IU] | Freq: Every day | INTRAMUSCULAR | Status: DC
  Administered 2022-06-26: 2 [IU] via SUBCUTANEOUS
  Filled 2022-06-26: qty 0.05

## 2022-06-26 MED ORDER — SODIUM CHLORIDE 0.9% FLUSH
3.0000 mL | Freq: Two times a day (BID) | INTRAVENOUS | Status: DC
  Administered 2022-06-26 – 2022-06-27 (×4): 3 mL via INTRAVENOUS

## 2022-06-26 MED ORDER — HYDROMORPHONE HCL 2 MG/ML IJ SOLN
0.5000 mg | Freq: Once | INTRAMUSCULAR | Status: AC
  Administered 2022-06-26: 0.5 mg via INTRAVENOUS
  Filled 2022-06-26: qty 1

## 2022-06-26 MED ORDER — INSULIN ASPART 100 UNIT/ML IJ SOLN
0.0000 [IU] | Freq: Three times a day (TID) | INTRAMUSCULAR | Status: DC
  Administered 2022-06-26 – 2022-06-28 (×5): 3 [IU] via SUBCUTANEOUS
  Filled 2022-06-26: qty 0.15

## 2022-06-26 MED ORDER — ONDANSETRON HCL 4 MG/2ML IJ SOLN
4.0000 mg | Freq: Once | INTRAMUSCULAR | Status: AC
  Administered 2022-06-26: 4 mg via INTRAVENOUS
  Filled 2022-06-26: qty 2

## 2022-06-26 MED ORDER — BOOST / RESOURCE BREEZE PO LIQD CUSTOM
1.0000 | Freq: Three times a day (TID) | ORAL | Status: DC
  Administered 2022-06-27: 1 via ORAL

## 2022-06-26 MED ORDER — SODIUM CHLORIDE 0.9 % IV SOLN
12.5000 mg | Freq: Four times a day (QID) | INTRAVENOUS | Status: DC | PRN
  Administered 2022-06-26: 12.5 mg via INTRAVENOUS
  Filled 2022-06-26: qty 0.5
  Filled 2022-06-26: qty 12.5

## 2022-06-26 MED ORDER — ACETAMINOPHEN 325 MG PO TABS
650.0000 mg | ORAL_TABLET | Freq: Four times a day (QID) | ORAL | Status: DC | PRN
  Administered 2022-06-27 – 2022-06-28 (×2): 650 mg via ORAL
  Filled 2022-06-26 (×2): qty 2

## 2022-06-26 MED ORDER — SODIUM CHLORIDE 0.9 % IV SOLN
2.0000 g | Freq: Once | INTRAVENOUS | Status: AC
  Administered 2022-06-26: 2 g via INTRAVENOUS
  Filled 2022-06-26: qty 12.5

## 2022-06-26 MED ORDER — ONDANSETRON HCL 4 MG/2ML IJ SOLN
4.0000 mg | Freq: Once | INTRAMUSCULAR | Status: AC
Start: 2022-06-26 — End: 2022-06-26
  Administered 2022-06-26: 4 mg via INTRAVENOUS
  Filled 2022-06-26: qty 2

## 2022-06-26 MED ORDER — POTASSIUM CHLORIDE IN NACL 20-0.9 MEQ/L-% IV SOLN
INTRAVENOUS | Status: DC
  Filled 2022-06-26 (×3): qty 1000

## 2022-06-26 MED ORDER — POTASSIUM CHLORIDE CRYS ER 20 MEQ PO TBCR
40.0000 meq | EXTENDED_RELEASE_TABLET | Freq: Once | ORAL | Status: AC
Start: 2022-06-26 — End: 2022-06-26
  Administered 2022-06-26: 40 meq via ORAL
  Filled 2022-06-26: qty 2

## 2022-06-26 MED ORDER — MORPHINE SULFATE (PF) 2 MG/ML IV SOLN
2.0000 mg | INTRAVENOUS | Status: DC | PRN
  Administered 2022-06-26 (×2): 2 mg via INTRAVENOUS
  Filled 2022-06-26 (×2): qty 1

## 2022-06-26 MED ORDER — VANCOMYCIN HCL IN DEXTROSE 1-5 GM/200ML-% IV SOLN: 1000.0000 mg | Freq: Once | INTRAVENOUS | Status: DC

## 2022-06-26 MED ORDER — ONDANSETRON HCL 4 MG/2ML IJ SOLN
4.0000 mg | Freq: Four times a day (QID) | INTRAMUSCULAR | Status: DC | PRN
  Administered 2022-06-26 – 2022-06-27 (×2): 4 mg via INTRAVENOUS
  Filled 2022-06-26 (×2): qty 2

## 2022-06-26 MED ORDER — IOHEXOL 300 MG/ML  SOLN
100.0000 mL | Freq: Once | INTRAMUSCULAR | Status: AC | PRN
  Administered 2022-06-26: 100 mL via INTRAVENOUS

## 2022-06-26 MED ORDER — METRONIDAZOLE 500 MG/100ML IV SOLN
500.0000 mg | Freq: Once | INTRAVENOUS | Status: AC
Start: 2022-06-26 — End: 2022-06-26
  Administered 2022-06-26: 500 mg via INTRAVENOUS
  Filled 2022-06-26 (×2): qty 100

## 2022-06-26 MED ORDER — INSULIN GLARGINE-YFGN 100 UNIT/ML ~~LOC~~ SOLN
20.0000 [IU] | Freq: Every day | SUBCUTANEOUS | Status: DC
  Administered 2022-06-26 – 2022-06-28 (×3): 20 [IU] via SUBCUTANEOUS
  Filled 2022-06-26 (×4): qty 0.2

## 2022-06-26 NOTE — H&P (Signed)
History and Physical    KASSY MCENROE GLO:[id number] DOB: January 29, 1986 DOA: 06/26/2022  PCP: [facility]  Patient coming from: Home  I have personally briefly reviewed patient's old medical records in [facility] Link  Chief Complaint: Right groin pain  HPI: Evelyn [name] is a 36 y.o. female with medical history significant of type 2 diabetes mellitus presented to ED with the complaint of right inguinal pain.  According to patient, she started having her periods this Saturday about 3 days ago and right then she also started having right inguinal pain which has been 5-7 out of 10, crampy, intermittent with no aggravating or relieving factor.  She also says that she sometimes feels pain in the right flank as well but that is not related to inguinal pain.  She has some nausea starting this morning.  No vomiting.  Denies any burning urination, increased or decreased frequency of urination, diarrhea or abdominal pain or constipation, any chest pain, shortness of breath, cough, fever, chills, sweating, any recent travel or any sick contact.  No previous history of such symptoms as well.  ED Course: Upon arrival to ED, she was tachycardic but SPO2 and blood pressure was stable.  She had leukocytosis.  And hypokalemia.  Blood sugar was 260.  Patient received 2 L of IV fluid bolus.  Due to abdominal pain, CT abdomen was done which showed possible pelvic congestion syndrome but ultrasound pelvis was negative.  Basically no acute pathology was found for her leukocytosis and tachycardia.  Lactic acid is pending at the moment.  COVID-pending UA also unremarkable.  Chest x-ray pending.  Review of Systems: As per HPI otherwise negative.    Past Medical History:  Diagnosis Date   Diabetes mellitus without complication (HCC)    Diagnosed in 2009; on insulin   Heartburn in pregnancy    History of trichomoniasis    Infection    UTI   Migraine    otc med prn    Past Surgical History:   Procedure Laterality Date   CESAREAN SECTION  2009   CESAREAN SECTION N/A 08/04/2013   Procedure: REPEAT CESAREAN SECTION;  Surgeon: Oliver Pila, MD;  Location: [facility];  Service: Obstetrics;  Laterality: N/A;   DENTAL SURGERY     wisdom teeth ext     reports that she has been smoking cigarettes. She has a 3.00 pack-year smoking history. She has never used smokeless tobacco. She reports current alcohol use. She reports that she does not use drugs.  Allergies  Allergen Reactions   Morphine And Related Itching    Family History  Problem Relation Age of Onset   Diabetes Mother    Hypertension Mother    Diabetes Father    Birth defects Sister        heart was on wrong side   Cancer Maternal Grandmother    Alzheimer's disease Maternal Grandfather    Cancer Paternal Grandmother     Prior to Admission medications   Medication Sig Start Date End Date Taking? Authorizing Provider  ibuprofen (ADVIL) 200 MG tablet Take 200 mg by mouth every 6 (six) hours as needed for headache.   Yes [provider]  insulin glargine (LANTUS) 100 UNIT/ML injection Inject 0.2 mLs (20 Units total) into the skin 2 (two) times daily. Patient taking differently: Inject 20-60 Units into the skin 2 (two) times daily. Pt stated she doesn't have a specific sliding scale. 11/20/21  Yes Hollice Espy, MD  insulin lispro (  HUMALOG KWIKPEN) 100 UNIT/ML KwikPen Inject 5-8 Units into the skin 3 (three) times daily. Pt stated she doesn't have a specific sliding scale.   Yes [provider]  insulin lispro (HUMALOG) 100 UNIT/ML injection Inject 0.1 mLs (10 Units total) into the skin 3 (three) times daily before meals. Patient not taking: Reported on 06/26/2022 11/20/21   Hollice Espy, MD    Physical Exam: Vitals:   06/26/22 1100 06/26/22 1200 06/26/22 1230 06/26/22 1325  BP: 109/65 106/65 102/64   Pulse: (!) 114 (!) 115 (!) 110   Resp: 18 16 18    Temp:    99.6 F (37.6 C)  TempSrc:     Oral  SpO2: 92% 92% 96%   Weight:      Height:        Constitutional: NAD, calm, comfortable Vitals:   06/26/22 1100 06/26/22 1200 06/26/22 1230 06/26/22 1325  BP: 109/65 106/65 102/64   Pulse: (!) 114 (!) 115 (!) 110   Resp: 18 16 18    Temp:    99.6 F (37.6 C)  TempSrc:    Oral  SpO2: 92% 92% 96%   Weight:      Height:       Eyes: PERRL, lids and conjunctivae normal ENMT: Mucous membranes are moist. Posterior pharynx clear of any exudate or lesions.Normal dentition.  Neck: normal, supple, no masses, no thyromegaly Respiratory: clear to auscultation bilaterally, no wheezing, no crackles. Normal respiratory effort. No accessory muscle use.  Cardiovascular: Regular rate and rhythm, no murmurs / rubs / gallops. No extremity edema. 2+ pedal pulses. No carotid bruits.  Abdomen: no tenderness, no masses palpated. No hepatosplenomegaly. Bowel sounds positive.  Musculoskeletal: no clubbing / cyanosis. No joint deformity upper and lower extremities. Good ROM, no contractures. Normal muscle tone.  Skin: no rashes, lesions, ulcers. No induration Neurologic: CN 2-12 grossly intact. Sensation intact, DTR normal. Strength 5/5 in all 4.  Psychiatric: Normal judgment and insight. Alert and oriented x 3. Normal mood.    Labs on Admission: I have personally reviewed following labs and imaging studies  CBC: Recent Labs  Lab 06/26/22 0956  WBC 22.5*  NEUTROABS 18.6*  HGB 12.2  HCT 35.3*  MCV 87.4  PLT 375   Basic Metabolic Panel: Recent Labs  Lab 06/26/22 0956  NA 137  K 3.4*  CL 105  CO2 21*  GLUCOSE 260*  BUN 13  CREATININE 0.44  CALCIUM 8.9   GFR: Estimated Creatinine Clearance: 92.4 mL/min (by C-G formula based on SCr of 0.44 mg/dL). Liver Function Tests: Recent Labs  Lab 06/26/22 0956  AST 13*  ALT 13  ALKPHOS 58  BILITOT 1.2  PROT 7.4  ALBUMIN 3.6   No results for input(s): "LIPASE", "AMYLASE" in the last 168 hours. No results for input(s): "AMMONIA" in the  last 168 hours. Coagulation Profile: No results for input(s): "INR", "PROTIME" in the last 168 hours. Cardiac Enzymes: No results for input(s): "CKTOTAL", "CKMB", "CKMBINDEX", "TROPONINI" in the last 168 hours. BNP (last 3 results) No results for input(s): "PROBNP" in the last 8760 hours. HbA1C: No results for input(s): "HGBA1C" in the last 72 hours. CBG: No results for input(s): "GLUCAP" in the last 168 hours. Lipid Profile: No results for input(s): "CHOL", "HDL", "LDLCALC", "TRIG", "CHOLHDL", "LDLDIRECT" in the last 72 hours. Thyroid Function Tests: No results for input(s): "TSH", "T4TOTAL", "FREET4", "T3FREE", "THYROIDAB" in the last 72 hours. Anemia Panel: No results for input(s): "VITAMINB12", "FOLATE", "FERRITIN", "TIBC", "IRON", "RETICCTPCT" in the last 72  hours. Urine analysis:    Component Value Date/Time   COLORURINE YELLOW 06/26/2022 1217   APPEARANCEUR CLEAR 06/26/2022 1217   LABSPEC 1.036 (H) 06/26/2022 1217   PHURINE 5.0 06/26/2022 1217   GLUCOSEU >=500 (A) 06/26/2022 1217   HGBUR LARGE (A) 06/26/2022 1217   BILIRUBINUR NEGATIVE 06/26/2022 1217   KETONESUR 80 (A) 06/26/2022 1217   PROTEINUR NEGATIVE 06/26/2022 1217   UROBILINOGEN 1.0 05/17/2014 2000   NITRITE NEGATIVE 06/26/2022 1217   LEUKOCYTESUR NEGATIVE 06/26/2022 1217    Radiological Exams on Admission: CT ABDOMEN PELVIS W CONTRAST  Result Date: 06/26/2022 CLINICAL DATA:  Acute nonlocalized abdominal pain, severe RIGHT-sided pelvic pain and heavy bleeding the normal, pain progressively worsening, associated nausea; history diabetes mellitus, smoker EXAM: CT ABDOMEN AND PELVIS WITH CONTRAST TECHNIQUE: Multidetector CT imaging of the abdomen and pelvis was performed using the standard protocol following bolus administration of intravenous contrast. RADIATION DOSE REDUCTION: This exam was performed according to the departmental dose-optimization program which includes automated exposure control, adjustment of the  mA and/or kV according to patient size and/or use of iterative reconstruction technique. CONTRAST:  OMNIPAQUE IOHEXOL 300 MG/ML SOLN IV. No oral contrast. COMPARISON:  07/01/2019 FINDINGS: Lower chest: Lung bases clear Hepatobiliary: Gallbladder and liver normal appearance Pancreas: Normal appearance Spleen: Normal appearance Adrenals/Urinary Tract: Adrenal glands, kidneys, ureters, and bladder normal appearance Stomach/Bowel: Normal appendix. Stomach and bowel loops normal appearance Vascular/Lymphatic: Aorta normal caliber. Vascular structures patent. Numerous prominent vessels in the adnexa, nonspecific but can be seen with pelvic congestion syndrome. No adenopathy. Reproductive: Unremarkable uterus and ovaries Other: No free air or free fluid. No hernia or inflammatory process. 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 Osseous structures otherwise unremarkable. 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 Numerous prominent vessels in the adnexa, nonspecific but can be seen with pelvic congestion syndrome. Otherwise negative exam. Electronically Signed   By: Ulyses Southward M.D.   On: 06/26/2022 13:15   US Pelvis Complete  Result Date: 06/26/2022 CLINICAL DATA:  Right lower quadrant pain. EXAM: TRANSABDOMINAL AND TRANSVAGINAL ULTRASOUND OF PELVIS DOPPLER ULTRASOUND OF OVARIES TECHNIQUE: Both transabdominal and transvaginal ultrasound examinations of the pelvis were performed. Transabdominal technique was performed for global imaging of the pelvis including uterus, ovaries, adnexal regions, and pelvic cul-de-sac. It was necessary to proceed with endovaginal exam following the transabdominal exam to visualize the uterus, endometrium, ovaries, and adnexae. Color and duplex Doppler ultrasound was utilized to evaluate blood flow to the ovaries. COMPARISON:  ultrasound pelvis 02/15/2020 FINDINGS: Uterus The uterus is anteverted. Measurements:  7.8 x 4.4 x 5.3 cm = volume: 95 mL. No fibroids or other mass visualized. Endometrium Thickness: 4 mm, within normal limits. Mild free fluid is seen within the endometrial canal, measuring up to 2 mm in AP dimension Right ovary Measurements: 4.2 x 2.1 x 2.0 cm = volume: 9 mL. Normal appearance/no adnexal mass. Left ovary Measurements: 2.8 x 2.7 x 2.5 cm = volume: 10 mL. Normal appearance/no adnexal mass. Pulsed Doppler evaluation of both ovaries demonstrates normal low-resistance arterial and venous waveforms. Other findings No abnormal free fluid. IMPRESSION: 1. Normal thickness of the endometrium. Mild free fluid within the endometrial canal. 2. Normal appearance of the bilateral ovaries. Electronically Signed   By: Neita Garnet M.D.   On: 06/26/2022 11:13   US Transvaginal Non-OB  Result Date: 06/26/2022 CLINICAL DATA:  Right lower quadrant pain. EXAM: TRANSABDOMINAL AND TRANSVAGINAL ULTRASOUND OF PELVIS DOPPLER ULTRASOUND OF OVARIES TECHNIQUE: Both transabdominal and transvaginal ultrasound examinations  of the pelvis were performed. Transabdominal technique was performed for global imaging of the pelvis including uterus, ovaries, adnexal regions, and pelvic cul-de-sac. It was necessary to proceed with endovaginal exam following the transabdominal exam to visualize the uterus, endometrium, ovaries, and adnexae. Color and duplex Doppler ultrasound was utilized to evaluate blood flow to the ovaries. COMPARISON:  ultrasound pelvis 02/15/2020 FINDINGS: Uterus The uterus is anteverted. Measurements: 7.8 x 4.4 x 5.3 cm = volume: 95 mL. No fibroids or other mass visualized. Endometrium Thickness: 4 mm, within normal limits. Mild free fluid is seen within the endometrial canal, measuring up to 2 mm in AP dimension Right ovary Measurements: 4.2 x 2.1 x 2.0 cm = volume: 9 mL. Normal appearance/no adnexal mass. Left ovary Measurements: 2.8 x 2.7 x 2.5 cm = volume: 10 mL. Normal appearance/no adnexal mass. Pulsed Doppler  evaluation of both ovaries demonstrates normal low-resistance arterial and venous waveforms. Other findings No abnormal free fluid. IMPRESSION: 1. Normal thickness of the endometrium. Mild free fluid within the endometrial canal. 2. Normal appearance of the bilateral ovaries. Electronically Signed   By: Neita Garnetonald  Viola M.D.   On: 06/26/2022 11:13   US Art/Ven Flow Abd Pelv Doppler  Result Date: 06/26/2022 CLINICAL DATA:  RIGHT lower quadrant pain. Evaluate for ovarian torsion. EXAM: TRANSABDOMINAL AND TRANSVAGINAL ULTRASOUND OF PELVIS DOPPLER ULTRASOUND OF OVARIES TECHNIQUE: Both transabdominal and transvaginal ultrasound examinations of the pelvis were performed. Transabdominal technique was performed for global imaging of the pelvis including uterus, ovaries, adnexal regions, and pelvic cul-de-sac. It was necessary to proceed with endovaginal exam following the transabdominal exam to visualize the endometrium and ovaries. Color and duplex Doppler ultrasound was utilized to evaluate blood flow to the ovaries. COMPARISON:  None Available. FINDINGS: Uterus Measurements: Anteverted uterus measures 7.8 x 7.4 x 5.3 cm = volume: 95 mL. Myometrium is normal echogenicity. No mass lesion. Endometrium Thickness: Endometrium is normal thickness at 3.7 mm. Small amount of fluid is seen within the endometrial canal. Right ovary Measurements: 4.2 x 2.1 x 2.0 cm = volume: 9.1 mL. Multiple small follicles. Left ovary Measurements: 2.8 x 2.72.5 cm = volume: 9.7 mL. Multiple small follicles. Pulsed Doppler evaluation of both ovaries demonstrates normal low-resistance arterial and venous waveforms. Other findings No abnormal free fluid. IMPRESSION: Normal ovaries with small follicles. Normal size with normal arterial and venous vascular waveforms. Normal uterus and endometrium. Electronically Signed   By: Genevive BiStewart  Edmunds M.D.   On: 06/26/2022 11:08    EKG: Independently reviewed.  Sinus tachycardia.  Assessment/Plan Principal  Problem:   Hypokalemia Active Problems:   Type 2 diabetes mellitus (HCC)   Overweight (BMI 25.0-29.9)   Sinus tachycardia   Right inguinal pain/right flank pain: CT and ultrasound pelvis are negative.  No source of pain is identified.  Will monitor with pain management.  SIRS/tachycardia/leukocytosis: Patient is afebrile but she has tachycardia and leukocytosis.  She may be brewing an infection which needs to be involved in order for us to see.  Lactic acid is pending.  Blood cultures have been ordered as well.  COVID is pending as well.  There is no clear source of infection yet.  I will check procalcitonin.  CT abdomen shows numerous prominent vessels and adnexa, nonspecific but can be seen with pelvic congestion syndrome.  She received 1 dose of cefepime, Flagyl and vancomycin in the ED.  We will monitor her without antibiotics for now.  We will continue IV fluids.  Uncontrolled type 2 diabetes mellitus with hyperglycemia: She  says that she takes Lantus at home but she ran out of that medication about 2 weeks ago and she has not taken it and has not been checking her blood sugar as well.  Surprisingly, her blood sugar was only 260 here.  She is not in DKA.  I will start her on Lantus 20 units and SSI.  Hypokalemia: 3.4, will replace.  DVT prophylaxis: enoxaparin (LOVENOX) injection 40 mg Start: 06/26/22 2200 Code Status: Full code Family Communication: None present at bedside.  Plan of care discussed with patient in length and he verbalized understanding and agreed with it. Disposition Plan: Potential discharge in next 1 to 2 days. Consults called: None  Hughie Closs MD [facility]  *Please note that this is a verbal dictation therefore any spelling or grammatical errors are due to the "[employer] One" system interpretation.  Please page via Amion and do not message via secure chat for urgent patient care matters. Secure chat can be used for non urgent patient care  matters. 06/26/2022, 3:35 PM  To contact the attending provider between 7A-7P or the covering provider during after hours 7P-7A, please log into the web site [email or web address]

## 2022-06-26 NOTE — Sepsis Progress Note (Signed)
Sepsis protocol monitored by eLink 

## 2022-06-26 NOTE — Progress Notes (Signed)
   06/26/22 1651  Assess: MEWS Score  Temp 99.6 F (37.6 C)  BP 124/76  MAP (mmHg) 85  Pulse Rate (!) 129  Resp 16  Level of Consciousness Alert  SpO2 100 %  O2 Device Room Air  Assess: MEWS Score  MEWS Temp 0  MEWS Systolic 0  MEWS Pulse 2  MEWS RR 0  MEWS LOC 0  MEWS Score 2  MEWS Score Color Yellow  Treat  Pain Scale 0-10  Pain Score 8  Pain Type Acute pain  Pain Location Groin  Pain Orientation Right;Lower  Pain Descriptors / Indicators Sharp  Pain Onset On-going  Patients Stated Pain Goal 4  Pain Intervention(s) MD notified (Comment)  Multiple Pain Sites No  Complains of Nausea /  Vomiting  Nausea relieved by Nothing;Other (Comment) (refractory to Zofran, MD notified)  Take Vital Signs  Increase Vital Sign Frequency  Yellow: Q 2hr X 2 then Q 4hr X 2, if remains yellow, continue Q 4hrs  Escalate  MEWS: Escalate Yellow: discuss with charge nurse/RN and consider discussing with provider and RRT  Notify: Charge Nurse/RN  Name of Charge Nurse/RN Notified Delford Field, RN  Date Charge Nurse/RN Notified 06/26/22  Time Charge Nurse/RN Notified 1654  Notify: Provider  Provider Name/Title Hughie Closs, MD  Date Provider Notified 06/26/22  Time Provider Notified 1655  Method of Notification  (secure chat)  Notification Reason Change in status  Provider response See new orders  Date of Provider Response 06/26/22  Time of Provider Response 1656  Assess: SIRS CRITERIA  SIRS Temperature  0  SIRS Pulse 1  SIRS Respirations  0  SIRS WBC 1  SIRS Score Sum  2   Bradd Burner, RN

## 2022-06-26 NOTE — ED Triage Notes (Signed)
Pt reports starting her period on Friday and began having severe R sided pelvic pain and heavier bleeding than normal. Pt reports pain has become progressively worse and endorses nausea. Denies vomiting and diarrhea.

## 2022-06-26 NOTE — ED Provider Notes (Signed)
[facility]-EMERGENCY DEPT Provider Note   CSN: [id number] Arrival date & time: 06/26/22  0859     History  Chief Complaint  Patient presents with   Pelvic Pain   Nausea    Evelyn [name] is a 36 y.o. female.  Patient presents with fever chills and abdominal pain some nausea.  She states she also started her period.  Patient has a history of diabetes  The history is provided by the patient and medical records. No language interpreter was used.  Abdominal Pain Pain location:  Generalized Pain quality: aching   Pain radiates to:  Does not radiate Pain severity:  Mild Onset quality:  Sudden Timing:  Constant Progression:  Waxing and waning Chronicity:  New Context: not alcohol use   Relieved by:  Nothing Associated symptoms: fever   Associated symptoms: no chest pain, no cough, no diarrhea, no fatigue and no hematuria        Home Medications Prior to Admission medications   Medication Sig Start Date End Date Taking? Authorizing Provider  glipiZIDE (GLUCOTROL) 5 MG tablet Take 1 tablet (5 mg total) by mouth 2 (two) times daily before a meal. 11/20/21 02/18/22  Hollice Espy, MD  insulin glargine (LANTUS) 100 UNIT/ML injection Inject 0.2 mLs (20 Units total) into the skin 2 (two) times daily. 11/20/21   Hollice Espy, MD  insulin lispro (HUMALOG) 100 UNIT/ML injection Inject 0.1 mLs (10 Units total) into the skin 3 (three) times daily before meals. 11/20/21   Hollice Espy, MD      Allergies    Morphine and related    Review of Systems   Review of Systems  Constitutional:  Positive for fever. Negative for appetite change and fatigue.  HENT:  Negative for congestion, ear discharge and sinus pressure.   Eyes:  Negative for discharge.  Respiratory:  Negative for cough.   Cardiovascular:  Negative for chest pain.  Gastrointestinal:  Positive for abdominal pain. Negative for diarrhea.  Genitourinary:  Negative for frequency and hematuria.   Musculoskeletal:  Negative for back pain.  Skin:  Negative for rash.  Neurological:  Negative for seizures and headaches.  Psychiatric/Behavioral:  Negative for hallucinations.     Physical Exam Updated Vital Signs BP 102/64   Pulse (!) 110   Temp 99.6 F (37.6 C) (Oral)   Resp 18   Ht 5\' 4"  (1.626 m)   Wt 65.8 kg   LMP 06/22/2022   SpO2 96%   BMI 24.89 kg/m  Physical Exam Vitals and nursing note reviewed.  Constitutional:      Appearance: She is well-developed.  HENT:     Head: Normocephalic.  Eyes:     General: No scleral icterus.    Conjunctiva/sclera: Conjunctivae normal.  Neck:     Thyroid: No thyromegaly.  Cardiovascular:     Rate and Rhythm: Normal rate and regular rhythm.     Heart sounds: No murmur heard.    No friction rub. No gallop.  Pulmonary:     Breath sounds: No stridor. No wheezing or rales.  Chest:     Chest wall: No tenderness.  Abdominal:     General: There is no distension.     Tenderness: There is abdominal tenderness. There is no rebound.  Musculoskeletal:        General: Normal range of motion.     Cervical back: Neck supple.  Lymphadenopathy:     Cervical: No cervical adenopathy.  Skin:    Findings:  No erythema or rash.  Neurological:     Mental Status: She is alert and oriented to person, place, and time.     Motor: No abnormal muscle tone.     Coordination: Coordination normal.  Psychiatric:        Behavior: Behavior normal.     ED Results / Procedures / Treatments   Labs (all labs ordered are listed, but only abnormal results are displayed) Labs Reviewed  CBC WITH DIFFERENTIAL/PLATELET - Abnormal; Notable for the following components:      Result Value   WBC 22.5 (*)    HCT 35.3 (*)    Neutro Abs 18.6 (*)    Monocytes Absolute 1.9 (*)    Abs Immature Granulocytes 0.26 (*)    All other components within normal limits  COMPREHENSIVE METABOLIC PANEL - Abnormal; Notable for the following components:   Potassium 3.4 (*)     CO2 21 (*)    Glucose, Bld 260 (*)    AST 13 (*)    All other components within normal limits  URINALYSIS, ROUTINE W REFLEX MICROSCOPIC - Abnormal; Notable for the following components:   Specific Gravity, Urine 1.036 (*)    Glucose, UA >=500 (*)    Hgb urine dipstick LARGE (*)    Ketones, ur 80 (*)    All other components within normal limits  RESP PANEL BY RT-PCR (FLU A&B, COVID) ARPGX2  CULTURE, BLOOD (ROUTINE X 2)  CULTURE, BLOOD (ROUTINE X 2)  URINE CULTURE  LACTIC ACID, PLASMA  LACTIC ACID, PLASMA  PROTIME-INR  APTT  I-STAT BETA HCG BLOOD, ED ([facility], [facility], [facility] ONLY)    EKG None  Radiology CT ABDOMEN PELVIS W CONTRAST  Result Date: 06/26/2022 CLINICAL DATA:  Acute nonlocalized abdominal pain, severe RIGHT-sided pelvic pain and heavy bleeding the normal, pain progressively worsening, associated nausea; history diabetes mellitus, smoker EXAM: CT ABDOMEN AND PELVIS WITH CONTRAST TECHNIQUE: Multidetector CT imaging of the abdomen and pelvis was performed using the standard protocol following bolus administration of intravenous contrast. RADIATION DOSE REDUCTION: This exam was performed according to the departmental dose-optimization program which includes automated exposure control, adjustment of the mA and/or kV according to patient size and/or use of iterative reconstruction technique. CONTRAST:  OMNIPAQUE IOHEXOL 300 MG/ML SOLN IV. No oral contrast. COMPARISON:  07/01/2019 FINDINGS: Lower chest: Lung bases clear Hepatobiliary: Gallbladder and liver normal appearance Pancreas: Normal appearance Spleen: Normal appearance Adrenals/Urinary Tract: Adrenal glands, kidneys, ureters, and bladder normal appearance Stomach/Bowel: Normal appendix. Stomach and bowel loops normal appearance Vascular/Lymphatic: Aorta normal caliber. Vascular structures patent. Numerous prominent vessels in the adnexa, nonspecific but can be seen with pelvic congestion syndrome. No adenopathy. Reproductive:  Unremarkable uterus and ovaries Other: No free air or free fluid. No hernia or inflammatory process. 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 Osseous structures otherwise unremarkable. 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 Numerous prominent vessels in the adnexa, nonspecific but can be seen with pelvic congestion syndrome. Otherwise negative exam. Electronically Signed   By: Ulyses Southward M.D.   On: 06/26/2022 13:15   US Pelvis Complete  Result Date: 06/26/2022 CLINICAL DATA:  Right lower quadrant pain. EXAM: TRANSABDOMINAL AND TRANSVAGINAL ULTRASOUND OF PELVIS DOPPLER ULTRASOUND OF OVARIES TECHNIQUE: Both transabdominal and transvaginal ultrasound examinations of the pelvis were performed. Transabdominal technique was performed for global imaging of the pelvis including uterus, ovaries, adnexal regions, and pelvic cul-de-sac. It was necessary to proceed with endovaginal exam following the transabdominal exam to visualize the uterus,  endometrium, ovaries, and adnexae. Color and duplex Doppler ultrasound was utilized to evaluate blood flow to the ovaries. COMPARISON:  ultrasound pelvis 02/15/2020 FINDINGS: Uterus The uterus is anteverted. Measurements: 7.8 x 4.4 x 5.3 cm = volume: 95 mL. No fibroids or other mass visualized. Endometrium Thickness: 4 mm, within normal limits. Mild free fluid is seen within the endometrial canal, measuring up to 2 mm in AP dimension Right ovary Measurements: 4.2 x 2.1 x 2.0 cm = volume: 9 mL. Normal appearance/no adnexal mass. Left ovary Measurements: 2.8 x 2.7 x 2.5 cm = volume: 10 mL. Normal appearance/no adnexal mass. Pulsed Doppler evaluation of both ovaries demonstrates normal low-resistance arterial and venous waveforms. Other findings No abnormal free fluid. IMPRESSION: 1. Normal thickness of the endometrium. Mild free fluid within the endometrial canal. 2. Normal appearance of the bilateral  ovaries. Electronically Signed   By: Neita Garnet M.D.   On: 06/26/2022 11:13   US Transvaginal Non-OB  Result Date: 06/26/2022 CLINICAL DATA:  Right lower quadrant pain. EXAM: TRANSABDOMINAL AND TRANSVAGINAL ULTRASOUND OF PELVIS DOPPLER ULTRASOUND OF OVARIES TECHNIQUE: Both transabdominal and transvaginal ultrasound examinations of the pelvis were performed. Transabdominal technique was performed for global imaging of the pelvis including uterus, ovaries, adnexal regions, and pelvic cul-de-sac. It was necessary to proceed with endovaginal exam following the transabdominal exam to visualize the uterus, endometrium, ovaries, and adnexae. Color and duplex Doppler ultrasound was utilized to evaluate blood flow to the ovaries. COMPARISON:  ultrasound pelvis 02/15/2020 FINDINGS: Uterus The uterus is anteverted. Measurements: 7.8 x 4.4 x 5.3 cm = volume: 95 mL. No fibroids or other mass visualized. Endometrium Thickness: 4 mm, within normal limits. Mild free fluid is seen within the endometrial canal, measuring up to 2 mm in AP dimension Right ovary Measurements: 4.2 x 2.1 x 2.0 cm = volume: 9 mL. Normal appearance/no adnexal mass. Left ovary Measurements: 2.8 x 2.7 x 2.5 cm = volume: 10 mL. Normal appearance/no adnexal mass. Pulsed Doppler evaluation of both ovaries demonstrates normal low-resistance arterial and venous waveforms. Other findings No abnormal free fluid. IMPRESSION: 1. Normal thickness of the endometrium. Mild free fluid within the endometrial canal. 2. Normal appearance of the bilateral ovaries. Electronically Signed   By: Neita Garnet M.D.   On: 06/26/2022 11:13   [place] Art/Ven Flow Abd Pelv Doppler  Result Date: 06/26/2022 CLINICAL DATA:  RIGHT lower quadrant pain. Evaluate for ovarian torsion. EXAM: TRANSABDOMINAL AND TRANSVAGINAL ULTRASOUND OF PELVIS DOPPLER ULTRASOUND OF OVARIES TECHNIQUE: Both transabdominal and transvaginal ultrasound examinations of the pelvis were performed. Transabdominal  technique was performed for global imaging of the pelvis including uterus, ovaries, adnexal regions, and pelvic cul-de-sac. It was necessary to proceed with endovaginal exam following the transabdominal exam to visualize the endometrium and ovaries. Color and duplex Doppler ultrasound was utilized to evaluate blood flow to the ovaries. COMPARISON:  None Available. FINDINGS: Uterus Measurements: Anteverted uterus measures 7.8 x 7.4 x 5.3 cm = volume: 95 mL. Myometrium is normal echogenicity. No mass lesion. Endometrium Thickness: Endometrium is normal thickness at 3.7 mm. Small amount of fluid is seen within the endometrial canal. Right ovary Measurements: 4.2 x 2.1 x 2.0 cm = volume: 9.1 mL. Multiple small follicles. Left ovary Measurements: 2.8 x 2.72.5 cm = volume: 9.7 mL. Multiple small follicles. Pulsed Doppler evaluation of both ovaries demonstrates normal low-resistance arterial and venous waveforms. Other findings No abnormal free fluid. IMPRESSION: Normal ovaries with small follicles. Normal size with normal arterial and venous vascular waveforms. Normal uterus and  endometrium. Electronically Signed   By: Genevive Bi M.D.   On: 06/26/2022 11:08    Procedures Procedures    Medications Ordered in ED Medications  ondansetron (ZOFRAN) injection 4 mg (has no administration in time range)  ceFEPIme (MAXIPIME) 2 g in sodium chloride 0.9 % 100 mL IVPB (has no administration in time range)  metroNIDAZOLE (FLAGYL) IVPB 500 mg (has no administration in time range)  vancomycin (VANCOREADY) IVPB 1500 mg/300 mL (has no administration in time range)  ondansetron (ZOFRAN) injection 4 mg (4 mg Intravenous Given 06/26/22 1007)  sodium chloride 0.9 % bolus 1,000 mL (0 mLs Intravenous Stopped 06/26/22 1230)  HYDROmorphone (DILAUDID) injection 0.5 mg (0.5 mg Intravenous Given 06/26/22 1008)  iohexol (OMNIPAQUE) 300 MG/ML solution 100 mL (100 mLs Intravenous Contrast Given 06/26/22 1258)  sodium chloride 0.9 % bolus  1,000 mL (1,000 mLs Intravenous New Bag/Given 06/26/22 1459)    ED Course/ Medical Decision Making/ A&P                           Medical Decision Making Amount and/or Complexity of Data Reviewed Labs: ordered. Radiology: ordered. ECG/medicine tests: ordered.  Risk Prescription drug management. Decision regarding hospitalization.  This patient presents to the ED for concern of fevers chills abdominal pain, this involves an extensive number of treatment options, and is a complaint that carries with it a high risk of complications and morbidity.  The differential diagnosis includes diverticulitis, appendicitis   Co morbidities that complicate the patient evaluation  Diabetes   Additional history obtained:  Additional history obtained from patient External records from outside source obtained and reviewed including hospital records   Lab Tests:  I Ordered, and personally interpreted labs.  The pertinent results include: White blood count 22,000   Imaging Studies ordered:  I ordered imaging studies including ultrasound of pelvis and CT abdomen I independently visualized and interpreted imaging which showed no acute disease I agree with the radiologist interpretation   Cardiac Monitoring: / EKG:  The patient was maintained on a cardiac monitor.  I personally viewed and interpreted the cardiac monitored which showed an underlying rhythm of: Normal sinus rhythm   Consultations Obtained:  I requested consultation with the hospitalist,  and discussed lab and imaging findings as well as pertinent plan - they recommend: Admit   Problem List / ED Course / Critical interventions / Medication management  Diabetes and fever and chills I ordered medication including antibiotics for possible sepsis Reevaluation of the patient after these medicines showed that the patient stayed the same I have reviewed the patients home medicines and have made adjustments as needed   Social  Determinants of Health:  None   Test / Admission - Considered:  No additional test needed  Patient with fever and leukocytosis unknown origin.  She will have blood cultures done and be admitted to medicine        Final Clinical Impression(s) / ED Diagnoses Final diagnoses:  Nausea    Rx / DC Orders ED Discharge Orders     None         Bethann Berkshire, MD 06/29/22 1005

## 2022-06-26 NOTE — Progress Notes (Signed)
A consult was received from an ED physician for Vancomycin and Cefepime per pharmacy dosing.  The patient's profile has been reviewed for ht/wt/allergies/indication/available labs.    A one time order has been placed for Vancomycin 1500mg  IV and Cefepime 2g IV.  Further antibiotics/pharmacy consults should be ordered by admitting physician if indicated.                       Thank you, 06/26/2022  2:51 PM

## 2022-06-27 DIAGNOSIS — L0390 Cellulitis, unspecified: Secondary | ICD-10-CM | POA: Diagnosis present

## 2022-06-27 LAB — CBC
HCT: 33 % — ABNORMAL LOW (ref 36.0–46.0)
Hemoglobin: 10.7 g/dL — ABNORMAL LOW (ref 12.0–15.0)
MCH: 29.4 pg (ref 26.0–34.0)
MCHC: 32.4 g/dL (ref 30.0–36.0)
MCV: 90.7 fL (ref 80.0–100.0)
Platelets: 363 10*3/uL (ref 150–400)
RBC: 3.64 MIL/uL — ABNORMAL LOW (ref 3.87–5.11)
RDW: 12.5 % (ref 11.5–15.5)
WBC: 19.8 10*3/uL — ABNORMAL HIGH (ref 4.0–10.5)
nRBC: 0 % (ref 0.0–0.2)

## 2022-06-27 LAB — COMPREHENSIVE METABOLIC PANEL
ALT: 10 U/L (ref 0–44)
AST: 10 U/L — ABNORMAL LOW (ref 15–41)
Albumin: 3.3 g/dL — ABNORMAL LOW (ref 3.5–5.0)
Alkaline Phosphatase: 51 U/L (ref 38–126)
Anion gap: 8 (ref 5–15)
BUN: 8 mg/dL (ref 6–20)
CO2: 18 mmol/L — ABNORMAL LOW (ref 22–32)
Calcium: 8 mg/dL — ABNORMAL LOW (ref 8.9–10.3)
Chloride: 110 mmol/L (ref 98–111)
Creatinine, Ser: 0.35 mg/dL — ABNORMAL LOW (ref 0.44–1.00)
GFR, Estimated: >60 mL/min (ref >60)
Glucose, Bld: 189 mg/dL — ABNORMAL HIGH (ref 70–99)
Potassium: 3.9 mmol/L (ref 3.5–5.1)
Sodium: 136 mmol/L (ref 135–145)
Total Bilirubin: 1.2 mg/dL (ref 0.3–1.2)
Total Protein: 6.9 g/dL (ref 6.5–8.1)

## 2022-06-27 LAB — GLUCOSE, CAPILLARY
Glucose-Capillary: 169 mg/dL — ABNORMAL HIGH (ref 70–99)
Glucose-Capillary: 164 mg/dL — ABNORMAL HIGH (ref 70–99)
Glucose-Capillary: 204 mg/dL — ABNORMAL HIGH (ref 70–99)
Glucose-Capillary: 190 mg/dL — ABNORMAL HIGH (ref 70–99)
Glucose-Capillary: 188 mg/dL — ABNORMAL HIGH (ref 70–99)

## 2022-06-27 LAB — MAGNESIUM: Magnesium: 2.2 mg/dL (ref 1.7–2.4)

## 2022-06-27 MED ORDER — MAGNESIUM SULFATE 2 GM/50ML IV SOLN: 2.0000 g | Freq: Once | INTRAVENOUS | Status: DC

## 2022-06-27 MED ORDER — GLUCERNA SHAKE PO LIQD
237.0000 mL | Freq: Three times a day (TID) | ORAL | Status: DC
  Administered 2022-06-27 (×2): 237 mL via ORAL
  Filled 2022-06-27 (×4): qty 237

## 2022-06-27 MED ORDER — SULFAMETHOXAZOLE-TRIMETHOPRIM 800-160 MG PO TABS
1.0000 | ORAL_TABLET | Freq: Two times a day (BID) | ORAL | Status: DC
Start: 2022-06-27 — End: 2022-06-28
  Administered 2022-06-27 – 2022-06-28 (×3): 1 via ORAL
  Filled 2022-06-27 (×3): qty 1

## 2022-06-27 MED ORDER — IBUPROFEN 200 MG PO TABS
400.0000 mg | ORAL_TABLET | Freq: Four times a day (QID) | ORAL | Status: DC | PRN
  Administered 2022-06-27 (×2): 400 mg via ORAL
  Filled 2022-06-27 (×2): qty 2

## 2022-06-27 NOTE — Progress Notes (Signed)
PROGRESS NOTE    Evelyn [name]  [id number] DOB: 1986/08/08 DOA: 06/26/2022 PCP: [facility], [facility]    Brief Narrative:  Evelyn [name] is a 36 y.o. female with medical history significant of type 2 diabetes mellitus presented to ED with the complaint of right inguinal pain.  Found to have wound on right buttock.    Assessment and Plan: Right buttock cellulitis/abscess -appears to be draining -warm compress -abx -labs in AM -if does not drain may need GS consult for I/D  Uncontrolled type 2 diabetes mellitus with hyperglycemia: She says that she takes Lantus at home but she ran out of that medication about 2 weeks ago and she has not taken it and has not been checking her blood sugar as well.  Surprisingly, her blood sugar was only 260 here.  She is not in DKA.  I will start her on Lantus 20 units and SSI.   Hypokalemia: -replete along with Mg.  DM -SSI -lantus  DVT prophylaxis: enoxaparin (LOVENOX) injection 40 mg Start: 06/26/22 2200    Code Status: Full Code Family Communication:   Disposition Plan:  Level of care: Med-Surg Status is: Inpatient Remains inpatient appropriate because: needs abx and possible I/D    Consultants:  none   Subjective: Pain on right buttock  Objective: Vitals:   06/27/22 0030 06/27/22 0422 06/27/22 0500 06/27/22 0728  BP: 95/66 92/65  114/73  Pulse: (!) 108 (!) 103  (!) 107  Resp: 18 18  14   Temp: 99.8 F (37.7 C) 98.2 F (36.8 C)  97.7 F (36.5 C)  TempSrc: Oral Oral  Oral  SpO2: 98% 98%  100%  Weight:   67.7 kg   Height:        Intake/Output Summary (Last 24 hours) at 06/27/2022 1242 Last data filed at 06/27/2022 0500 Gross per 24 hour  Intake 2344.03 ml  Output 1801 ml  Net 543.03 ml   Filed Weights   06/26/22 0903 06/27/22 0500  Weight: 65.8 kg 67.7 kg    Examination:   General: Appearance:     Overweight female in no acute distress     Lungs:     respirations unlabored  Heart:     Tachycardic. Normal rhythm. No murmurs, rubs, or gallops.    MS:   All extremities are intact.    Neurologic:   Awake, alert, oriented x 3. No apparent focal neurological           defect.        Data Reviewed: I have personally reviewed following labs and imaging studies  CBC: Recent Labs  Lab 06/26/22 0956 06/27/22 0412  WBC 22.5* 19.8*  NEUTROABS 18.6*  --   HGB 12.2 10.7*  HCT 35.3* 33.0*  MCV 87.4 90.7  PLT 375 [id number]   Basic Metabolic Panel: Recent Labs  Lab 06/26/22 0956 06/26/22 1655 06/27/22 0412  NA 137  --  136  K 3.4*  --  3.9  CL 105  --  110  CO2 21*  --  18*  GLUCOSE 260*  --  189*  BUN 13  --  8  CREATININE 0.44  --  0.35*  CALCIUM 8.9  --  8.0*  MG  --  1.6* 2.2   GFR: Estimated Creatinine Clearance: 93.7 mL/min (A) (by C-G formula based on SCr of 0.35 mg/dL (L)). Liver Function Tests: Recent Labs  Lab 06/26/22 0956 06/27/22 0412  AST 13* 10*  ALT 13 10  ALKPHOS 58 51  BILITOT 1.2 1.2  PROT 7.4 6.9  ALBUMIN 3.6 3.3*   No results for input(s): "LIPASE", "AMYLASE" in the last 168 hours. No results for input(s): "AMMONIA" in the last 168 hours. Coagulation Profile: Recent Labs  Lab 06/26/22 1458  INR 1.2   Cardiac Enzymes: No results for input(s): "CKTOTAL", "CKMB", "CKMBINDEX", "TROPONINI" in the last 168 hours. BNP (last 3 results) No results for input(s): "PROBNP" in the last 8760 hours. HbA1C: Recent Labs    06/26/22 1655  HGBA1C 9.7*   CBG: Recent Labs  Lab 06/26/22 1706 06/26/22 2201 06/27/22 0728 06/27/22 1144  GLUCAP 172* 210* 169* 164*   Lipid Profile: No results for input(s): "CHOL", "HDL", "LDLCALC", "TRIG", "CHOLHDL", "LDLDIRECT" in the last 72 hours. Thyroid Function Tests: Recent Labs    06/26/22 1655  TSH 0.354   Anemia Panel: No results for input(s): "VITAMINB12", "FOLATE", "FERRITIN", "TIBC", "IRON", "RETICCTPCT" in the last 72 hours. Sepsis Labs: Recent Labs  Lab 06/26/22 1436 06/26/22 1655   PROCALCITON  --  <0.10  LATICACIDVEN 0.9 1.0    Recent Results (from the past 240 hour(s))  Urine Culture     Status: None (Preliminary result)   Collection Time: 06/26/22 12:17 PM   Specimen: In/Out Cath Urine  Result Value Ref Range Status   Specimen Description   Final    IN/OUT CATH URINE Performed at [facility] [address], [place], [place] [address]    Special Requests   Final    NONE Performed at [facility], [place] [address], [place], [place] [address]    Culture   Final    CULTURE REINCUBATED FOR BETTER GROWTH Performed at [facility], [place] [address], [place], [place] [address]    Report Status PENDING  Incomplete  Blood Culture (routine x 2)     Status: None (Preliminary result)   Collection Time: 06/26/22  2:54 PM   Specimen: BLOOD  Result Value Ref Range Status   Specimen Description   Final    BLOOD BLOOD LEFT HAND Performed at [facility] [address], [place], [place] [address]    Special Requests   Final    BOTTLES DRAWN AEROBIC AND ANAEROBIC Blood Culture adequate volume Performed at [facility] [address] [place], [place] [address]    Culture   Final    NO GROWTH < 12 HOURS Performed at [facility] [address] [place], [place] [address]    Report Status PENDING  Incomplete  Resp Panel by RT-PCR (Flu A&B, Covid) Anterior Nasal Swab     Status: None   Collection Time: 06/26/22  2:57 PM   Specimen: Anterior Nasal Swab  Result Value Ref Range Status   SARS Coronavirus 2 by RT PCR NEGATIVE NEGATIVE Final    Comment: (NOTE) SARS-CoV-2 target nucleic acids are NOT DETECTED.  The SARS-CoV-2 RNA is generally detectable in upper respiratory specimens during the acute phase of infection. The lowest concentration of SARS-CoV-2 viral copies this assay can detect is 138 copies/mL. A negative result does not preclude SARS-Cov-2 infection and should not  be used as the sole basis for treatment or other patient management decisions. A negative result may occur with  improper specimen collection/handling, submission of specimen other than nasopharyngeal swab, presence of viral mutation(s) within the areas targeted by this assay, and inadequate number of viral copies(<138 copies/mL). A negative result must be combined with clinical observations, patient history, and epidemiological information.  The expected result is Negative.  Fact Sheet for Patients:  BloggerCourse.com  Fact Sheet for Healthcare Providers:  SeriousBroker.it  This test is no t yet approved or cleared by the [place] FDA and  has been authorized for detection and/or diagnosis of SARS-CoV-2 by FDA under an Emergency Use Authorization (EUA). This EUA will remain  in effect (meaning this test can be used) for the duration of the COVID-19 declaration under Section 564(b)(1) of the Act, 21 U.S.C.section 360bbb-3(b)(1), unless the authorization is terminated  or revoked sooner.       Influenza A by PCR NEGATIVE NEGATIVE Final   Influenza B by PCR NEGATIVE NEGATIVE Final    Comment: (NOTE) The Xpert Xpress SARS-CoV-2/FLU/RSV plus assay is intended as an aid in the diagnosis of influenza from Nasopharyngeal swab specimens and should not be used as a sole basis for treatment. Nasal washings and aspirates are unacceptable for Xpert Xpress SARS-CoV-2/FLU/RSV testing.  Fact Sheet for Patients: BloggerCourse.com  Fact Sheet for Healthcare Providers: SeriousBroker.it  This test is not yet approved or cleared by the [place] FDA and has been authorized for detection and/or diagnosis of SARS-CoV-2 by FDA under an Emergency Use Authorization (EUA). This EUA will remain in effect (meaning this test can be used) for the duration of the COVID-19 declaration under Section  564(b)(1) of the Act, 21 U.S.C. section 360bbb-3(b)(1), unless the authorization is terminated or revoked.  Performed at [facility], [address], [place], [place] [address]   Blood Culture (routine x 2)     Status: None (Preliminary result)   Collection Time: 06/26/22  2:58 PM   Specimen: BLOOD  Result Value Ref Range Status   Specimen Description   Final    BLOOD BLOOD RIGHT HAND Performed at [facility], [address] [place], [place] [address]    Special Requests   Final    BOTTLES DRAWN AEROBIC AND ANAEROBIC Blood Culture adequate volume Performed at [facility], [address], [place], [place] [address]    Culture   Final    NO GROWTH < 12 HOURS Performed at [facility], [address], [place], [place] [address]    Report Status PENDING  Incomplete         Radiology Studies: DG Chest Port 1 View  Result Date: 06/26/2022 CLINICAL DATA:  Sepsis. EXAM: PORTABLE CHEST 1 VIEW COMPARISON:  Chest x-ray 08/20/2018 FINDINGS: The cardiac silhouette, mediastinal and hilar contours are normal. The lungs are clear. No pleural effusions. No pulmonary lesions. No pneumothorax. IMPRESSION: No acute cardiopulmonary findings. Electronically Signed   By: Rudie Meyer M.D.   On: 06/26/2022 15:41   CT ABDOMEN PELVIS W CONTRAST  Result Date: 06/26/2022 CLINICAL DATA:  Acute nonlocalized abdominal pain, severe RIGHT-sided pelvic pain and heavy bleeding the normal, pain progressively worsening, associated nausea; history diabetes mellitus, smoker EXAM: CT ABDOMEN AND PELVIS WITH CONTRAST TECHNIQUE: Multidetector CT imaging of the abdomen and pelvis was performed using the standard protocol following bolus administration of intravenous contrast. RADIATION DOSE REDUCTION: This exam was performed according to the departmental dose-optimization program which includes automated exposure control, adjustment of the mA and/or kV  according to patient size and/or use of iterative reconstruction technique. CONTRAST:  OMNIPAQUE IOHEXOL 300 MG/ML SOLN IV. No oral contrast. COMPARISON:  07/01/2019 FINDINGS: Lower chest: Lung bases clear Hepatobiliary: Gallbladder and liver normal appearance Pancreas: Normal appearance Spleen: Normal appearance Adrenals/Urinary Tract: Adrenal glands, kidneys, ureters, and bladder  normal appearance Stomach/Bowel: Normal appendix. Stomach and bowel loops normal appearance Vascular/Lymphatic: Aorta normal caliber. Vascular structures patent. Numerous prominent vessels in the adnexa, nonspecific but can be seen with pelvic congestion syndrome. No adenopathy. Reproductive: Unremarkable uterus and ovaries Other: No free air or free fluid. No hernia or inflammatory process. 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 Osseous structures otherwise unremarkable. 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 Numerous prominent vessels in the adnexa, nonspecific but can be seen with pelvic congestion syndrome. Otherwise negative exam. Electronically Signed   By: Ulyses Southward M.D.   On: 06/26/2022 13:15   US Pelvis Complete  Result Date: 06/26/2022 CLINICAL DATA:  Right lower quadrant pain. EXAM: TRANSABDOMINAL AND TRANSVAGINAL ULTRASOUND OF PELVIS DOPPLER ULTRASOUND OF OVARIES TECHNIQUE: Both transabdominal and transvaginal ultrasound examinations of the pelvis were performed. Transabdominal technique was performed for global imaging of the pelvis including uterus, ovaries, adnexal regions, and pelvic cul-de-sac. It was necessary to proceed with endovaginal exam following the transabdominal exam to visualize the uterus, endometrium, ovaries, and adnexae. Color and duplex Doppler ultrasound was utilized to evaluate blood flow to the ovaries. COMPARISON:  ultrasound pelvis 02/15/2020 FINDINGS: Uterus The uterus is anteverted. Measurements: 7.8 x 4.4 x  5.3 cm = volume: 95 mL. No fibroids or other mass visualized. Endometrium Thickness: 4 mm, within normal limits. Mild free fluid is seen within the endometrial canal, measuring up to 2 mm in AP dimension Right ovary Measurements: 4.2 x 2.1 x 2.0 cm = volume: 9 mL. Normal appearance/no adnexal mass. Left ovary Measurements: 2.8 x 2.7 x 2.5 cm = volume: 10 mL. Normal appearance/no adnexal mass. Pulsed Doppler evaluation of both ovaries demonstrates normal low-resistance arterial and venous waveforms. Other findings No abnormal free fluid. IMPRESSION: 1. Normal thickness of the endometrium. Mild free fluid within the endometrial canal. 2. Normal appearance of the bilateral ovaries. Electronically Signed   By: Neita Garnet M.D.   On: 06/26/2022 11:13   US Transvaginal Non-OB  Result Date: 06/26/2022 CLINICAL DATA:  Right lower quadrant pain. EXAM: TRANSABDOMINAL AND TRANSVAGINAL ULTRASOUND OF PELVIS DOPPLER ULTRASOUND OF OVARIES TECHNIQUE: Both transabdominal and transvaginal ultrasound examinations of the pelvis were performed. Transabdominal technique was performed for global imaging of the pelvis including uterus, ovaries, adnexal regions, and pelvic cul-de-sac. It was necessary to proceed with endovaginal exam following the transabdominal exam to visualize the uterus, endometrium, ovaries, and adnexae. Color and duplex Doppler ultrasound was utilized to evaluate blood flow to the ovaries. COMPARISON:  ultrasound pelvis 02/15/2020 FINDINGS: Uterus The uterus is anteverted. Measurements: 7.8 x 4.4 x 5.3 cm = volume: 95 mL. No fibroids or other mass visualized. Endometrium Thickness: 4 mm, within normal limits. Mild free fluid is seen within the endometrial canal, measuring up to 2 mm in AP dimension Right ovary Measurements: 4.2 x 2.1 x 2.0 cm = volume: 9 mL. Normal appearance/no adnexal mass. Left ovary Measurements: 2.8 x 2.7 x 2.5 cm = volume: 10 mL. Normal appearance/no adnexal mass. Pulsed Doppler evaluation  of both ovaries demonstrates normal low-resistance arterial and venous waveforms. Other findings No abnormal free fluid. IMPRESSION: 1. Normal thickness of the endometrium. Mild free fluid within the endometrial canal. 2. Normal appearance of the bilateral ovaries. Electronically Signed   By: Neita Garnet M.D.   On: 06/26/2022 11:13   [place] Art/Ven Flow Abd Pelv Doppler  Result Date: 06/26/2022 CLINICAL DATA:  RIGHT lower quadrant pain. Evaluate for ovarian torsion. EXAM: TRANSABDOMINAL AND TRANSVAGINAL ULTRASOUND OF PELVIS DOPPLER ULTRASOUND  OF OVARIES TECHNIQUE: Both transabdominal and transvaginal ultrasound examinations of the pelvis were performed. Transabdominal technique was performed for global imaging of the pelvis including uterus, ovaries, adnexal regions, and pelvic cul-de-sac. It was necessary to proceed with endovaginal exam following the transabdominal exam to visualize the endometrium and ovaries. Color and duplex Doppler ultrasound was utilized to evaluate blood flow to the ovaries. COMPARISON:  None Available. FINDINGS: Uterus Measurements: Anteverted uterus measures 7.8 x 7.4 x 5.3 cm = volume: 95 mL. Myometrium is normal echogenicity. No mass lesion. Endometrium Thickness: Endometrium is normal thickness at 3.7 mm. Small amount of fluid is seen within the endometrial canal. Right ovary Measurements: 4.2 x 2.1 x 2.0 cm = volume: 9.1 mL. Multiple small follicles. Left ovary Measurements: 2.8 x 2.72.5 cm = volume: 9.7 mL. Multiple small follicles. Pulsed Doppler evaluation of both ovaries demonstrates normal low-resistance arterial and venous waveforms. Other findings No abnormal free fluid. IMPRESSION: Normal ovaries with small follicles. Normal size with normal arterial and venous vascular waveforms. Normal uterus and endometrium. Electronically Signed   By: Suzy Bouchard M.D.   On: 06/26/2022 11:08        Scheduled Meds:  enoxaparin (LOVENOX) injection  40 mg Subcutaneous QHS    feeding supplement (GLUCERNA SHAKE)  237 mL Oral TID BM   insulin aspart  0-15 Units Subcutaneous TID WC   insulin aspart  0-5 Units Subcutaneous QHS   insulin glargine-yfgn  20 Units Subcutaneous Daily   sodium chloride flush  3 mL Intravenous Q12H   sulfamethoxazole-trimethoprim  1 tablet Oral Q12H   Continuous Infusions:  magnesium sulfate bolus IVPB     promethazine (PHENERGAN) injection (IM or IVPB) 12.5 mg (06/26/22 1724)     LOS: 0 days    Time spent: 45 minutes spent on chart review, discussion with nursing staff, consultants, updating family and interview/physical exam; more than 50% of that time was spent in counseling and/or coordination of care.    Geradine Girt, DO [facility] Available via Epic secure chat 7am-7pm After these hours, please refer to coverage provider listed on [email or web address] 06/27/2022, 12:42 PM

## 2022-06-28 ENCOUNTER — Other Ambulatory Visit (HOSPITAL_COMMUNITY): Payer: Self-pay

## 2022-06-28 LAB — CBC
HCT: 32 % — ABNORMAL LOW (ref 36.0–46.0)
Hemoglobin: 10.7 g/dL — ABNORMAL LOW (ref 12.0–15.0)
MCH: 30 pg (ref 26.0–34.0)
MCHC: 33.4 g/dL (ref 30.0–36.0)
MCV: 89.6 fL (ref 80.0–100.0)
Platelets: 348 10*3/uL (ref 150–400)
RBC: 3.57 MIL/uL — ABNORMAL LOW (ref 3.87–5.11)
RDW: 12.4 % (ref 11.5–15.5)
WBC: 11.3 10*3/uL — ABNORMAL HIGH (ref 4.0–10.5)
nRBC: 0 % (ref 0.0–0.2)

## 2022-06-28 LAB — BASIC METABOLIC PANEL
Anion gap: 8 (ref 5–15)
BUN: 13 mg/dL (ref 6–20)
CO2: 23 mmol/L (ref 22–32)
Calcium: 8.7 mg/dL — ABNORMAL LOW (ref 8.9–10.3)
Chloride: 106 mmol/L (ref 98–111)
Creatinine, Ser: 0.32 mg/dL — ABNORMAL LOW (ref 0.44–1.00)
GFR, Estimated: >60 mL/min (ref >60)
Glucose, Bld: 195 mg/dL — ABNORMAL HIGH (ref 70–99)
Potassium: 3.8 mmol/L (ref 3.5–5.1)
Sodium: 137 mmol/L (ref 135–145)

## 2022-06-28 LAB — URINE CULTURE

## 2022-06-28 LAB — GLUCOSE, CAPILLARY
Glucose-Capillary: 168 mg/dL — ABNORMAL HIGH (ref 70–99)
Glucose-Capillary: 199 mg/dL — ABNORMAL HIGH (ref 70–99)

## 2022-06-28 MED ORDER — ONDANSETRON HCL 4 MG PO TABS
4.0000 mg | ORAL_TABLET | Freq: Four times a day (QID) | ORAL | 0 refills | Status: DC | PRN
  Filled 2022-06-28: qty 20, 5d supply, fill #0

## 2022-06-28 MED ORDER — INSULIN LISPRO (1 UNIT DIAL) 100 UNIT/ML (KWIKPEN): 0.0000 [IU] | PEN_INJECTOR | Freq: Three times a day (TID) | SUBCUTANEOUS | 11 refills | Status: DC

## 2022-06-28 MED ORDER — TRAMADOL HCL 50 MG PO TABS
50.0000 mg | ORAL_TABLET | Freq: Four times a day (QID) | ORAL | 0 refills | Status: DC | PRN
  Filled 2022-06-28: qty 20, 5d supply, fill #0

## 2022-06-28 MED ORDER — SULFAMETHOXAZOLE-TRIMETHOPRIM 800-160 MG PO TABS
1.0000 | ORAL_TABLET | Freq: Two times a day (BID) | ORAL | 0 refills | Status: DC
  Filled 2022-06-28: qty 14, 7d supply, fill #0

## 2022-06-28 NOTE — Progress Notes (Signed)
  Transition of Care [facility]) Screening Note   Patient Details  Name: Evelyn [name] Date of Birth: 1986-06-08   Transition of Care [facility]) CM/SW Contact:    Lavenia Atlas, RN Phone Number: 06/28/2022, 10:18 AM    Transition of Care Department [facility]) has reviewed patient and no TOC needs have been identified at this time. We will continue to monitor patient advancement through interdisciplinary progression rounds. If new patient transition needs arise, please place a TOC consult.

## 2022-06-28 NOTE — Discharge Summary (Signed)
Physician Discharge Summary  Lyndal PulleyGloria E Marsala ZOX:[id number] DOB: 08/12/1986 DOA: 06/26/2022  [facility]  Admit date: 06/26/2022 Discharge date: 06/28/2022  Admitted From: home Discharge disposition: home   Recommendations for Outpatient Follow-Up:   Follow to ensure resolution of wound Continue wound care  Better control of blood sugar   Discharge Diagnosis:   Principal Problem:   Hypokalemia Active Problems:   Type 2 diabetes mellitus (HCC)   Overweight (BMI 25.0-29.9)   Sinus tachycardia   Cellulitis    Discharge Condition: Improved.  Diet recommendation: Carbohydrate-modified.   Wound care: None.  Code status: Full.   History of Present Illness:   Lyndal PulleyGloria E Mitchelle is a 36 y.o. female with medical history significant of type 2 diabetes mellitus presented to ED with the complaint of right inguinal pain.  According to patient, she started having her periods this Saturday about 3 days ago and right then she also started having right inguinal pain which has been 5-7 out of 10, crampy, intermittent with no aggravating or relieving factor.  She also says that she sometimes feels pain in the right flank as well but that is not related to inguinal pain.  She has some nausea starting this morning.  No vomiting.  Denies any burning urination, increased or decreased frequency of urination, diarrhea or abdominal pain or constipation, any chest pain, shortness of breath, cough, fever, chills, sweating, any recent travel or any sick contact.  No previous history of such symptoms as well.   ED Course: Upon arrival to ED, she was tachycardic but SPO2 and blood pressure was stable.  She had leukocytosis.  And hypokalemia.  Blood sugar was 260.  Patient received 2 L of IV fluid bolus.  Due to abdominal pain, CT abdomen was done which showed possible pelvic congestion syndrome but ultrasound pelvis was negative.  Basically no acute pathology was found for her  leukocytosis and tachycardia.  Lactic acid is pending at the moment.  COVID-pending UA also unremarkable.  Chest x-ray pending.   Hospital Course by Problem:   Right buttock cellulitis/abscess -appears to be draining -warm compress -abx- continue for 5 more days -no sign of needing I/D-- much improved   Uncontrolled type 2 diabetes mellitus with hyperglycemia:  -resume home meds   Hypokalemia: -replete along with Mg.      Medical Consultants:      Discharge Exam:   Vitals:   06/27/22 2018 06/28/22 0513  BP: 107/67 103/69  Pulse: (!) 103 90  Resp: 16 16  Temp: 98.1 F (36.7 C) 97.6 F (36.4 C)  SpO2: 100% 100%   Vitals:   06/27/22 0728 06/27/22 1332 06/27/22 2018 06/28/22 0513  BP: 114/73 106/75 107/67 103/69  Pulse: (!) 107 (!) 108 (!) 103 90  Resp: 14 16 16 16   Temp: 97.7 F (36.5 C) (!) 97.5 F (36.4 C) 98.1 F (36.7 C) 97.6 F (36.4 C)  TempSrc: Oral Oral Oral Oral  SpO2: 100% 99% 100% 100%  Weight:      Height:        General exam: Appears calm and comfortable.  .    The results of significant diagnostics from this hospitalization (including imaging, microbiology, ancillary and laboratory) are listed below for reference.     Procedures and Diagnostic Studies:   DG Chest Port 1 View  Result Date: 06/26/2022 CLINICAL DATA:  Sepsis. EXAM: PORTABLE CHEST 1 VIEW COMPARISON:  Chest x-ray 08/20/2018 FINDINGS: The cardiac silhouette, mediastinal and  hilar contours are normal. The lungs are clear. No pleural effusions. No pulmonary lesions. No pneumothorax. IMPRESSION: No acute cardiopulmonary findings. Electronically Signed   By: Rudie Meyer M.D.   On: 06/26/2022 15:41   CT ABDOMEN PELVIS W CONTRAST  Result Date: 06/26/2022 CLINICAL DATA:  Acute nonlocalized abdominal pain, severe RIGHT-sided pelvic pain and heavy bleeding the normal, pain progressively worsening, associated nausea; history diabetes mellitus, smoker EXAM: CT ABDOMEN AND PELVIS WITH  CONTRAST TECHNIQUE: Multidetector CT imaging of the abdomen and pelvis was performed using the standard protocol following bolus administration of intravenous contrast. RADIATION DOSE REDUCTION: This exam was performed according to the departmental dose-optimization program which includes automated exposure control, adjustment of the mA and/or kV according to patient size and/or use of iterative reconstruction technique. CONTRAST:  OMNIPAQUE IOHEXOL 300 MG/ML SOLN IV. No oral contrast. COMPARISON:  07/01/2019 FINDINGS: Lower chest: Lung bases clear Hepatobiliary: Gallbladder and liver normal appearance Pancreas: Normal appearance Spleen: Normal appearance Adrenals/Urinary Tract: Adrenal glands, kidneys, ureters, and bladder normal appearance Stomach/Bowel: Normal appendix. Stomach and bowel loops normal appearance Vascular/Lymphatic: Aorta normal caliber. Vascular structures patent. Numerous prominent vessels in the adnexa, nonspecific but can be seen with pelvic congestion syndrome. No adenopathy. Reproductive: Unremarkable uterus and ovaries Other: No free air or free fluid. No hernia or inflammatory process. 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 Osseous structures otherwise unremarkable. 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 Numerous prominent vessels in the adnexa, nonspecific but can be seen with pelvic congestion syndrome. Otherwise negative exam. Electronically Signed   By: Ulyses Southward M.D.   On: 06/26/2022 13:15   US Pelvis Complete  Result Date: 06/26/2022 CLINICAL DATA:  Right lower quadrant pain. EXAM: TRANSABDOMINAL AND TRANSVAGINAL ULTRASOUND OF PELVIS DOPPLER ULTRASOUND OF OVARIES TECHNIQUE: Both transabdominal and transvaginal ultrasound examinations of the pelvis were performed. Transabdominal technique was performed for global imaging of the pelvis including uterus, ovaries, adnexal regions, and pelvic  cul-de-sac. It was necessary to proceed with endovaginal exam following the transabdominal exam to visualize the uterus, endometrium, ovaries, and adnexae. Color and duplex Doppler ultrasound was utilized to evaluate blood flow to the ovaries. COMPARISON:  ultrasound pelvis 02/15/2020 FINDINGS: Uterus The uterus is anteverted. Measurements: 7.8 x 4.4 x 5.3 cm = volume: 95 mL. No fibroids or other mass visualized. Endometrium Thickness: 4 mm, within normal limits. Mild free fluid is seen within the endometrial canal, measuring up to 2 mm in AP dimension Right ovary Measurements: 4.2 x 2.1 x 2.0 cm = volume: 9 mL. Normal appearance/no adnexal mass. Left ovary Measurements: 2.8 x 2.7 x 2.5 cm = volume: 10 mL. Normal appearance/no adnexal mass. Pulsed Doppler evaluation of both ovaries demonstrates normal low-resistance arterial and venous waveforms. Other findings No abnormal free fluid. IMPRESSION: 1. Normal thickness of the endometrium. Mild free fluid within the endometrial canal. 2. Normal appearance of the bilateral ovaries. Electronically Signed   By: Neita Garnet M.D.   On: 06/26/2022 11:13   US Transvaginal Non-OB  Result Date: 06/26/2022 CLINICAL DATA:  Right lower quadrant pain. EXAM: TRANSABDOMINAL AND TRANSVAGINAL ULTRASOUND OF PELVIS DOPPLER ULTRASOUND OF OVARIES TECHNIQUE: Both transabdominal and transvaginal ultrasound examinations of the pelvis were performed. Transabdominal technique was performed for global imaging of the pelvis including uterus, ovaries, adnexal regions, and pelvic cul-de-sac. It was necessary to proceed with endovaginal exam following the transabdominal exam to visualize the uterus, endometrium, ovaries, and adnexae. Color and duplex Doppler ultrasound was utilized to evaluate blood flow to  the ovaries. COMPARISON:  ultrasound pelvis 02/15/2020 FINDINGS: Uterus The uterus is anteverted. Measurements: 7.8 x 4.4 x 5.3 cm = volume: 95 mL. No fibroids or other mass visualized.  Endometrium Thickness: 4 mm, within normal limits. Mild free fluid is seen within the endometrial canal, measuring up to 2 mm in AP dimension Right ovary Measurements: 4.2 x 2.1 x 2.0 cm = volume: 9 mL. Normal appearance/no adnexal mass. Left ovary Measurements: 2.8 x 2.7 x 2.5 cm = volume: 10 mL. Normal appearance/no adnexal mass. Pulsed Doppler evaluation of both ovaries demonstrates normal low-resistance arterial and venous waveforms. Other findings No abnormal free fluid. IMPRESSION: 1. Normal thickness of the endometrium. Mild free fluid within the endometrial canal. 2. Normal appearance of the bilateral ovaries. Electronically Signed   By: Neita Garnet M.D.   On: 06/26/2022 11:13   [place] Art/Ven Flow Abd Pelv Doppler  Result Date: 06/26/2022 CLINICAL DATA:  RIGHT lower quadrant pain. Evaluate for ovarian torsion. EXAM: TRANSABDOMINAL AND TRANSVAGINAL ULTRASOUND OF PELVIS DOPPLER ULTRASOUND OF OVARIES TECHNIQUE: Both transabdominal and transvaginal ultrasound examinations of the pelvis were performed. Transabdominal technique was performed for global imaging of the pelvis including uterus, ovaries, adnexal regions, and pelvic cul-de-sac. It was necessary to proceed with endovaginal exam following the transabdominal exam to visualize the endometrium and ovaries. Color and duplex Doppler ultrasound was utilized to evaluate blood flow to the ovaries. COMPARISON:  None Available. FINDINGS: Uterus Measurements: Anteverted uterus measures 7.8 x 7.4 x 5.3 cm = volume: 95 mL. Myometrium is normal echogenicity. No mass lesion. Endometrium Thickness: Endometrium is normal thickness at 3.7 mm. Small amount of fluid is seen within the endometrial canal. Right ovary Measurements: 4.2 x 2.1 x 2.0 cm = volume: 9.1 mL. Multiple small follicles. Left ovary Measurements: 2.8 x 2.72.5 cm = volume: 9.7 mL. Multiple small follicles. Pulsed Doppler evaluation of both ovaries demonstrates normal low-resistance arterial and venous  waveforms. Other findings No abnormal free fluid. IMPRESSION: Normal ovaries with small follicles. Normal size with normal arterial and venous vascular waveforms. Normal uterus and endometrium. Electronically Signed   By: Genevive Bi M.D.   On: 06/26/2022 11:08     Labs:   Basic Metabolic Panel: Recent Labs  Lab 06/26/22 0956 06/26/22 1655 06/27/22 0412 06/28/22 0441  NA 137  --  136 137  K 3.4*  --  3.9 3.8  CL 105  --  110 106  CO2 21*  --  18* 23  GLUCOSE 260*  --  189* 195*  BUN 13  --  8 13  CREATININE 0.44  --  0.35* 0.32*  CALCIUM 8.9  --  8.0* 8.7*  MG  --  1.6* 2.2  --    GFR Estimated Creatinine Clearance: 93.7 mL/min (A) (by C-G formula based on SCr of 0.32 mg/dL (L)). Liver Function Tests: Recent Labs  Lab 06/26/22 0956 06/27/22 0412  AST 13* 10*  ALT 13 10  ALKPHOS 58 51  BILITOT 1.2 1.2  PROT 7.4 6.9  ALBUMIN 3.6 3.3*   No results for input(s): "LIPASE", "AMYLASE" in the last 168 hours. No results for input(s): "AMMONIA" in the last 168 hours. Coagulation profile Recent Labs  Lab 06/26/22 1458  INR 1.2    CBC: Recent Labs  Lab 06/26/22 0956 06/27/22 0412 06/28/22 0441  WBC 22.5* 19.8* 11.3*  NEUTROABS 18.6*  --   --   HGB 12.2 10.7* 10.7*  HCT 35.3* 33.0* 32.0*  MCV 87.4 90.7 89.6  PLT 375 363 348  Cardiac Enzymes: No results for input(s): "CKTOTAL", "CKMB", "CKMBINDEX", "TROPONINI" in the last 168 hours. BNP: Invalid input(s): "POCBNP" CBG: Recent Labs  Lab 06/27/22 1410 06/27/22 1657 06/27/22 2148 06/28/22 0519 06/28/22 0737  GLUCAP 204* 190* 188* 168* 199*   D-Dimer No results for input(s): "DDIMER" in the last 72 hours. Hgb A1c Recent Labs    06/26/22 1655  HGBA1C 9.7*   Lipid Profile No results for input(s): "CHOL", "HDL", "LDLCALC", "TRIG", "CHOLHDL", "LDLDIRECT" in the last 72 hours. Thyroid function studies Recent Labs    06/26/22 1655  TSH 0.354   Anemia work up No results for input(s):  "VITAMINB12", "FOLATE", "FERRITIN", "TIBC", "IRON", "RETICCTPCT" in the last 72 hours. Microbiology Recent Results (from the past 240 hour(s))  Urine Culture     Status: Abnormal   Collection Time: 06/26/22 12:17 PM   Specimen: In/Out Cath Urine  Result Value Ref Range Status   Specimen Description   Final    IN/OUT CATH URINE Performed at [facility], [address] [place], [place] [address]    Special Requests   Final    NONE Performed at [facility], [address] [place], [place] [address]    Culture MULTIPLE SPECIES PRESENT, SUGGEST RECOLLECTION (A)  Final   Report Status 06/28/2022 FINAL  Final  Blood Culture (routine x 2)     Status: None (Preliminary result)   Collection Time: 06/26/22  2:54 PM   Specimen: BLOOD  Result Value Ref Range Status   Specimen Description   Final    BLOOD BLOOD LEFT HAND Performed at [facility], [address], [place], [place] [address]    Special Requests   Final    BOTTLES DRAWN AEROBIC AND ANAEROBIC Blood Culture adequate volume Performed at [facility], [address], [place], [place] [address]    Culture   Final    NO GROWTH 2 DAYS Performed at [facility], [address], [place], [place] [address]    Report Status PENDING  Incomplete  Resp Panel by RT-PCR (Flu A&B, Covid) Anterior Nasal Swab     Status: None   Collection Time: 06/26/22  2:57 PM   Specimen: Anterior Nasal Swab  Result Value Ref Range Status   SARS Coronavirus 2 by RT PCR NEGATIVE NEGATIVE Final    Comment: (NOTE) SARS-CoV-2 target nucleic acids are NOT DETECTED.  The SARS-CoV-2 RNA is generally detectable in upper respiratory specimens during the acute phase of infection. The lowest concentration of SARS-CoV-2 viral copies this assay can detect is 138 copies/mL. A negative result does not preclude SARS-Cov-2 infection and should not be used as the sole basis for treatment  or other patient management decisions. A negative result may occur with  improper specimen collection/handling, submission of specimen other than nasopharyngeal swab, presence of viral mutation(s) within the areas targeted by this assay, and inadequate number of viral copies(<138 copies/mL). A negative result must be combined with clinical observations, patient history, and epidemiological information. The expected result is Negative.  Fact Sheet for Patients:  BloggerCourse.com  Fact Sheet for Healthcare Providers:  SeriousBroker.it  This test is no t yet approved or cleared by the [place] FDA and  has been authorized for detection and/or diagnosis of SARS-CoV-2 by FDA under an Emergency Use Authorization (EUA). This EUA will remain  in effect (meaning this test can be used) for the duration of the COVID-19 declaration under Section 564(b)(1) of the Act, 21 U.S.C.section  360bbb-3(b)(1), unless the authorization is terminated  or revoked sooner.       Influenza A by PCR NEGATIVE NEGATIVE Final   Influenza B by PCR NEGATIVE NEGATIVE Final    Comment: (NOTE) The Xpert Xpress SARS-CoV-2/FLU/RSV plus assay is intended as an aid in the diagnosis of influenza from Nasopharyngeal swab specimens and should not be used as a sole basis for treatment. Nasal washings and aspirates are unacceptable for Xpert Xpress SARS-CoV-2/FLU/RSV testing.  Fact Sheet for Patients: BloggerCourse.com  Fact Sheet for Healthcare Providers: SeriousBroker.it  This test is not yet approved or cleared by the [place] FDA and has been authorized for detection and/or diagnosis of SARS-CoV-2 by FDA under an Emergency Use Authorization (EUA). This EUA will remain in effect (meaning this test can be used) for the duration of the COVID-19 declaration under Section 564(b)(1) of the Act, 21 U.S.C. section  360bbb-3(b)(1), unless the authorization is terminated or revoked.  Performed at [facility], [address] [place], [place] [address]   Blood Culture (routine x 2)     Status: None (Preliminary result)   Collection Time: 06/26/22  2:58 PM   Specimen: BLOOD  Result Value Ref Range Status   Specimen Description   Final    BLOOD BLOOD RIGHT HAND Performed at [facility], [address], [place], [place] [address]    Special Requests   Final    BOTTLES DRAWN AEROBIC AND ANAEROBIC Blood Culture adequate volume Performed at [facility], [address] [place], [place] [address]    Culture   Final    NO GROWTH 2 DAYS Performed at [facility], [address], [place], [place] [address]    Report Status PENDING  Incomplete     Discharge Instructions:   Discharge Instructions     Diet Carb Modified   Complete by: As directed    Discharge wound care:   Complete by: As directed    Continue warm/moist compresses to area on buttock, monitor for worsening and if closes/worsens would go to urgent care or a med center for an I/D in the ER   Increase activity slowly   Complete by: As directed       Allergies as of 06/28/2022       Reactions   Morphine And Related Itching        Medication List     TAKE these medications    ibuprofen 200 MG tablet Commonly known as: ADVIL Take 200 mg by mouth every 6 (six) hours as needed for headache.   insulin glargine 100 UNIT/ML injection Commonly known as: LANTUS Inject 0.2 mLs (20 Units total) into the skin 2 (two) times daily. What changed:  how much to take additional instructions   insulin lispro 100 UNIT/ML KwikPen Commonly known as: HumaLOG KwikPen Inject 0-15 Units into the skin 3 (three) times daily. CBG 70 - 120: 0 units CBG 121 - 150: 2 units CBG 151 - 200: 3 units CBG 201 - 250: 5 units CBG 251 - 300: 8 units CBG 301 - 350: 11 units CBG 351 - 400: 15  units What changed:  how much to take additional instructions Another medication with the same name was removed. Continue taking this medication, and follow the directions you see here.   ondansetron 4 MG tablet Commonly known as: ZOFRAN Take 1 tablet (4 mg total) by mouth every 6 (six) hours as needed for nausea.   sulfamethoxazole-trimethoprim 800-160 MG  tablet Commonly known as: BACTRIM DS Take 1 tablet by mouth every 12 (twelve) hours.   traMADol 50 MG tablet Commonly known as: Ultram Take 1 tablet (50 mg total) by mouth every 6 (six) hours as needed.               Discharge Care Instructions  (From admission, onward)           Start     Ordered   06/28/22 0000  Discharge wound care:       Comments: Continue warm/moist compresses to area on buttock, monitor for worsening and if closes/worsens would go to urgent care or a med center for an I/D in the ER   06/28/22 0850            Follow-up Information     [facility] Follow up.   Contact information: [address] [place] [address] [phone]                  Time coordinating discharge: 45 min  Signed:  Joseph Art DO  [facility] 06/28/2022, 8:50 AM

## 2022-07-01 LAB — CULTURE, BLOOD (ROUTINE X 2)
Culture: NO GROWTH
Culture: NO GROWTH
Special Requests: ADEQUATE
Special Requests: ADEQUATE

## 2022-09-25 ENCOUNTER — Other Ambulatory Visit: Payer: Self-pay

## 2022-09-25 ENCOUNTER — Emergency Department (HOSPITAL_BASED_OUTPATIENT_CLINIC_OR_DEPARTMENT_OTHER)
Admission: EM | Admit: 2022-09-25 | Discharge: 2022-09-25 | Disposition: A | Discharge status: Home / Self Care | Payer: Medicaid Other | Attending: Emergency Medicine | Admitting: Emergency Medicine

## 2022-09-25 ENCOUNTER — Emergency Department (HOSPITAL_BASED_OUTPATIENT_CLINIC_OR_DEPARTMENT_OTHER): Payer: Medicaid Other

## 2022-09-25 DIAGNOSIS — N8003 Adenomyosis of the uterus: Secondary | ICD-10-CM | POA: Insufficient documentation

## 2022-09-25 DIAGNOSIS — N939 Abnormal uterine and vaginal bleeding, unspecified: Secondary | ICD-10-CM | POA: Insufficient documentation

## 2022-09-25 LAB — COMPREHENSIVE METABOLIC PANEL
ALT: 11 U/L (ref 0–44)
AST: 11 U/L — ABNORMAL LOW (ref 15–41)
Albumin: 4.6 g/dL (ref 3.5–5.0)
Alkaline Phosphatase: 52 U/L (ref 38–126)
Anion gap: 12 (ref 5–15)
BUN: 10 mg/dL (ref 6–20)
CO2: 26 mmol/L (ref 22–32)
Calcium: 9.3 mg/dL (ref 8.9–10.3)
Chloride: 101 mmol/L (ref 98–111)
Creatinine, Ser: 0.48 mg/dL (ref 0.44–1.00)
GFR, Estimated: >60 mL/min (ref >60)
Glucose, Bld: 287 mg/dL — ABNORMAL HIGH (ref 70–99)
Potassium: 3.8 mmol/L (ref 3.5–5.1)
Sodium: 139 mmol/L (ref 135–145)
Total Bilirubin: 0.3 mg/dL (ref 0.3–1.2)
Total Protein: 7.4 g/dL (ref 6.5–8.1)

## 2022-09-25 LAB — URINALYSIS, ROUTINE W REFLEX MICROSCOPIC
Bilirubin Urine: NEGATIVE
Glucose, UA: >1000 mg/dL — AB
Ketones, ur: 15 mg/dL — AB
Leukocytes,Ua: NEGATIVE
Nitrite: NEGATIVE
RBC / HPF: >50 RBC/hpf — ABNORMAL HIGH (ref 0–5)
Specific Gravity, Urine: 1.043 — ABNORMAL HIGH (ref 1.005–1.030)
pH: 7 (ref 5.0–8.0)

## 2022-09-25 LAB — CBC
HCT: 36.8 % (ref 36.0–46.0)
Hemoglobin: 12.3 g/dL (ref 12.0–15.0)
MCH: 28.6 pg (ref 26.0–34.0)
MCHC: 33.4 g/dL (ref 30.0–36.0)
MCV: 85.6 fL (ref 80.0–100.0)
Platelets: 419 10*3/uL — ABNORMAL HIGH (ref 150–400)
RBC: 4.3 MIL/uL (ref 3.87–5.11)
RDW: 13.2 % (ref 11.5–15.5)
WBC: 8.2 10*3/uL (ref 4.0–10.5)
nRBC: 0 % (ref 0.0–0.2)

## 2022-09-25 LAB — PREGNANCY, URINE: Preg Test, Ur: NEGATIVE

## 2022-09-25 LAB — LIPASE, BLOOD: Lipase: 32 U/L (ref 11–51)

## 2022-09-25 NOTE — ED Triage Notes (Signed)
Patient arrives with complaints of heavy vaginal bleeding. Patient states that she initially had regular vagina bleeding yesterday (patient though it was her menstrual cycle). Today, the patient started bleeding through several pads/tampons and passed a large blood clot.  Rates her abdominal pain 6/10.

## 2022-09-25 NOTE — ED Notes (Signed)
Pt transported to [facility]

## 2022-09-25 NOTE — ED Provider Notes (Signed)
[facility] EMERGENCY DEPT Provider Note   CSN: [id number] Arrival date & time: 09/25/22  1708     History  Chief Complaint  Patient presents with   Abdominal Pain   Vaginal Bleeding    Evelyn [name] is a 36 y.o. female.   Abdominal Pain Associated symptoms: vaginal bleeding   Vaginal Bleeding Associated symptoms: abdominal pain     36 year old female presents emergency department with complaints of vaginal bleeding.  Patient reports that she began her menstrual cycle yesterday.  She reports increasing bleeding today going through 8 tampons and pads today.  She states she has noticed some clots of which she has not had before from prior menstrual cycles.  She is concerned about possible miscarriage given that her husband and her have been trying to have a kid for the past few months.  Reports abdominal pain of which is normal for her for her menstrual cycle described as cramping type suprapubic/midline pelvic pain which she is not experiencing very much right now.  Denies chest pain, shortness of breath, nausea, vomiting, urinary symptoms, vaginal discharge, change in bowel habits.  Past medical history significant for pregnancy, diabetes mellitus  Home Medications Prior to Admission medications   Medication Sig Start Date End Date Taking? Authorizing Provider  ibuprofen (ADVIL) 200 MG tablet Take 200 mg by mouth every 6 (six) hours as needed for headache.    [provider]  insulin glargine (LANTUS) 100 UNIT/ML injection Inject 0.2 mLs (20 Units total) into the skin 2 (two) times daily. Patient taking differently: Inject 20-60 Units into the skin 2 (two) times daily. Pt stated she doesn't have a specific sliding scale. 11/20/21   Hollice Espy, MD  insulin lispro (HUMALOG KWIKPEN) 100 UNIT/ML KwikPen Inject 0-15 Units into the skin 3 (three) times daily. CBG 70 - 120: 0 units CBG 121 - 150: 2 units CBG 151 - 200: 3 units CBG 201 - 250: 5 units CBG 251  - 300: 8 units CBG 301 - 350: 11 units CBG 351 - 400: 15 units 06/28/22   Vann, Jessica U, DO  ondansetron (ZOFRAN) 4 MG tablet Take 1 tablet (4 mg total) by mouth every 6 (six) hours as needed for nausea. 06/28/22   Joseph Art, DO  sulfamethoxazole-trimethoprim (BACTRIM DS) 800-160 MG tablet Take 1 tablet by mouth every 12 (twelve) hours. 06/28/22   Joseph Art, DO  traMADol (ULTRAM) 50 MG tablet Take 1 tablet (50 mg total) by mouth every 6 (six) hours as needed. 06/28/22 06/28/23  Joseph Art, DO      Allergies    Morphine and related    Review of Systems   Review of Systems  Gastrointestinal:  Positive for abdominal pain.  Genitourinary:  Positive for vaginal bleeding.  All other systems reviewed and are negative.   Physical Exam Updated Vital Signs BP 126/84 (BP Location: Left Arm)   Pulse 99   Temp 98.1 F (36.7 C) (Oral)   Resp 20   Ht 5\' 4"  (1.626 m)   Wt 65.8 kg   SpO2 97%   BMI 24.89 kg/m  Physical Exam Vitals and nursing note reviewed.  Constitutional:      General: She is not in acute distress.    Appearance: She is well-developed.  HENT:     Head: Normocephalic and atraumatic.  Eyes:     Conjunctiva/sclera: Conjunctivae normal.  Cardiovascular:     Rate and Rhythm: Normal rate and regular rhythm.  Heart sounds: No murmur heard. Pulmonary:     Effort: Pulmonary effort is normal. No respiratory distress.     Breath sounds: Normal breath sounds.  Abdominal:     Palpations: Abdomen is soft.     Tenderness: There is no right CVA tenderness or left CVA tenderness.     Comments: Mild suprapubic/midline pelvic tenderness to palpation.  Genitourinary:    Comments: Patient declined GU exam. Musculoskeletal:        General: No swelling.     Cervical back: Neck supple.  Skin:    General: Skin is warm and dry.     Capillary Refill: Capillary refill takes less than 2 seconds.  Neurological:     Mental Status: She is alert.  Psychiatric:        Mood and  Affect: Mood normal.     ED Results / Procedures / Treatments   Labs (all labs ordered are listed, but only abnormal results are displayed) Labs Reviewed  COMPREHENSIVE METABOLIC PANEL - Abnormal; Notable for the following components:      Result Value   Glucose, Bld 287 (*)    AST 11 (*)    All other components within normal limits  CBC - Abnormal; Notable for the following components:   Platelets 419 (*)    All other components within normal limits  URINALYSIS, ROUTINE W REFLEX MICROSCOPIC - Abnormal; Notable for the following components:   Color, Urine ORANGE (*)    Specific Gravity, Urine 1.043 (*)    Glucose, UA >1,000 (*)    Hgb urine dipstick LARGE (*)    Ketones, ur 15 (*)    Protein, ur TRACE (*)    RBC / HPF >50 (*)    All other components within normal limits  LIPASE, BLOOD  PREGNANCY, URINE    EKG None  Radiology US PELVIC COMPLETE W TRANSVAGINAL AND TORSION R/O  Result Date: 09/25/2022 CLINICAL DATA:  Vaginal bleeding. EXAM: TRANSABDOMINAL AND TRANSVAGINAL ULTRASOUND OF PELVIS DOPPLER ULTRASOUND OF OVARIES TECHNIQUE: Both transabdominal and transvaginal ultrasound examinations of the pelvis were performed. Transabdominal technique was performed for global imaging of the pelvis including uterus, ovaries, adnexal regions, and pelvic cul-de-sac. It was necessary to proceed with endovaginal exam following the transabdominal exam to visualize the endometrium and ovaries. Color and duplex Doppler ultrasound was utilized to evaluate blood flow to the ovaries. COMPARISON:  Pelvic ultrasound dated 06/26/2022 and CT abdomen pelvis dated 06/26/2022. FINDINGS: Uterus Measurements: 7.9 x 4.1 x 4.5 cm = volume: 77 mL. The uterus is anteverted and slightly heterogeneous with findings suggestive of adenomyosis. A C-section scar is noted. Endometrium Thickness: 2 mm. Trace fluid noted in the endometrium. Small echogenic focus in the fundal endometrium on cine images may represent  debris. A polyp is less likely but not excluded. Hysterosonography or hysteroscopy may provide better evaluation if clinically indicated in this patient with vaginal bleeding. Right ovary Measurements: 2.9 x 2.3 x 3.7 cm = volume: 13 mL. There is a corpus luteum in the right ovary. Left ovary Measurements: 3.1 x 2.6 x 2.5 cm = volume: 11 mL. Normal appearance/no adnexal mass. Pulsed Doppler evaluation of both ovaries demonstrates normal low-resistance arterial and venous waveforms. Other findings No abnormal free fluid. IMPRESSION: 1. Findings suggestive of adenomyosis. 2. Blood clot or proteinaceous debris versus less likely a small polyp in the fundal endometrium. Clinical correlation is recommended. 3. Unremarkable ovaries. Electronically Signed   By: Elgie Collard M.D.   On: 09/25/2022 21:48  Procedures Procedures    Medications Ordered in ED Medications - No data to display  ED Course/ Medical Decision Making/ A&P Clinical Course as of 09/25/22 2208  Tue Sep 25, 2022  1958 Menstrual cycle yest. Inc bleeding today [CR]    Clinical Course User Index [CR] Peter Garter, PA                           Medical Decision Making Amount and/or Complexity of Data Reviewed Labs: ordered. Radiology: ordered.   This patient presents to the ED for concern of vaginal bleeding, this involves an extensive number of treatment options, and is a complaint that carries with it a high risk of complications and morbidity.  The differential diagnosis includes polyps, adenomyosis, leiomyoma, malignancy/hyperplasia, coagulopathy, ovarian dysfunction, normal menstrual cycle   Co morbidities that complicate the patient evaluation  See HPI   Additional history obtained:  Additional history obtained from EMR External records from outside source obtained and reviewed including hospital records   Lab Tests:  I Ordered, and personally interpreted labs.  The pertinent results include: No  leukocytosis noted.  No obvious anemia.  Platelets mildly concerning for thrombocytosis of 419 of which patient has had elevation of platelets in the past and seems to run high of normal range.  No electrolyte abnormality noted.  No transaminitis noted.  Renal function within normal limits.  Lipase in the normal limits.  Urine pregnancy negative.   Imaging Studies ordered:  I ordered imaging studies including pelvic ultrasound I independently visualized and interpreted imaging which showed (of adenomyosis.  Blood: For proteinaceous debris versus less likely a small polyp in the fundal endometrium.  Unremarkable ovaries. I agree with the radiologist interpretation  Cardiac Monitoring: / EKG:  The patient was maintained on a cardiac monitor.  I personally viewed and interpreted the cardiac monitored which showed an underlying rhythm of: Sinus rhythm   Consultations Obtained:  N/a   Problem List / ED Course / Critical interventions / Medication management  Vaginal bleeding Reevaluation of the patient showed that the patient stayed the same I have reviewed the patients home medicines and have made adjustments as needed   Social Determinants of Health:  Denies tobacco, illicit drug use   Test / Admission - Considered:  Vaginal bleeding Vitals signs within normal range and stable throughout visit. Laboratory/imaging studies significant for: See above Patient's vaginal bleeding most likely secondary to normal menstruation with evidence of adenomyoma likely increasing bleeding from what her baseline is.  Recommend reevaluation by OB/GYN outpatient.  Resources given to patient upon discharge.  In the meantime, Tylenol/Motrin recommended for menstrual type pain.  Patient not obviously anemic today so doubt gross hemorrhage.  Treatment plan discussed length with patient and she cannot understand was agreeable to said plan. Worrisome signs and symptoms were discussed with the patient, and  the patient acknowledged understanding to return to the ED if noticed. Patient was stable upon discharge.          Final Clinical Impression(s) / ED Diagnoses Final diagnoses:  Vaginal bleeding  Adenomyosis    Rx / DC Orders ED Discharge Orders     None         Peter Garter, [place] 09/25/22 2208    Mardene Sayer, MD 09/26/22 1229

## 2022-09-25 NOTE — Discharge Instructions (Addendum)
With your workup today was overall reassuring.  As discussed, most likely cause of your increased menstrual bleeding is evidence of adenomyosis.  Recommend reevaluation by OB/GYN outpatient.  Take Tylenol/Motrin as needed for cramping type menstrual pain.  Please not hesitate to return to emergency department for worrisome signs and symptoms we discussed become apparent.

## 2022-10-01 ENCOUNTER — Ambulatory Visit (INDEPENDENT_AMBULATORY_CARE_PROVIDER_SITE_OTHER): Payer: Medicaid Other | Admitting: Adult Health

## 2022-10-01 ENCOUNTER — Encounter: Payer: Self-pay | Admitting: Adult Health

## 2022-10-01 VITALS — BP 126/81 | HR 101 | Ht 64.0 in | Wt 143.0 lb

## 2022-10-01 DIAGNOSIS — N920 Excessive and frequent menstruation with regular cycle: Secondary | ICD-10-CM

## 2022-10-01 DIAGNOSIS — Z319 Encounter for procreative management, unspecified: Secondary | ICD-10-CM | POA: Diagnosis not present

## 2022-10-01 NOTE — Progress Notes (Signed)
  Subjective:     Patient ID: Evelyn [name], female   DOB: 1986-02-06, 36 y.o.   MRN: [id number]  HPI [name] is a 36 year old white female, single, G2P2 in following up from being seen in ER 09/25/22 for heavy bleeding, it has stopped, her HGB was 12.3 and US showed:IMPRESSION: 1. Findings suggestive of adenomyosis. 2. Blood clot or proteinaceous debris versus less likely a small polyp in the fundal endometrium. Clinical correlation is recommended. 3. Unremarkable ovaries  Last pap was 04/11/21, negative HPV at [facility] PCP is [facility] Review of Systems Bleeding has stopped Periods regular Reviewed past medical,surgical, social and family history. Reviewed medications and allergies.     Objective:   Physical Exam BP 126/81 (BP Location: Right Arm, Patient Position: Sitting, Cuff Size: Normal)   Pulse (!) 101   Ht 5\' 4"  (1.626 m)   Wt 143 lb (64.9 kg)   LMP 09/24/2022   Breastfeeding No   BMI 24.55 kg/m     Skin warm and dry.Pelvic: external genitalia is normal in appearance no lesions, vagina: brown discharge,urethra has no lesions or masses noted, cervix:smooth and bulbous, uterus: normal size, shape and contour, non tender, no masses felt, adnexa: no masses or tenderness noted. Bladder is non tender and no masses felt.   AA is 0 Fall risk is low    10/01/2022    2:40 PM  Depression screen PHQ 2/9  Decreased Interest 1  Down, Depressed, Hopeless 1  PHQ - 2 Score 2  Altered sleeping 1  Tired, decreased energy 1  Change in appetite 1  Feeling bad or failure about yourself  1  Trouble concentrating 0  Moving slowly or fidgety/restless 0  Suicidal thoughts 0  PHQ-9 Score 6       10/01/2022    2:40 PM  GAD 7 : Generalized Anxiety Score  Nervous, Anxious, on Edge 1  Control/stop worrying 1  Worry too much - different things 1  Trouble relaxing 1  Restless 0  Easily annoyed or irritable 1  Afraid - awful might happen 1  Total GAD 7 Score 6      Upstream -  10/01/22 1439       Pregnancy Intention Screening   Does the patient want to become pregnant in the next year? Yes    Does the patient's partner want to become pregnant in the next year? Yes    Would the patient like to discuss contraceptive options today? No      Contraception Wrap Up   Current Method Pregnant/Seeking Pregnancy    End Method Pregnant/Seeking Pregnancy    Contraception Counseling Provided No            Examination chaperoned by 14/11/23 RN  Assessment:     1. Menorrhagia with regular cycle Had heavy bleeding 09/25/22 and it has stopped If heavy bleeding returns may get sonohysterogram   2. Patient desires pregnancy Take OTC Prenatal vitamin Call when next period starts, will check progesterone level day 21    Plan:      Follow up prn

## 2022-10-16 DIAGNOSIS — Z20822 Contact with and (suspected) exposure to covid-19: Secondary | ICD-10-CM | POA: Insufficient documentation

## 2022-10-16 DIAGNOSIS — R051 Acute cough: Secondary | ICD-10-CM | POA: Insufficient documentation

## 2022-10-26 ENCOUNTER — Telehealth: Payer: Self-pay | Admitting: Adult Health

## 2022-10-26 DIAGNOSIS — Z319 Encounter for procreative management, unspecified: Secondary | ICD-10-CM

## 2022-10-26 NOTE — Addendum Note (Signed)
Addended by: Derrek Monaco A on: 10/26/2022 01:44 PM   Modules accepted: Orders

## 2022-10-26 NOTE — Telephone Encounter (Signed)
Ck progesterone level 11/14/22, order is in just go to [place]

## 2022-10-26 NOTE — Telephone Encounter (Signed)
Patient called today to let you know that she started her period yesterday afternoon. Please advise.

## 2023-05-04 ENCOUNTER — Encounter (HOSPITAL_COMMUNITY): Payer: Self-pay

## 2023-05-04 ENCOUNTER — Emergency Department (HOSPITAL_COMMUNITY)
Admission: EM | Admit: 2023-05-04 | Discharge: 2023-05-04 | Disposition: A | Discharge status: Home / Self Care | Payer: Medicaid Other | Attending: Emergency Medicine | Admitting: Emergency Medicine

## 2023-05-04 ENCOUNTER — Other Ambulatory Visit: Payer: Self-pay

## 2023-05-04 ENCOUNTER — Emergency Department (HOSPITAL_COMMUNITY): Payer: Medicaid Other

## 2023-05-04 DIAGNOSIS — E119 Type 2 diabetes mellitus without complications: Secondary | ICD-10-CM | POA: Insufficient documentation

## 2023-05-04 DIAGNOSIS — Z794 Long term (current) use of insulin: Secondary | ICD-10-CM | POA: Insufficient documentation

## 2023-05-04 DIAGNOSIS — R000 Tachycardia, unspecified: Secondary | ICD-10-CM | POA: Insufficient documentation

## 2023-05-04 DIAGNOSIS — K0889 Other specified disorders of teeth and supporting structures: Secondary | ICD-10-CM | POA: Insufficient documentation

## 2023-05-04 DIAGNOSIS — R079 Chest pain, unspecified: Secondary | ICD-10-CM | POA: Diagnosis present

## 2023-05-04 LAB — BASIC METABOLIC PANEL
Anion gap: 9 (ref 5–15)
BUN: 13 mg/dL (ref 6–20)
CO2: 25 mmol/L (ref 22–32)
Calcium: 9.1 mg/dL (ref 8.9–10.3)
Chloride: 99 mmol/L (ref 98–111)
Creatinine, Ser: 0.47 mg/dL (ref 0.44–1.00)
GFR, Estimated: >60 mL/min (ref >60)
Glucose, Bld: 291 mg/dL — ABNORMAL HIGH (ref 70–99)
Potassium: 3.7 mmol/L (ref 3.5–5.1)
Sodium: 133 mmol/L — ABNORMAL LOW (ref 135–145)

## 2023-05-04 LAB — CBG MONITORING, ED: Glucose-Capillary: 251 mg/dL — ABNORMAL HIGH (ref 70–99)

## 2023-05-04 LAB — HCG, SERUM, QUALITATIVE: Preg, Serum: NEGATIVE

## 2023-05-04 LAB — CBC
HCT: 40.1 % (ref 36.0–46.0)
Hemoglobin: 13.2 g/dL (ref 12.0–15.0)
MCH: 28.5 pg (ref 26.0–34.0)
MCHC: 32.9 g/dL (ref 30.0–36.0)
MCV: 86.6 fL (ref 80.0–100.0)
Platelets: 401 10*3/uL — ABNORMAL HIGH (ref 150–400)
RBC: 4.63 MIL/uL (ref 3.87–5.11)
RDW: 12.9 % (ref 11.5–15.5)
WBC: 9.2 10*3/uL (ref 4.0–10.5)
nRBC: 0 % (ref 0.0–0.2)

## 2023-05-04 LAB — TROPONIN I (HIGH SENSITIVITY)
Troponin I (High Sensitivity): <2 ng/L (ref <18)
Troponin I (High Sensitivity): <2 ng/L (ref <18)

## 2023-05-04 LAB — D-DIMER, QUANTITATIVE: D-Dimer, Quant: 0.28 ug/mL-FEU (ref 0.00–0.50)

## 2023-05-04 MED ORDER — HYDROCODONE-ACETAMINOPHEN 5-325 MG PO TABS: 1.0000 | ORAL_TABLET | ORAL | 0 refills | Status: DC | PRN

## 2023-05-04 MED ORDER — FENTANYL CITRATE PF 50 MCG/ML IJ SOSY
50.0000 ug | PREFILLED_SYRINGE | Freq: Once | INTRAMUSCULAR | Status: AC
  Administered 2023-05-04: 50 ug via INTRAVENOUS
  Filled 2023-05-04: qty 1

## 2023-05-04 MED ORDER — SODIUM CHLORIDE 0.9 % IV BOLUS
1000.0000 mL | Freq: Once | INTRAVENOUS | Status: AC
  Administered 2023-05-04: 1000 mL via INTRAVENOUS

## 2023-05-04 MED ORDER — ONDANSETRON HCL 4 MG/2ML IJ SOLN
4.0000 mg | Freq: Once | INTRAMUSCULAR | Status: AC
  Administered 2023-05-04: 4 mg via INTRAVENOUS
  Filled 2023-05-04: qty 2

## 2023-05-04 NOTE — Discharge Instructions (Addendum)
You were evaluated today for chest pain. Your workup was reassuring for no signs of acute coronary syndrome and no signs of pulmonary embolism. I have prescribed pain medication to be taken instead of the previously prescribed Tylenol 3. Be sure not to exceed 4000 mg of acetaminophen from all sources in a 24 hour period. You may take up to 800 mg ibuprofen every 6 hours in addition to the prescribed pain medication. Please follow up with your oral surgeon for further dental pain control. Please follow up with your primary care to discuss your chest pain. If you develop worsening chest pain, shortness of breath, or other life threatening symptoms, please return to the emergency department.

## 2023-05-04 NOTE — ED Provider Notes (Signed)
[facility] EMERGENCY DEPARTMENT AT [facility] Provider Note   CSN: [id number] Arrival date & time: 05/04/23  0031     History  No chief complaint on file.   Evelyn [name] is a 37 y.o. female.  Patient presents to the emergency department complaining of left-sided chest pain which began Wednesday night.  She states the pain waxes and wanes from 8 out of 10 in severity to 2 out of 10 in severity.  The pain is described as a pressure.  She denies associated shortness of breath or abdominal pain.  She denies radiation of symptoms.  She does endorse nausea with an episode of dry heaving earlier tonight.  The patient states on Wednesday she had dental surgery where 14 teeth were removed.  She has been taking Tylenol 3 for pain control at home.  She questions if she may be having anxiety related to postop dental pain.  Past medical history significant for type II DM, no longer insulin-dependent, sinus tachycardia  HPI     Home Medications Prior to Admission medications   Medication Sig Start Date End Date Taking? Authorizing Provider  HYDROcodone-acetaminophen (NORCO/VICODIN) 5-325 MG tablet Take 1 tablet by mouth every 4 (four) hours as needed for severe pain. 05/04/23  Yes Darrick Grinder, PA-C  ibuprofen (ADVIL) 200 MG tablet Take 200 mg by mouth every 6 (six) hours as needed for headache.    [provider]  insulin glargine (LANTUS) 100 UNIT/ML injection Inject 0.2 mLs (20 Units total) into the skin 2 (two) times daily. Patient taking differently: Inject 20-60 Units into the skin 2 (two) times daily. Pt stated she doesn't have a specific sliding scale. 11/20/21   Hollice Espy, MD  insulin lispro (HUMALOG KWIKPEN) 100 UNIT/ML KwikPen Inject 0-15 Units into the skin 3 (three) times daily. CBG 70 - 120: 0 units CBG 121 - 150: 2 units CBG 151 - 200: 3 units CBG 201 - 250: 5 units CBG 251 - 300: 8 units CBG 301 - 350: 11 units CBG 351 - 400: 15 units 06/28/22   Joseph Art, DO      Allergies    Morphine and codeine    Review of Systems   Review of Systems  Physical Exam Updated Vital Signs BP 109/78   Pulse 95   Temp 98.6 F (37 C) (Oral)   Resp 11   Ht 5\' 4"  (1.626 m)   Wt 59 kg   SpO2 100%   BMI 22.31 kg/m  Physical Exam Vitals and nursing note reviewed.  Constitutional:      General: She is not in acute distress.    Appearance: She is well-developed.  HENT:     Head: Normocephalic and atraumatic.  Eyes:     Conjunctiva/sclera: Conjunctivae normal.  Cardiovascular:     Rate and Rhythm: Regular rhythm. Tachycardia present.     Heart sounds: No murmur heard. Pulmonary:     Effort: Pulmonary effort is normal. No respiratory distress.     Breath sounds: Normal breath sounds.  Abdominal:     Palpations: Abdomen is soft.     Tenderness: There is no abdominal tenderness.  Musculoskeletal:        General: No swelling.     Cervical back: Neck supple.  Skin:    General: Skin is warm and dry.     Capillary Refill: Capillary refill takes less than 2 seconds.  Neurological:     Mental Status: She is alert.  Psychiatric:        Mood and Affect: Mood normal.     ED Results / Procedures / Treatments   Labs (all labs ordered are listed, but only abnormal results are displayed) Labs Reviewed  BASIC METABOLIC PANEL - Abnormal; Notable for the following components:      Result Value   Sodium 133 (*)    Glucose, Bld 291 (*)    All other components within normal limits  CBC - Abnormal; Notable for the following components:   Platelets 401 (*)    All other components within normal limits  CBG MONITORING, ED - Abnormal; Notable for the following components:   Glucose-Capillary 251 (*)    All other components within normal limits  D-DIMER, QUANTITATIVE  HCG, SERUM, QUALITATIVE  TROPONIN I (HIGH SENSITIVITY)  TROPONIN I (HIGH SENSITIVITY)    EKG EKG Interpretation Date/Time:  Saturday May 04 2023 00:57:39 EDT Ventricular  Rate:  99 PR Interval:  130 QRS Duration:  90 QT Interval:  380 QTC Calculation: 488 R Axis:   75  Text Interpretation: Sinus rhythm Consider right atrial enlargement Borderline prolonged QT interval Otherwise no significant change Confirmed by Drema Pry [phone]) on 05/04/2023 1:21:54 AM  Radiology DG Chest Port 1 View  Result Date: 05/04/2023 CLINICAL DATA:  Chest and abdominal pain EXAM: PORTABLE CHEST 1 VIEW COMPARISON:  Radiograph 06/26/2022 FINDINGS: The heart size and mediastinal contours are within normal limits. Both lungs are clear. The visualized skeletal structures are unremarkable. IMPRESSION: No active disease. Electronically Signed   By: Minerva Fester M.D.   On: 05/04/2023 01:37    Procedures Procedures    Medications Ordered in ED Medications  ondansetron (ZOFRAN) injection 4 mg (4 mg Intravenous Given 05/04/23 0111)  sodium chloride 0.9 % bolus 1,000 mL (1,000 mLs Intravenous New Bag/Given 05/04/23 0112)  fentaNYL (SUBLIMAZE) injection 50 mcg (50 mcg Intravenous Given 05/04/23 0111)    ED Course/ Medical Decision Making/ A&P             HEART Score: 1                Medical Decision Making Amount and/or Complexity of Data Reviewed Labs: ordered. Radiology: ordered.  Risk Prescription drug management.   This patient presents to the ED for concern of chest pain, this involves an extensive number of treatment options, and is a complaint that carries with it a high risk of complications and morbidity.  The differential diagnosis includes ACS, PE, dissection, pneumonia, anxiety, musculoskeletal pain, others   Co morbidities that complicate the patient evaluation  History of sinus tachycardia    Lab Tests:  I Ordered, and personally interpreted labs.  The pertinent results include: Glucose 291, unremarkable CBC, troponin less than 2, negative pregnancy test, D-dimer 0.28   Imaging Studies ordered:  I ordered imaging studies including chest x-ray I  independently visualized and interpreted imaging which showed no active disease I agree with the radiologist interpretation   Cardiac Monitoring: / EKG:  The patient was maintained on a cardiac monitor.  I personally viewed and interpreted the cardiac monitored which showed an underlying rhythm of: Sinus rhythm    Problem List / ED Course / Critical interventions / Medication management   I ordered medication including fentanyl for chest pain, Zofran for nausea   Reevaluation of the patient after these medicines showed that the patient improved I have reviewed the patients home medicines and have made adjustments as needed   Social Determinants of Health:  Patient has Medicaid for her primary insurance type   Test / Admission - Considered:  Patient with negative troponins x 2, nonischemic EKG.  Low heart score.  No sign of ACS at this time.  Negative dimer showing no clot burden.  No pneumonia on chest x-ray.  No clinical signs of dissection.  Patient endorsed possible anxiety contributing to intermittent chest discomfort and this does seem reasonable.  Currently patient states her chest pain is only 2 out of 10 in severity.  No indication for further emergent workup at this time.  Patient does continue to complain of severe mouth pain secondary to her surgery.  Plan to discharge home with short course of Norco to be used instead of the Tylenol 3 she was previously prescribed.  Patient will follow-up with her oral surgeon on Monday if pain continues to be hard to control.  Return precautions for chest pain provided.  No indication at this time for admission.  Discharge home.         Final Clinical Impression(s) / ED Diagnoses Final diagnoses:  Pain, dental  Chest pain, unspecified type    Rx / DC Orders ED Discharge Orders          Ordered    HYDROcodone-acetaminophen (NORCO/VICODIN) 5-325 MG tablet  Every 4 hours PRN        05/04/23 0443              Darrick Grinder, PA-C 05/04/23 0447    Nira Conn, MD 05/04/23 [phone]

## 2023-05-04 NOTE — ED Triage Notes (Signed)
Pt. Arrives POV stating that she had dental surgery Wednesday. Yesterday she started have chest pain and abdominal pain. Pt. States that she is nauseous and had some dry heaving earlier today.

## 2023-05-13 ENCOUNTER — Ambulatory Visit: Payer: Medicaid Other | Admitting: Adult Health

## 2023-06-25 ENCOUNTER — Other Ambulatory Visit: Payer: Self-pay

## 2023-06-25 ENCOUNTER — Emergency Department (HOSPITAL_BASED_OUTPATIENT_CLINIC_OR_DEPARTMENT_OTHER)
Admission: EM | Admit: 2023-06-25 | Discharge: 2023-06-25 | Disposition: A | Discharge status: Home / Self Care | Payer: Medicaid Other | Attending: Emergency Medicine | Admitting: Emergency Medicine

## 2023-06-25 ENCOUNTER — Emergency Department (HOSPITAL_BASED_OUTPATIENT_CLINIC_OR_DEPARTMENT_OTHER): Payer: Medicaid Other | Admitting: Radiology

## 2023-06-25 DIAGNOSIS — Z794 Long term (current) use of insulin: Secondary | ICD-10-CM | POA: Diagnosis not present

## 2023-06-25 DIAGNOSIS — R55 Syncope and collapse: Secondary | ICD-10-CM

## 2023-06-25 DIAGNOSIS — E1165 Type 2 diabetes mellitus with hyperglycemia: Secondary | ICD-10-CM | POA: Diagnosis not present

## 2023-06-25 DIAGNOSIS — R739 Hyperglycemia, unspecified: Secondary | ICD-10-CM

## 2023-06-25 LAB — BASIC METABOLIC PANEL
Anion gap: 12 (ref 5–15)
BUN: 12 mg/dL (ref 6–20)
CO2: 26 mmol/L (ref 22–32)
Calcium: 9.3 mg/dL (ref 8.9–10.3)
Chloride: 100 mmol/L (ref 98–111)
Creatinine, Ser: 0.49 mg/dL (ref 0.44–1.00)
GFR, Estimated: >60 mL/min (ref >60)
Glucose, Bld: 283 mg/dL — ABNORMAL HIGH (ref 70–99)
Potassium: 3.4 mmol/L — ABNORMAL LOW (ref 3.5–5.1)
Sodium: 138 mmol/L (ref 135–145)

## 2023-06-25 LAB — D-DIMER, QUANTITATIVE: D-Dimer, Quant: <0.27 ug{FEU}/mL (ref 0.00–0.50)

## 2023-06-25 LAB — CBC
HCT: 35.4 % — ABNORMAL LOW (ref 36.0–46.0)
Hemoglobin: 11.9 g/dL — ABNORMAL LOW (ref 12.0–15.0)
MCH: 28.7 pg (ref 26.0–34.0)
MCHC: 33.6 g/dL (ref 30.0–36.0)
MCV: 85.5 fL (ref 80.0–100.0)
Platelets: 465 10*3/uL — ABNORMAL HIGH (ref 150–400)
RBC: 4.14 MIL/uL (ref 3.87–5.11)
RDW: 13.1 % (ref 11.5–15.5)
WBC: 7.5 10*3/uL (ref 4.0–10.5)
nRBC: 0 % (ref 0.0–0.2)

## 2023-06-25 LAB — CBG MONITORING, ED: Glucose-Capillary: 236 mg/dL — ABNORMAL HIGH (ref 70–99)

## 2023-06-25 LAB — TROPONIN I (HIGH SENSITIVITY)
Troponin I (High Sensitivity): <2 ng/L (ref <18)
Troponin I (High Sensitivity): <2 ng/L (ref <18)

## 2023-06-25 MED ORDER — LACTATED RINGERS IV BOLUS
1000.0000 mL | Freq: Once | INTRAVENOUS | Status: AC
  Administered 2023-06-25: 1000 mL via INTRAVENOUS

## 2023-06-25 MED ORDER — POTASSIUM CHLORIDE CRYS ER 20 MEQ PO TBCR
40.0000 meq | EXTENDED_RELEASE_TABLET | Freq: Once | ORAL | Status: AC
  Administered 2023-06-25: 40 meq via ORAL
  Filled 2023-06-25: qty 2

## 2023-06-25 NOTE — ED Provider Notes (Signed)
[facility] AT [facility] Provider Note   CSN: [id number] Arrival date & time: 06/25/23  1840     History  Chief Complaint  Patient presents with   Chest Pain    Evelyn [name] is a 37 y.o. female.  Patient is a 37 year old female with a past medical history of diabetes of medication noncompliance presenting to the emergency department with dizziness and syncope.  The patient states on Sunday she was walking when she became lightheaded and passed out.  She states over the last 2 days she has had recurrent episodes of lightheadedness without passing out.  She states that she had some mild chest discomfort but denies any shortness of breath.  She denies any recent nausea, vomiting or diarrhea.  She states that she has not had any fevers or other sick symptoms.  She denies any family history of early cardiac disease or sudden unexplained death.  He denies any history of blood clots, recent hospitalization or surgery, recent long travel in the car or plane, hormone use or cancer history.  The history is provided by the patient.  Chest Pain      Home Medications Prior to Admission medications   Medication Sig Start Date End Date Taking? Authorizing Provider  HYDROcodone-acetaminophen (NORCO/VICODIN) 5-325 MG tablet Take 1 tablet by mouth every 4 (four) hours as needed for severe pain. 05/04/23   Darrick Grinder, PA-C  ibuprofen (ADVIL) 200 MG tablet Take 200 mg by mouth every 6 (six) hours as needed for headache.    [provider]  insulin glargine (LANTUS) 100 UNIT/ML injection Inject 0.2 mLs (20 Units total) into the skin 2 (two) times daily. Patient taking differently: Inject 20-60 Units into the skin 2 (two) times daily. Pt stated she doesn't have a specific sliding scale. 11/20/21   Hollice Espy, MD  insulin lispro (HUMALOG KWIKPEN) 100 UNIT/ML KwikPen Inject 0-15 Units into the skin 3 (three) times daily. CBG 70 - 120: 0 units CBG 121 -  150: 2 units CBG 151 - 200: 3 units CBG 201 - 250: 5 units CBG 251 - 300: 8 units CBG 301 - 350: 11 units CBG 351 - 400: 15 units 06/28/22   Joseph Art, DO      Allergies    Morphine and codeine    Review of Systems   Review of Systems  Cardiovascular:  Positive for chest pain.    Physical Exam Updated Vital Signs BP 119/81   Pulse 86   Temp 98 F (36.7 C) (Oral)   Resp 16   Ht 5\' 3"  (1.6 m)   Wt 61.2 kg   SpO2 100%   BMI 23.91 kg/m  Physical Exam Vitals and nursing note reviewed.  Constitutional:      General: She is not in acute distress.    Appearance: She is well-developed.  HENT:     Head: Normocephalic.  Eyes:     Extraocular Movements: Extraocular movements intact.  Cardiovascular:     Rate and Rhythm: Normal rate and regular rhythm.     Heart sounds: Normal heart sounds.  Pulmonary:     Effort: Pulmonary effort is normal.     Breath sounds: Normal breath sounds.  Abdominal:     Palpations: Abdomen is soft.     Tenderness: There is no abdominal tenderness.  Musculoskeletal:        General: Normal range of motion.     Cervical back: Normal range of motion and neck  supple.     Right lower leg: No edema.     Left lower leg: No edema.  Skin:    General: Skin is warm and dry.  Neurological:     General: No focal deficit present.     Mental Status: She is alert and oriented to person, place, and time.  Psychiatric:        Mood and Affect: Mood normal.        Behavior: Behavior normal.     ED Results / Procedures / Treatments   Labs (all labs ordered are listed, but only abnormal results are displayed) Labs Reviewed  BASIC METABOLIC PANEL - Abnormal; Notable for the following components:      Result Value   Potassium 3.4 (*)    Glucose, Bld 283 (*)    All other components within normal limits  CBC - Abnormal; Notable for the following components:   Hemoglobin 11.9 (*)    HCT 35.4 (*)    Platelets 465 (*)    All other components within normal  limits  CBG MONITORING, ED - Abnormal; Notable for the following components:   Glucose-Capillary 236 (*)    All other components within normal limits  D-DIMER, QUANTITATIVE  PREGNANCY, URINE  TROPONIN I (HIGH SENSITIVITY)  TROPONIN I (HIGH SENSITIVITY)    EKG EKG Interpretation Date/Time:  Tuesday June 25 2023 18:50:15 EDT Ventricular Rate:  94 PR Interval:  134 QRS Duration:  76 QT Interval:  352 QTC Calculation: 440 R Axis:   41  Text Interpretation: Normal sinus rhythm Possible Left atrial enlargement Borderline ECG No significant change since last tracing Confirmed by Elayne Snare ([id number]) on 06/25/2023 9:20:58 PM  Radiology DG Chest 2 View  Result Date: 06/25/2023 CLINICAL DATA:  Chest pain. Syncopal episode on Sunday. Faint feeling is again yesterday. Ringing in the ears and dizziness. Today also left chest pressure. EXAM: CHEST - 2 VIEW COMPARISON:  05/04/2023 FINDINGS: The heart size and mediastinal contours are within normal limits. Both lungs are clear. The visualized skeletal structures are unremarkable. IMPRESSION: No active cardiopulmonary disease. Electronically Signed   By: Burman Nieves M.D.   On: 06/25/2023 20:59    Procedures Procedures    Medications Ordered in ED Medications  potassium chloride SA (KLOR-CON M) CR tablet 40 mEq (40 mEq Oral Given 06/25/23 2148)  lactated ringers bolus 1,000 mL (0 mLs Intravenous Stopped 06/25/23 2251)    ED Course/ Medical Decision Making/ A&P Clinical Course as of 06/25/23 2311  Tue Jun 25, 2023  2306 D-dimer negative and repeat troponin negative.  Orthostatics were essentially normal.  She remains asymptomatic and is stable for discharge home but will be given cardiology follow-up and was given strict return precautions. [VK]    Clinical Course User Index [VK] Rexford Maus, DO                                 Medical Decision Making This patient presents to the ED with chief complaint(s) of syncope with  pertinent past medical history of DM which further complicates the presenting complaint. The complaint involves an extensive differential diagnosis and also carries with it a high risk of complications and morbidity.    The differential diagnosis includes arrhythmia, anemia, ACS, hypo or hyperglycemia, electrolyte abnormality, dehydration, orthostatics, considering PE with her tachycardia and she is otherwise low risk by Wells criteria  Additional history obtained: Additional history obtained from family Records  reviewed N/A  ED Course and Reassessment: On patient's arrival she was hemodynamically stable, mildly tachycardic but in no acute distress.  EKG on arrival showed normal sinus rhythm without acute ischemic changes.  The patient was initially evaluated by triage and had labs performed that showed mild hypokalemia that was repleted and mild hyperglycemia without evidence of DKA.  Initial troponin was normal.  With her tachycardia on arrival will have D-dimer.  She will have orthostatics and will be given IV fluids and will be closely reassessed.  Independent labs interpretation:  The following labs were independently interpreted: Hyperglycemia without DKA, mild hypokalemia  Independent visualization of imaging: - I independently visualized the following imaging with scope of interpretation limited to determining acute life threatening conditions related to emergency care: Chest x-ray, which revealed no acute disease  Consultation: - Consulted or discussed management/test interpretation w/ external professional: N/A  Consideration for admission or further workup: Patient has no emergent conditions requiring admission or further work-up at this time and is stable for discharge home with primary care/cardiology follow-up  Social Determinants of health: N/A    Amount and/or Complexity of Data Reviewed Labs: ordered. Radiology: ordered.  Risk Prescription drug  management.          Final Clinical Impression(s) / ED Diagnoses Final diagnoses:  Syncope, unspecified syncope type  Hyperglycemia    Rx / DC Orders ED Discharge Orders          Ordered    Ambulatory referral to Cardiology       Comments: If you have not heard from the Cardiology office within the next 72 hours please call [phone].   06/25/23 2310              Rexford Maus, DO 06/25/23 2311

## 2023-06-25 NOTE — ED Triage Notes (Signed)
Sunday experienced syncopal episode. Yesterday had faint feelings again- ringing ears, dizzy. Today felt the same accompanied by L chest pressure.

## 2023-06-25 NOTE — ED Notes (Signed)
Pt verbalized understanding of d/c instructions, meds, and followup care. Denies questions. VSS, no distress noted. Steady gait to exit with all belongings.  ?

## 2023-06-25 NOTE — Discharge Instructions (Signed)
You were seen in the emergency department for your dizziness and episode of passing out.  You were in a normal heart rhythm here and had no signs of severe anemia or dehydration or signs of blood clots to explain your symptoms.  Your blood sugar was mildly elevated here and your potassium level is mildly low.  We did give you fluids and potassium repletion.  You can follow-up with your primary doctor to have your blood sugar rechecked and for further blood sugar management.  Due to your recurrent episodes of dizziness and passing out I have given you a cardiology referral and they should be calling you within the next few days to schedule a follow-up appointment to have further workup.  You should return to the emergency department if you have recurrent episodes of passing out, significantly worsening chest pain, severe shortness of breath or if you have any other new or concerning symptoms.

## 2023-07-08 ENCOUNTER — Encounter: Payer: Self-pay | Admitting: Cardiovascular Disease

## 2023-07-08 ENCOUNTER — Ambulatory Visit: Payer: Medicaid Other | Attending: Cardiovascular Disease | Admitting: Cardiovascular Disease

## 2023-07-08 VITALS — BP 119/81 | HR 77 | Ht 63.0 in | Wt 136.8 lb

## 2023-07-08 DIAGNOSIS — R55 Syncope and collapse: Secondary | ICD-10-CM | POA: Diagnosis not present

## 2023-07-08 DIAGNOSIS — E1065 Type 1 diabetes mellitus with hyperglycemia: Secondary | ICD-10-CM | POA: Diagnosis not present

## 2023-07-08 NOTE — Patient Instructions (Signed)
Medication Instructions:  No changes *If you need a refill on your cardiac medications before your next appointment, please call your pharmacy*  Follow-Up: At [facility], you and your health needs are our priority.  As part of our continuing mission to provide you with exceptional heart care, we have created designated Provider Care Teams.  These Care Teams include your primary Cardiologist (physician) and Advanced Practice Providers (APPs -  Physician Assistants and Nurse Practitioners) who all work together to provide you with the care you need, when you need it.  We recommend signing up for the patient portal called "MyChart".  Sign up information is provided on this After Visit Summary.  MyChart is used to connect with patients for Virtual Visits (Telemedicine).  Patients are able to view lab/test results, encounter notes, upcoming appointments, etc.  Non-urgent messages can be sent to your provider as well.   To learn more about what you can do with MyChart, go to [email or web address].    Your next appointment:    Follow up as needed  Provider:   Dr Royann Shivers   Syncope, Adult  Syncope refers to a condition in which a person temporarily loses consciousness. Syncope may also be called fainting or passing out. It is caused by a sudden decrease in blood flow to the brain. This can happen for a variety of reasons. Most causes of syncope are not dangerous. It can be triggered by things such as needle sticks, seeing blood, pain, or intense emotion. However, syncope can also be a sign of a serious medical problem, such as a heart abnormality. Other causes can include dehydration, migraines, or taking medicines that lower blood pressure. Your health care provider may do tests to find the reason why you are having syncope. If you faint, get medical help right away. Call your local emergency services (911 in the [place] Follow these instructions at home: Pay attention to any changes in  your symptoms. Take these actions to stay safe and to help relieve your symptoms: Knowing when you may be about to faint Signs that you may be about to faint include: Feeling dizzy, weak, light-headed, or like the room is spinning. Feeling nauseous. Seeing spots or seeing all white or all black in your field of vision. Having cold, clammy skin or feeling warm and sweaty. Hearing ringing in the ears (tinnitus). If you start to feel like you might faint, sit or lie down right away. If sitting, put your head down between your legs. If lying down, raise (elevate) your feet above the level of your heart. Breathe deeply and steadily. Wait until all the symptoms have passed. Have someone stay with you until you feel stable. Medicines Take over-the-counter and prescription medicines only as told by your health care provider. If you are taking blood pressure or heart medicine, get up slowly and take several minutes to sit and then stand. This can reduce dizziness and decrease the risk of syncope. Lifestyle Do not drive, use machinery, or play sports until your health care provider says it is okay. Do not drink alcohol. Do not use any products that contain nicotine or tobacco. These products include cigarettes, chewing tobacco, and vaping devices, such as e-cigarettes. If you need help quitting, ask your health care provider. Avoid hot tubs and saunas. General instructions Talk with your health care provider about your symptoms. You may need to have testing to understand the cause of your syncope. Drink enough fluid to keep your urine pale yellow. Avoid prolonged standing.  If you must stand for a long time, do movements such as: Moving your legs. Crossing your legs. Flexing and stretching your leg muscles. Squatting. Keep all follow-up visits. This is important. Contact a health care provider if: You have episodes of near fainting. Get help right away if: You faint. You hit your head or are  injured after fainting. You have any of these symptoms that may indicate trouble with your heart: Fast or irregular heartbeats (palpitations). Unusual pain in your chest, abdomen, or back. Shortness of breath. You have a seizure. You have a severe headache. You are confused. You have vision problems. You have severe weakness or trouble walking. You are bleeding from your mouth or rectum, or you have black or tarry stool. These symptoms may represent a serious problem that is an emergency. Do not wait to see if your symptoms will go away. Get medical help right away. Call your local emergency services (911 in the [place] Do not drive yourself to the hospital. Summary Syncope refers to a condition in which a person temporarily loses consciousness. Syncope may also be called fainting or passing out. It is caused by a sudden decrease in blood flow to the brain. Signs that you may be about to faint include dizziness, feeling light-headed, feeling nauseous, sudden vision changes, or cold, clammy skin. Even though most causes of syncope are not dangerous, syncope can be a sign of a serious medical problem. Get help right away if you faint. If you start to feel like you might faint, sit or lie down right away. If sitting, put your head down between your legs. If lying down, raise (elevate) your feet above the level of your heart. This information is not intended to replace advice given to you by your health care provider. Make sure you discuss any questions you have with your health care provider. Document Revised: 02/16/2021 Document Reviewed: 02/16/2021 Elsevier Patient Education  2024 [employer].

## 2023-07-08 NOTE — Progress Notes (Unsigned)
Cardiology Office Note:    Date:  07/09/2023   ID:  Evelyn [name], DOB 22-May-1986, MRN [id number]  PCP:  [facility], [facility]   [facility] Providers Cardiologist:  None     Referring MD: [facility], [facility]   Chief Complaint  Patient presents with   Loss of Consciousness         History of Present Illness:    Evelyn [name] is a 37 y.o. female with a hx of insulin requiring diabetes mellitus, complicated by retinopathy, but seen in the emergency room 06/25/2023 for a syncopal event.  She has actually had recurrent episodes of lightheadedness, without passing out until the events that occurred on September 3.  She was standing on the porch at the time.  She had spent most of that day outside on a boat and had not had much water to drink.  She did have a beer.  She had a feeling of lightheadedness and mild chest discomfort, but did not have nausea, vomiting, shortness of breath or palpitations before losing consciousness.  She had enough time to tell her friend that she was feeling poorly and try to hold onto a post on the porch before passing out.  Her friends told her that she slowly buckled to the ground, she did not appear to immediately pass out.  When she woke up she was drenched in sweat.  She had not had any preceding illnesses, but her glucose has been running high for a long time.  Workup in the emergency room was significant for moderate hyperglycemia (glucose 283) and mild hypokalemia (potassium 3.4).  Her ECG was normal and did not show ischemic changes.  Consecutive high-sensitivity troponin assays were normal.  D-dimer was negative.  There was no evidence of orthostatic hypotension.  Chest x-ray was negative.  She was treated with IV fluids and she has not had dizziness or syncope since then.  05/04/2023 she was also evaluated for chest pain.  Likewise, her biomarkers and ECG was low risk.  This occurred not long after she had dental surgery with removal of  14 teeth.  Diabetes mellitus was diagnosed at age 84 during her first pregnancy.  She was treated with insulin during her second pregnancy.  Recently her primary care provider has recommended discontinuation of her insulin because he thinks she has type 2 diabetes.  She is very lean and has never been obese.  Both her parents have diabetes and her mother had high blood pressure, but there is no family history of early onset coronary or peripheral vascular disease.  She smoked half a pack a day for 6 years and quit in 2014.  Past Medical History:  Diagnosis Date   Diabetes mellitus without complication (HCC)    Diagnosed in 2009; on insulin   Heartburn in pregnancy    History of trichomoniasis    Infection    UTI   Migraine    otc med prn    Past Surgical History:  Procedure Laterality Date   CESAREAN SECTION  2009   CESAREAN SECTION N/A 08/04/2013   Procedure: REPEAT CESAREAN SECTION;  Surgeon: Oliver Pila, MD;  Location: [facility];  Service: Obstetrics;  Laterality: N/A;   DENTAL SURGERY     wisdom teeth ext    Current Medications: Current Meds  Medication Sig   ibuprofen (ADVIL) 200 MG tablet Take 200 mg by mouth every 6 (six) hours as needed for headache.     Allergies:   Morphine  and codeine   Social History   Socioeconomic History   Marital status: Single    Spouse name: Not on file   Number of children: Not on file   Years of education: Not on file   Highest education level: Not on file  Occupational History   Not on file  Tobacco Use   Smoking status: Some Days    Current packs/day: 0.00    Average packs/day: 0.5 packs/day for 6.0 years (3.0 ttl pk-yrs)    Types: Cigarettes    Start date: 10/22/2006    Last attempt to quit: 10/22/2012    Years since quitting: 10.7   Smokeless tobacco: Never   Tobacco comments:    07/08/2023 Patient states she smokes when she drinks and its not often  Vaping Use   Vaping status: Never Used  Substance and Sexual Activity    Alcohol use: Yes    Comment: Occasional   Drug use: No   Sexual activity: Yes    Birth control/protection: None  Other Topics Concern   Not on file  Social History Narrative   Not on file   Social Determinants of Health   Financial Resource Strain: Low Risk  (10/01/2022)   Overall Financial Resource Strain (CARDIA)    Difficulty of Paying Living Expenses: Not hard at all  Food Insecurity: No Food Insecurity (10/01/2022)   Hunger Vital Sign    Worried About Running Out of Food in the Last Year: Never true    Ran Out of Food in the Last Year: Never true  Transportation Needs: No Transportation Needs (10/01/2022)   PRAPARE - Administrator, Civil Service (Medical): No    Lack of Transportation (Non-Medical): No  Physical Activity: Inactive (10/01/2022)   Exercise Vital Sign    Days of Exercise per Week: 0 days    Minutes of Exercise per Session: 0 min  Stress: Stress Concern Present (10/01/2022)   [employer] of Occupational Health - Occupational Stress Questionnaire    Feeling of Stress : To some extent  Social Connections: Socially Integrated (10/01/2022)   Social Connection and Isolation Panel [NHANES]    Frequency of Communication with Friends and Family: More than three times a week    Frequency of Social Gatherings with Friends and Family: More than three times a week    Attends Religious Services: 1 to 4 times per year    Active Member of [employer] or Organizations: Yes    Attends Banker Meetings: 1 to 4 times per year    Marital Status: Married     Family History: The patient's family history includes Alzheimer's disease in her maternal grandfather; Birth defects in her sister; Cancer in her maternal grandmother and paternal grandmother; Diabetes in her father and mother; Hypertension in her mother.  ROS:   Please see the history of present illness.     All other systems reviewed and are negative.  EKGs/Labs/Other Studies Reviewed:     The following studies were reviewed today:  EKG Interpretation Date/Time:  Monday July 08 2023 13:33:11 EDT Ventricular Rate:  77 PR Interval:  134 QRS Duration:  88 QT Interval:  370 QTC Calculation: 418 R Axis:   46  Text Interpretation: Normal sinus rhythm Normal ECG When compared with ECG of 25-Jun-2023 18:50, No significant change was found Confirmed by Rand Boller [phone]) on 07/08/2023 1:38:40 PM    Recent Labs: 09/25/2022: ALT 11 06/25/2023: BUN 12; Creatinine, Ser 0.49; Hemoglobin 11.9; Platelets 465; Potassium 3.4;  Sodium 138  Hemoglobin A1c 9.7% Recent Lipid Panel No results found for: "CHOL", "TRIG", "HDL", "CHOLHDL", "VLDL", "LDLCALC", "LDLDIRECT"   Risk Assessment/Calculations:                Physical Exam:    VS:  BP 119/81 (BP Location: Left Arm, Patient Position: Sitting, Cuff Size: Normal)   Pulse 77   Ht 5\' 3"  (1.6 m)   Wt 136 lb 12.8 oz (62.1 kg)   SpO2 96%   BMI 24.23 kg/m     Wt Readings from Last 3 Encounters:  07/08/23 136 lb 12.8 oz (62.1 kg)  06/25/23 135 lb (61.2 kg)  05/04/23 130 lb (59 kg)     GEN: Lean, well nourished, well developed in no acute distress HEENT: Normal NECK: No JVD; No carotid bruits LYMPHATICS: No lymphadenopathy CARDIAC: RRR, no murmurs, rubs, gallops RESPIRATORY:  Clear to auscultation without rales, wheezing or rhonchi  ABDOMEN: Soft, non-tender, non-distended MUSCULOSKELETAL:  No edema; No deformity  SKIN: Warm and dry NEUROLOGIC:  Alert and oriented x 3 PSYCHIATRIC:  Normal affect   ASSESSMENT:    1. Syncope, unspecified syncope type    PLAN:    In order of problems listed above:  Syncope: This does not sound like arrhythmic syncope.  I suspect she had syncope due to orthostatic hypotension with or without neurally mediated mechanism.  She has poorly controlled glucose levels, had been prescribed an SGLT2 inhibitor, have not had enough water to drink on a hot day.  She has not had any recurrent  events since receiving IV fluids in the emergency room.  There is no evidence of structural cardiac abnormalities on her physical exam.  Recommended aggressive hydration throughout the day, a relatively bit of low intake of symptoms in her diet, at least her glucose is better controlled, avoiding standing up without needing prolonged periods of time, paying attention to potential triggers of syncope, possibly laying flat when she has prodromal symptoms.  She has a smart watch with ECG capabilities.  If she feels prodromal/presyncopal complaints of recommend checking a tracing and sending it to me through MyChart. DM: This is poorly controlled with an A1c of 9.7%.  We discussed the fact that SGLT2 inhibitors cause diuresis which can be a very large volume if administered when the blood glucose is poorly controlled.  I think it is important to define whether she has type I or type 2 diabetes mellitus.  Diabetes onset was at young age and she is quite lean, which would be more in keeping with type 1 diabetes.  On her as she has a strong family history of type 2 diabetes mellitus in both parents.  I think it is safer to assume she has type 1 diabetes mellitus until we clarify the diagnosis.  I would recommend checking a C-peptide and follow-up with an endocrinologist.  She is scheduled to see a new primary care provider next week.        Medication Adjustments/Labs and Tests Ordered: Current medicines are reviewed at length with the patient today.  Concerns regarding medicines are outlined above.  Orders Placed This Encounter  Procedures   EKG 12-Lead   No orders of the defined types were placed in this encounter.   Patient Instructions  Medication Instructions:  No changes *If you need a refill on your cardiac medications before your next appointment, please call your pharmacy*  Follow-Up: At [facility], you and your health needs are our priority.  As part of  our continuing mission to  provide you with exceptional heart care, we have created designated Provider Care Teams.  These Care Teams include your primary Cardiologist (physician) and Advanced Practice Providers (APPs -  Physician Assistants and Nurse Practitioners) who all work together to provide you with the care you need, when you need it.  We recommend signing up for the patient portal called "MyChart".  Sign up information is provided on this After Visit Summary.  MyChart is used to connect with patients for Virtual Visits (Telemedicine).  Patients are able to view lab/test results, encounter notes, upcoming appointments, etc.  Non-urgent messages can be sent to your provider as well.   To learn more about what you can do with MyChart, go to [email or web address].    Your next appointment:    Follow up as needed  Provider:   Dr Royann Shivers   Syncope, Adult  Syncope refers to a condition in which a person temporarily loses consciousness. Syncope may also be called fainting or passing out. It is caused by a sudden decrease in blood flow to the brain. This can happen for a variety of reasons. Most causes of syncope are not dangerous. It can be triggered by things such as needle sticks, seeing blood, pain, or intense emotion. However, syncope can also be a sign of a serious medical problem, such as a heart abnormality. Other causes can include dehydration, migraines, or taking medicines that lower blood pressure. Your health care provider may do tests to find the reason why you are having syncope. If you faint, get medical help right away. Call your local emergency services (911 in the [place] Follow these instructions at home: Pay attention to any changes in your symptoms. Take these actions to stay safe and to help relieve your symptoms: Knowing when you may be about to faint Signs that you may be about to faint include: Feeling dizzy, weak, light-headed, or like the room is spinning. Feeling nauseous. Seeing spots or  seeing all white or all black in your field of vision. Having cold, clammy skin or feeling warm and sweaty. Hearing ringing in the ears (tinnitus). If you start to feel like you might faint, sit or lie down right away. If sitting, put your head down between your legs. If lying down, raise (elevate) your feet above the level of your heart. Breathe deeply and steadily. Wait until all the symptoms have passed. Have someone stay with you until you feel stable. Medicines Take over-the-counter and prescription medicines only as told by your health care provider. If you are taking blood pressure or heart medicine, get up slowly and take several minutes to sit and then stand. This can reduce dizziness and decrease the risk of syncope. Lifestyle Do not drive, use machinery, or play sports until your health care provider says it is okay. Do not drink alcohol. Do not use any products that contain nicotine or tobacco. These products include cigarettes, chewing tobacco, and vaping devices, such as e-cigarettes. If you need help quitting, ask your health care provider. Avoid hot tubs and saunas. General instructions Talk with your health care provider about your symptoms. You may need to have testing to understand the cause of your syncope. Drink enough fluid to keep your urine pale yellow. Avoid prolonged standing. If you must stand for a long time, do movements such as: Moving your legs. Crossing your legs. Flexing and stretching your leg muscles. Squatting. Keep all follow-up visits. This is important. Contact a health care provider if: You  have episodes of near fainting. Get help right away if: You faint. You hit your head or are injured after fainting. You have any of these symptoms that may indicate trouble with your heart: Fast or irregular heartbeats (palpitations). Unusual pain in your chest, abdomen, or back. Shortness of breath. You have a seizure. You have a severe headache. You are  confused. You have vision problems. You have severe weakness or trouble walking. You are bleeding from your mouth or rectum, or you have black or tarry stool. These symptoms may represent a serious problem that is an emergency. Do not wait to see if your symptoms will go away. Get medical help right away. Call your local emergency services (911 in the [place] Do not drive yourself to the hospital. Summary Syncope refers to a condition in which a person temporarily loses consciousness. Syncope may also be called fainting or passing out. It is caused by a sudden decrease in blood flow to the brain. Signs that you may be about to faint include dizziness, feeling light-headed, feeling nauseous, sudden vision changes, or cold, clammy skin. Even though most causes of syncope are not dangerous, syncope can be a sign of a serious medical problem. Get help right away if you faint. If you start to feel like you might faint, sit or lie down right away. If sitting, put your head down between your legs. If lying down, raise (elevate) your feet above the level of your heart. This information is not intended to replace advice given to you by your health care provider. Make sure you discuss any questions you have with your health care provider. Document Revised: 02/16/2021 Document Reviewed: 02/16/2021 Elsevier Patient Education  2024 Elsevier Inc.        Signed, Thurmon Fair, MD  07/09/2023 3:19 PM    [facility]

## 2023-07-16 ENCOUNTER — Ambulatory Visit: Payer: Medicaid Other | Admitting: Family Medicine

## 2024-04-15 ENCOUNTER — Emergency Department (HOSPITAL_BASED_OUTPATIENT_CLINIC_OR_DEPARTMENT_OTHER)
Admission: EM | Admit: 2024-04-15 | Discharge: 2024-04-15 | Disposition: A | Discharge status: Home / Self Care | Attending: Emergency Medicine | Admitting: Emergency Medicine

## 2024-04-15 ENCOUNTER — Emergency Department (HOSPITAL_BASED_OUTPATIENT_CLINIC_OR_DEPARTMENT_OTHER)

## 2024-04-15 ENCOUNTER — Encounter (HOSPITAL_BASED_OUTPATIENT_CLINIC_OR_DEPARTMENT_OTHER): Payer: Self-pay

## 2024-04-15 ENCOUNTER — Other Ambulatory Visit: Payer: Self-pay

## 2024-04-15 DIAGNOSIS — Z794 Long term (current) use of insulin: Secondary | ICD-10-CM | POA: Diagnosis not present

## 2024-04-15 DIAGNOSIS — E1165 Type 2 diabetes mellitus with hyperglycemia: Secondary | ICD-10-CM | POA: Insufficient documentation

## 2024-04-15 DIAGNOSIS — R55 Syncope and collapse: Secondary | ICD-10-CM | POA: Diagnosis present

## 2024-04-15 LAB — D-DIMER, QUANTITATIVE: D-Dimer, Quant: <0.27 ug{FEU}/mL (ref 0.00–0.50)

## 2024-04-15 LAB — CBC WITH DIFFERENTIAL/PLATELET
Abs Immature Granulocytes: 0.03 10*3/uL (ref 0.00–0.07)
Basophils Absolute: 0 10*3/uL (ref 0.0–0.1)
Basophils Relative: 1 %
Eosinophils Absolute: 0 10*3/uL (ref 0.0–0.5)
Eosinophils Relative: 1 %
HCT: 36 % (ref 36.0–46.0)
Hemoglobin: 12.1 g/dL (ref 12.0–15.0)
Immature Granulocytes: 0 %
Lymphocytes Relative: 37 %
Lymphs Abs: 3.2 10*3/uL (ref 0.7–4.0)
MCH: 28 pg (ref 26.0–34.0)
MCHC: 33.6 g/dL (ref 30.0–36.0)
MCV: 83.3 fL (ref 80.0–100.0)
Monocytes Absolute: 0.4 10*3/uL (ref 0.1–1.0)
Monocytes Relative: 5 %
Neutro Abs: 5 10*3/uL (ref 1.7–7.7)
Neutrophils Relative %: 56 %
Platelets: 429 10*3/uL — ABNORMAL HIGH (ref 150–400)
RBC: 4.32 MIL/uL (ref 3.87–5.11)
RDW: 13.8 % (ref 11.5–15.5)
WBC: 8.7 10*3/uL (ref 4.0–10.5)
nRBC: 0 % (ref 0.0–0.2)

## 2024-04-15 LAB — BASIC METABOLIC PANEL WITH GFR
Anion gap: 13 (ref 5–15)
BUN: 14 mg/dL (ref 6–20)
CO2: 22 mmol/L (ref 22–32)
Calcium: 9.7 mg/dL (ref 8.9–10.3)
Chloride: 102 mmol/L (ref 98–111)
Creatinine, Ser: 0.5 mg/dL (ref 0.44–1.00)
GFR, Estimated: >60 mL/min (ref >60)
Glucose, Bld: 252 mg/dL — ABNORMAL HIGH (ref 70–99)
Potassium: 3.6 mmol/L (ref 3.5–5.1)
Sodium: 137 mmol/L (ref 135–145)

## 2024-04-15 LAB — HCG, SERUM, QUALITATIVE: Preg, Serum: NEGATIVE

## 2024-04-15 LAB — TROPONIN T, HIGH SENSITIVITY: Troponin T High Sensitivity: <15 ng/L (ref <19)

## 2024-04-15 MED ORDER — FAMOTIDINE 20 MG PO TABS
20.0000 mg | ORAL_TABLET | Freq: Once | ORAL | Status: AC
  Administered 2024-04-15: 20 mg via ORAL
  Filled 2024-04-15: qty 1

## 2024-04-15 MED ORDER — ALUM & MAG HYDROXIDE-SIMETH 200-200-20 MG/5ML PO SUSP
30.0000 mL | Freq: Once | ORAL | Status: AC
  Administered 2024-04-15: 30 mL via ORAL
  Filled 2024-04-15: qty 30

## 2024-04-15 MED ORDER — ACETAMINOPHEN 500 MG PO TABS
1000.0000 mg | ORAL_TABLET | Freq: Once | ORAL | Status: AC
  Administered 2024-04-15: 1000 mg via ORAL
  Filled 2024-04-15: qty 2

## 2024-04-15 MED ORDER — SODIUM CHLORIDE 0.9 % IV BOLUS
1000.0000 mL | Freq: Once | INTRAVENOUS | Status: AC
  Administered 2024-04-15: 1000 mL via INTRAVENOUS

## 2024-04-15 NOTE — Discharge Instructions (Signed)
 Evaluation today was overall reassuring.  Please follow-up your PCP.  If your symptoms worsen in any way please return to the ED for further evaluation.  At home would recommend assertive rehydration with water and Gatorade.

## 2024-04-15 NOTE — ED Triage Notes (Signed)
 Patient reports near syncopal episode earlier today. Reports her apple watch was saying that her heart rate was 140. Endorses dizziness and chest pressure,

## 2024-04-15 NOTE — ED Provider Notes (Signed)
 [facility] AT [facility] Provider Note   CSN: [id number] Arrival date & time: 04/15/24  1541     Patient presents with: Near Syncope  HPI Evelyn [name] is a 38 y.o. female with history of diabetes, sinus tachycardia migraines presenting for near syncope.  Occurred earlier today.  States has been busy at work today walking to and from different buildings.  She looked down and noticed that her heart rate was 140.  At the time she was feeling lightheaded and noted some chest pressure in the center of her chest.  Denies associated shortness of breath.  Sensation last for few minutes and went away.  She states she still has some lingering chest pressure but it has improved.  Denies OCP use, calf tenderness or recent immobilization.  Denies headache visual disturbance and numbness or weakness in her extremities.    Near Syncope       Prior to Admission medications   Medication Sig Start Date End Date Taking? Authorizing Provider  HYDROcodone -acetaminophen  (NORCO/VICODIN) 5-325 MG tablet Take 1 tablet by mouth every 4 (four) hours as needed for severe pain. Patient not taking: Reported on 07/08/2023 05/04/23   Logan Ubaldo NOVAK, PA-C  ibuprofen  (ADVIL ) 200 MG tablet Take 200 mg by mouth every 6 (six) hours as needed for headache.    [provider]  insulin  glargine (LANTUS ) 100 UNIT/ML injection Inject 0.2 mLs (20 Units total) into the skin 2 (two) times daily. Patient not taking: Reported on 07/08/2023 11/20/21   Krishnan, Sendil K, MD  insulin  lispro (HUMALOG  KWIKPEN) 100 UNIT/ML KwikPen Inject 0-15 Units into the skin 3 (three) times daily. CBG 70 - 120: 0 units CBG 121 - 150: 2 units CBG 151 - 200: 3 units CBG 201 - 250: 5 units CBG 251 - 300: 8 units CBG 301 - 350: 11 units CBG 351 - 400: 15 units Patient not taking: Reported on 07/08/2023 06/28/22   Juvenal Harlene PENNER, DO    Allergies: Morphine  and codeine    Review of Systems  Cardiovascular:   Positive for near-syncope.    Updated Vital Signs BP 116/76   Pulse 78   Temp 98.1 F (36.7 C)   Resp 20   Ht 5' 3 (1.6 m)   Wt 63.5 kg   LMP 04/13/2024 (Approximate)   SpO2 98%   BMI 24.80 kg/m   Physical Exam Vitals and nursing note reviewed.  Constitutional:      Appearance: Normal appearance.  HENT:     Head: Normocephalic and atraumatic.     Nose: Nose normal.     Mouth/Throat:     Mouth: Mucous membranes are moist.   Eyes:     General:        Right eye: No discharge.        Left eye: No discharge.     Conjunctiva/sclera: Conjunctivae normal.    Cardiovascular:     Rate and Rhythm: Normal rate and regular rhythm.     Pulses: Normal pulses.     Heart sounds: Normal heart sounds.  Pulmonary:     Effort: Pulmonary effort is normal.     Breath sounds: Normal breath sounds.  Abdominal:     General: Abdomen is flat.     Palpations: Abdomen is soft.   Skin:    General: Skin is warm and dry.   Neurological:     General: No focal deficit present.     Mental Status: She is alert.  Comments: GCS 15. Speech is goal oriented. No deficits appreciated to CN III-XII; symmetric eyebrow raise, no facial drooping, tongue midline. Patient has equal grip strength bilaterally with 5/5 strength against resistance in all major muscle groups bilaterally. Sensation to light touch intact. Patient moves extremities without ataxia. Normal finger-nose-finger. Patient ambulatory with steady gait.   Psychiatric:        Mood and Affect: Mood normal.     (all labs ordered are listed, but only abnormal results are displayed) Labs Reviewed  CBC WITH DIFFERENTIAL/PLATELET - Abnormal; Notable for the following components:      Result Value   Platelets 429 (*)    All other components within normal limits  BASIC METABOLIC PANEL WITH GFR - Abnormal; Notable for the following components:   Glucose, Bld 252 (*)    All other components within normal limits  HCG, SERUM, QUALITATIVE   D-DIMER, QUANTITATIVE  TROPONIN T, HIGH SENSITIVITY  TROPONIN T, HIGH SENSITIVITY    EKG: EKG Interpretation Date/Time:  Wednesday April 15 2024 15:50:29 EDT Ventricular Rate:  93 PR Interval:  130 QRS Duration:  88 QT Interval:  358 QTC Calculation: 446 R Axis:   62  Text Interpretation: Sinus rhythm Confirmed by Ruthe Cornet [phone]) on 04/15/2024 3:53:13 PM  Radiology: [facility] Chest Port 1 View Result Date: 04/15/2024 CLINICAL DATA:  Syncopal episode. EXAM: PORTABLE CHEST 1 VIEW COMPARISON:  June 25, 2023 FINDINGS: The heart size and mediastinal contours are within normal limits. Both lungs are clear. The visualized skeletal structures are unremarkable. IMPRESSION: No active disease. Electronically Signed   By: Suzen Dials M.D.   On: 04/15/2024 17:18     Procedures   Medications Ordered in the ED  sodium chloride  0.9 % bolus 1,000 mL (1,000 mLs Intravenous New Bag/Given 04/15/24 1742)  acetaminophen  (TYLENOL ) tablet 1,000 mg (1,000 mg Oral Given 04/15/24 1752)  alum & mag hydroxide-simeth (MAALOX/MYLANTA) 200-200-20 MG/5ML suspension 30 mL (30 mLs Oral Given 04/15/24 1751)  famotidine (PEPCID) tablet 20 mg (20 mg Oral Given 04/15/24 1753)    Clinical Course as of 04/15/24 1851  Wed Apr 15, 2024  1847 DG Chest [place] 1 View [JR]    Clinical Course User Index [JR] Lang Norleen POUR, PA-C                                 Medical Decision Making Amount and/or Complexity of Data Reviewed Labs: ordered. Radiology: ordered.  Risk OTC drugs.   Initial Impression and Ddx 38 yo well appearing presented for near syncope.  Exam is unremarkable.  DDx includes arrhythmia, stroke, ACS, PE, reflux, other. Patient PMH that increases complexity of ED encounter:  history of diabetes, sinus tachycardia migraines   Interpretation of Diagnostics - I independent reviewed and interpreted the labs as followed: hyperglycemia (252)  - I independently visualized the following imaging  with scope of interpretation limited to determining acute life threatening conditions related to emergency care: CXR, which revealed no acute findings  - I personally reviewed interpreted EKG which revealed sinus rhythm  Patient Reassessment and Ultimate Disposition/Management On reassessment, patient remained well-appearing and was asymptomatic at that time.  Workup was unremarkable.  Workup does not suggest PE or ACS or stroke.  Advised supportive treatment at home.  Advised PCP follow-up.  Discussed return precautions.  Discharged in good condition.  Patient management required discussion with the following services or consulting groups:  None  Complexity of Problems  Addressed Acute complicated illness or Injury  Additional Data Reviewed and Analyzed Further history obtained from: Past medical history and medications listed in the EMR and Prior ED visit notes  Patient Encounter Risk Assessment Consideration of hospitalization      Final diagnoses:  Near syncope    ED Discharge Orders     None          Lang Norleen POUR, PA-C 04/15/24 1851    Ruthe Cornet, DO 04/15/24 2002

## 2024-10-13 ENCOUNTER — Telehealth: Payer: Self-pay | Admitting: Family Medicine

## 2024-10-13 ENCOUNTER — Ambulatory Visit: Admitting: Family Medicine

## 2024-10-13 ENCOUNTER — Encounter: Payer: Self-pay | Admitting: Family Medicine

## 2024-10-13 VITALS — BP 103/71 | HR 98 | Temp 97.6°F | Ht 63.0 in | Wt 131.6 lb

## 2024-10-13 DIAGNOSIS — Z8639 Personal history of other endocrine, nutritional and metabolic disease: Secondary | ICD-10-CM

## 2024-10-13 DIAGNOSIS — R110 Nausea: Secondary | ICD-10-CM

## 2024-10-13 DIAGNOSIS — E1365 Other specified diabetes mellitus with hyperglycemia: Secondary | ICD-10-CM | POA: Diagnosis not present

## 2024-10-13 DIAGNOSIS — F1290 Cannabis use, unspecified, uncomplicated: Secondary | ICD-10-CM

## 2024-10-13 DIAGNOSIS — R109 Unspecified abdominal pain: Secondary | ICD-10-CM

## 2024-10-13 DIAGNOSIS — G479 Sleep disorder, unspecified: Secondary | ICD-10-CM | POA: Diagnosis not present

## 2024-10-13 DIAGNOSIS — F419 Anxiety disorder, unspecified: Secondary | ICD-10-CM

## 2024-10-13 LAB — LIPID PANEL

## 2024-10-13 LAB — BAYER DCA HB A1C WAIVED: HB A1C (BAYER DCA - WAIVED): 10.5 % — ABNORMAL HIGH (ref 4.8–5.6)

## 2024-10-13 MED ORDER — BLOOD GLUCOSE MONITORING SUPPL DEVI: 1.0000 | 0 refills | Status: AC

## 2024-10-13 MED ORDER — TRAZODONE HCL 50 MG PO TABS: 25.0000 mg | ORAL_TABLET | Freq: Every evening | ORAL | 0 refills | Status: AC | PRN

## 2024-10-13 MED ORDER — LANTUS SOLOSTAR 100 UNIT/ML ~~LOC~~ SOPN: 10.0000 [IU] | PEN_INJECTOR | Freq: Every day | SUBCUTANEOUS | 99 refills | Status: AC

## 2024-10-13 MED ORDER — ESCITALOPRAM OXALATE 10 MG PO TABS: 10.0000 mg | ORAL_TABLET | Freq: Every day | ORAL | 0 refills | Status: DC

## 2024-10-13 MED ORDER — LANCET DEVICE MISC: 1.0000 | 0 refills | Status: AC

## 2024-10-13 MED ORDER — BLOOD GLUCOSE TEST VI STRP: 1.0000 | ORAL_STRIP | 0 refills | Status: AC

## 2024-10-13 MED ORDER — INSULIN LISPRO (1 UNIT DIAL) 100 UNIT/ML (KWIKPEN): PEN_INJECTOR | SUBCUTANEOUS | 11 refills | Status: AC

## 2024-10-13 MED ORDER — LANCETS MISC: 1.0000 | 0 refills | Status: AC

## 2024-10-13 NOTE — Telephone Encounter (Signed)
 Patient requesting insulin  needles.

## 2024-10-13 NOTE — Telephone Encounter (Signed)
 PCP would like patient to be scheduled with Evelyn [name], within the next week or so, for DM management.   Please call patient to schedule appointment.

## 2024-10-13 NOTE — Telephone Encounter (Unsigned)
 Copied from CRM #[id number]. Topic: Clinical - Medication Refill >> Oct 13, 2024  3:50 PM Jasmin G wrote: Medication: Insulin  needles  Has the patient contacted their pharmacy? Yes (Agent: If no, request that the patient contact the pharmacy for the refill. If patient does not wish to contact the pharmacy document the reason why and proceed with request.) (Agent: If yes, when and what did the pharmacy advise?)  This is the patient's preferred pharmacy:  [id number] - [place], [place] - [address] [place] [address] Phone: [phone] Fax: [phone]  Is this the correct pharmacy for this prescription? Yes If no, delete pharmacy and type the correct one.   Has the prescription been filled recently? Yes  Is the patient out of the medication? Yes  Has the patient been seen for an appointment in the last year OR does the patient have an upcoming appointment? No  Can we respond through MyChart? No  Agent: Please be advised that Rx refills may take up to 3 business days. We ask that you follow-up with your pharmacy.

## 2024-10-13 NOTE — Patient Instructions (Signed)
 I've explained to her that drugs of the SSRI class can have side effects such as weight gain, sexual dysfunction, insomnia, headache, nausea. These medications are generally effective at alleviating symptoms of anxiety and/or depression. Let me know if significant side effects do occur.  If your symptoms worsen or you have thoughts of suicide/homicide, PLEASE SEEK IMMEDIATE MEDICAL ATTENTION.  You may always call the National Suicide Hotline.  This is available 24 hours a day, 7 days a week.  Their number is: (347) 888-3369  Taking the medicine as directed and not missing any doses is one of the best things you can do to treat your depression.  Here are some things to keep in mind:  Side effects (stomach upset, some increased anxiety) may happen before you notice a benefit.  These side effects typically go away over time. Changes to your dose of medicine or a change in medication all together is sometimes necessary Most people need to be on medication at least 6 months Many people will notice an improvement within two weeks but the full effect of the medication can take up to 4-6 weeks Stopping the medication when you start feeling better often results in a return of symptoms Never discontinue your medication without contacting a health care professional first.  Some medications require gradual discontinuation/ taper and can make you sick if you stop them abruptly.  If your symptoms worsen or you have thoughts of suicide/homicide, PLEASE SEEK IMMEDIATE MEDICAL ATTENTION.  You may always call:  National Suicide Hotline: 954-099-4286 [place] Crisis Line: 670-539-7474 Crisis Recovery in [place]: (854)861-7991  These are available 24 hours a day, 7 days a week.

## 2024-10-13 NOTE — Telephone Encounter (Signed)
 Already called in by pcp. LS

## 2024-10-13 NOTE — Progress Notes (Addendum)
 "  New Patient Office Visit  Patient ID: Evelyn [name], Female   DOB: 1986-01-01 38 y.o. MRN: [id number]  Chief Complaint  Patient presents with   Establish Care   Diabetes Management Plan    Patient recently had an ED visit and blood sugar was uncontrolled. Was advised to get established with a PCP to get her sugar under control.   Insomnia    Patient reports that she doesn't sleep well. Does use Marijuana occasionally to help her relax/sleep.    Subjective:     Evelyn [name] presents to establish care  Insomnia    Discussed the use of AI scribe software for clinical note transcription with the patient, who gave verbal consent to proceed.  History of Present Illness   Evelyn [name] is a 38 year old female here today to establish care.  Hx of diabetes  Hyperglycemia and diabetes management - Diabetes initially diagnosed during pregnancy in 2009 at at 21 per patient.  - Patient reports that she used insulin  for many years and was told that she was type 1 diabetic.  - Management has been inconsistent this year. She states that [facility] ran some test and said that she was not type 1 diabetic and put her on oral medicines. Patient feels that oral meds made her sick and stopped taking them along with her insulin .  - Reports using humalog  and lantus  in the past.  - Last recorded hemoglobin A1c was 10.9% on September 03, 2024 per patient.  - Recent emergency department visit on October 10, 2024, for abdominal pain and hyperglycemia, glucose was in the 300s.  - Abd CT showed potential gastroparesis.  - Discharged from ED, notes state prescriptions for Lantus  and Humalog . Patient reports that she was unaware that these meds were prescribed.  - Blood glucose readings 200 this am.  - Expresses frustration with inconsistent medical advice and management in the past.   Gastrointestinal symptoms - Intermittent nausea and diarrhea since ED discharge.    Sleep disturbance and  anxiety - Difficulty sleeping - High levels of anxiety - Uses marijuana to help sleep but has since stopped after hospital discharge.         10/13/2024   10:43 AM 10/01/2022    2:40 PM  GAD 7 : Generalized Anxiety Score  Nervous, Anxious, on Edge 3 1  Control/stop worrying 3 1  Worry too much - different things 3 1  Trouble relaxing 3 1  Restless 1 0  Easily annoyed or irritable 3 1  Afraid - awful might happen 0 1  Total GAD 7 Score 16 6  Anxiety Difficulty Extremely difficult        Outpatient Encounter Medications as of 10/13/2024  Medication Sig   Blood Glucose Monitoring Suppl DEVI 1 each by Does not apply route as directed. Dispense based on patient and insurance preference. Use up to four times daily as directed. (FOR ICD-10 E10.9, E11.9).   escitalopram  (LEXAPRO ) 10 MG tablet Take 1 tablet (10 mg total) by mouth daily.   Glucose Blood (BLOOD GLUCOSE TEST STRIPS) STRP 1 each by Does not apply route as directed. Dispense based on patient and insurance preference. Use up to four times daily as directed. (FOR ICD-10 E10.9, E11.9).   insulin  glargine (LANTUS  SOLOSTAR) 100 UNIT/ML Solostar Pen Inject 10 Units into the skin at bedtime.   insulin  lispro (HUMALOG  KWIKPEN) 100 UNIT/ML KwikPen Blood sugar < 150: 0 units. 150-199: 2 units. 200-249: 4 units. 250-299: 6  units. 300-349: 8 units. >/= 350: 10  units   Lancet Device MISC 1 each by Does not apply route as directed. Dispense based on patient and insurance preference. Use up to four times daily as directed. (FOR ICD-10 E10.9, E11.9).   Lancets MISC 1 each by Does not apply route as directed. Dispense based on patient and insurance preference. Use up to four times daily as directed. (FOR ICD-10 E10.9, E11.9).   ondansetron  (ZOFRAN -ODT) 4 MG disintegrating tablet Take 4 mg by mouth.   traZODone  (DESYREL ) 50 MG tablet Take 0.5-1 tablets (25-50 mg total) by mouth at bedtime as needed for sleep.   [DISCONTINUED] hydrOXYzine  (ATARAX) 25 MG tablet Take 25 mg by mouth 3 (three) times daily.   [DISCONTINUED] chlordiazePOXIDE (LIBRIUM) 10 MG capsule Take 10 mg by mouth 2 (two) times daily as needed.   [DISCONTINUED] FARXIGA 10 MG TABS tablet Take 10 mg by mouth daily.   [DISCONTINUED] HYDROcodone -acetaminophen  (NORCO/VICODIN) 5-325 MG tablet Take 1 tablet by mouth every 4 (four) hours as needed for severe pain. (Patient not taking: Reported on 07/08/2023)   [DISCONTINUED] ibuprofen  (ADVIL ) 200 MG tablet Take 200 mg by mouth every 6 (six) hours as needed for headache.   [DISCONTINUED] insulin  glargine (LANTUS ) 100 UNIT/ML injection Inject 0.2 mLs (20 Units total) into the skin 2 (two) times daily. (Patient not taking: Reported on 07/08/2023)   [DISCONTINUED] insulin  lispro (HUMALOG  KWIKPEN) 100 UNIT/ML KwikPen Inject 0-15 Units into the skin 3 (three) times daily. CBG 70 - 120: 0 units CBG 121 - 150: 2 units CBG 151 - 200: 3 units CBG 201 - 250: 5 units CBG 251 - 300: 8 units CBG 301 - 350: 11 units CBG 351 - 400: 15 units (Patient not taking: Reported on 07/08/2023)   No facility-administered encounter medications on file as of 10/13/2024.    Past Medical History:  Diagnosis Date   Diabetes mellitus without complication (HCC)    Diagnosed in 2009; on insulin    Heartburn in pregnancy    History of trichomoniasis    Infection    UTI   Migraine    otc med prn    Past Surgical History:  Procedure Laterality Date   CESAREAN SECTION  2009   CESAREAN SECTION N/A 08/04/2013   Procedure: REPEAT CESAREAN SECTION;  Surgeon: Nathanel LELON Bunker, MD;  Location: [facility];  Service: Obstetrics;  Laterality: N/A;   DENTAL SURGERY     wisdom teeth ext    Family History  Problem Relation Age of Onset   Cancer Paternal Grandmother    Cancer Maternal Grandmother    Alzheimer's disease Maternal Grandfather    Diabetes Father    Diabetes Mother    Hypertension Mother    Birth defects Sister        heart was on wrong side     Social History   Socioeconomic History   Marital status: Married    Spouse name: Not on file   Number of children: Not on file   Years of education: Not on file   Highest education level: Not on file  Occupational History   Not on file  Tobacco Use   Smoking status: Former    Current packs/day: 0.00    Average packs/day: 0.5 packs/day for 6.0 years (3.0 ttl pk-yrs)    Types: Cigarettes    Start date: 10/22/2006    Quit date: 10/22/2012    Years since quitting: 11.9    Passive exposure: Past   Smokeless tobacco:  Never   Tobacco comments:    07/08/2023 Patient states she smokes when she drinks and its not often  Vaping Use   Vaping status: Never Used  Substance and Sexual Activity   Alcohol use: Yes    Comment: Occasional   Drug use: Yes    Types: Marijuana   Sexual activity: Yes    Birth control/protection: None  Other Topics Concern   Not on file  Social History Narrative   Not on file   Social Drivers of Health   Tobacco Use: Medium Risk (10/13/2024)   Patient History    Smoking Tobacco Use: Former    Smokeless Tobacco Use: Never    Passive Exposure: Past  Physicist, Medical Strain: Low Risk (10/01/2022)   Overall Financial Resource Strain (CARDIA)    Difficulty of Paying Living Expenses: Not hard at all  Food Insecurity: No Food Insecurity (10/01/2022)   Hunger Vital Sign    Worried About Running Out of Food in the Last Year: Never true    Ran Out of Food in the Last Year: Never true  Transportation Needs: No Transportation Needs (10/01/2022)   PRAPARE - Administrator, Civil Service (Medical): No    Lack of Transportation (Non-Medical): No  Physical Activity: Inactive (10/01/2022)   Exercise Vital Sign    Days of Exercise per Week: 0 days    Minutes of Exercise per Session: 0 min  Stress: Stress Concern Present (10/01/2022)   [employer] of Occupational Health - Occupational Stress Questionnaire    Feeling of Stress : To some extent   Social Connections: Socially Integrated (10/01/2022)   Social Connection and Isolation Panel    Frequency of Communication with Friends and Family: More than three times a week    Frequency of Social Gatherings with Friends and Family: More than three times a week    Attends Religious Services: 1 to 4 times per year    Active Member of Clubs or Organizations: Yes    Attends Banker Meetings: 1 to 4 times per year    Marital Status: Married  Catering Manager Violence: Not At Risk (10/01/2022)   Humiliation, Afraid, Rape, and Kick questionnaire    Fear of Current or Ex-Partner: No    Emotionally Abused: No    Physically Abused: No    Sexually Abused: No  Depression (PHQ2-9): Medium Risk (10/13/2024)   Depression (PHQ2-9)    PHQ-2 Score: 10  Alcohol Screen: Low Risk (10/01/2022)   Alcohol Screen    Last Alcohol Screening Score (AUDIT): 0  Housing: Low Risk (10/01/2022)   Housing    Last Housing Risk Score: 0  Utilities: Not At Risk (10/01/2022)   AHC Utilities    Threatened with loss of utilities: No  Health Literacy: Not on file    Review of Systems  Psychiatric/Behavioral:  The patient has insomnia.       Objective:    BP 103/71   Pulse 98   Temp 97.6 F (36.4 C)   Ht 5' 3 (1.6 m)   Wt 131 lb 9.6 oz (59.7 kg)   SpO2 100%   BMI 23.31 kg/m   Physical Exam Vitals reviewed.  Constitutional:      Appearance: Normal appearance.  HENT:     Head: Normocephalic and atraumatic.  Eyes:     Extraocular Movements: Extraocular movements intact.     Conjunctiva/sclera: Conjunctivae normal.     Pupils: Pupils are equal, round, and reactive to light.  Cardiovascular:  Rate and Rhythm: Normal rate and regular rhythm.     Pulses: Normal pulses.     Heart sounds: Normal heart sounds. No murmur heard. Pulmonary:     Effort: Pulmonary effort is normal. No respiratory distress.     Breath sounds: Normal breath sounds.  Musculoskeletal:        General: No  deformity. Normal range of motion.     Cervical back: Normal range of motion.  Skin:    General: Skin is warm and dry.  Neurological:     General: No focal deficit present.     Mental Status: She is alert and oriented to person, place, and time.  Psychiatric:        Mood and Affect: Mood normal.        Behavior: Behavior normal.          Assessment & Plan:   Problem List Items Addressed This Visit   None Visit Diagnoses       Uncontrolled other specified diabetes mellitus with hyperglycemia (HCC)    -  Primary   Relevant Medications   insulin  glargine (LANTUS  SOLOSTAR) 100 UNIT/ML Solostar Pen   insulin  lispro (HUMALOG  KWIKPEN) 100 UNIT/ML KwikPen   Other Relevant Orders   Ambulatory referral to Endocrinology     History of uncontrolled diabetes       Relevant Medications   Blood Glucose Monitoring Suppl DEVI   Glucose Blood (BLOOD GLUCOSE TEST STRIPS) STRP   Lancet Device MISC   Lancets MISC   Other Relevant Orders   CBC with Differential   Comprehensive metabolic panel with GFR   Bayer DCA Hb A1c Waived   Lipid Panel     Anxiety       Relevant Medications   escitalopram  (LEXAPRO ) 10 MG tablet   traZODone  (DESYREL ) 50 MG tablet   Other Relevant Orders   TSH   T4, Free     Difficulty sleeping       Relevant Medications   traZODone  (DESYREL ) 50 MG tablet     Abdominal pain, unspecified abdominal location         Nausea         Marijuana use           Assessment and Plan    Diabetes mellitus with hyperglycemia and gastroparesis Unsure if type 1 or 2? - Referred to endocrinology for further evaluation and management. - Ordered lab work including, results pending.  - Prescribed Lantus  and Humalog  for insulin  therapy. - Discussed sliding scale insulin . Patient voiced understanding of insulin  use and reports that she used lantus  and humalog  for a long time in the past.  - Placed libre in office. Instructed on how to use.  - F/u 1 week with [facility] pharmacist.     Anxiety disorder - Hydroxyzine ineffective. - Discussed potential benefits and side effects of SSRI therapy. Patient voiced support for starting medication.  - Prescribed daily lexapro  10 mg/day for anxiety management. - F/u 2 weeks for reevaluation of anxiety and lexapro .   Sleep disorder - Prescribed trazodone  to trial for sleep, with a plan to wean off as anxiety medication takes effect.  - Patient was using marijuana to help with sleep but has stopped since being discharged from the hospital. We discussed the side effect of marijuana use and I recommended continued cessation of its use.   Return in about 2 weeks (around 10/27/2024).   Oneil LELON Severin, FNP [facility]     "

## 2024-10-14 ENCOUNTER — Other Ambulatory Visit: Payer: Self-pay

## 2024-10-14 ENCOUNTER — Ambulatory Visit: Payer: Self-pay | Admitting: Family Medicine

## 2024-10-14 LAB — CBC WITH DIFFERENTIAL/PLATELET
Basophils Absolute: 0 x10E3/uL (ref 0.0–0.2)
Basos: 1 %
EOS (ABSOLUTE): 0 x10E3/uL (ref 0.0–0.4)
Eos: 0 %
Hematocrit: 42 % (ref 34.0–46.6)
Hemoglobin: 13.2 g/dL (ref 11.1–15.9)
Immature Grans (Abs): 0 x10E3/uL (ref 0.0–0.1)
Immature Granulocytes: 0 %
Lymphocytes Absolute: 2.1 x10E3/uL (ref 0.7–3.1)
Lymphs: 34 %
MCH: 28 pg (ref 26.6–33.0)
MCHC: 31.4 g/dL — ABNORMAL LOW (ref 31.5–35.7)
MCV: 89 fL (ref 79–97)
Monocytes Absolute: 0.4 x10E3/uL (ref 0.1–0.9)
Monocytes: 7 %
Neutrophils Absolute: 3.6 x10E3/uL (ref 1.4–7.0)
Neutrophils: 58 %
Platelets: 426 x10E3/uL (ref 150–450)
RBC: 4.71 x10E6/uL (ref 3.77–5.28)
RDW: 13.5 % (ref 11.7–15.4)
WBC: 6.1 x10E3/uL (ref 3.4–10.8)

## 2024-10-14 LAB — COMPREHENSIVE METABOLIC PANEL WITH GFR
ALT: 13 IU/L (ref 0–32)
AST: 15 IU/L (ref 0–40)
Albumin: 4.5 g/dL (ref 3.9–4.9)
Alkaline Phosphatase: 66 IU/L (ref 41–116)
BUN/Creatinine Ratio: 24 — ABNORMAL HIGH (ref 9–23)
BUN: 12 mg/dL (ref 6–20)
Bilirubin Total: 0.9 mg/dL (ref 0.0–1.2)
CO2: 23 mmol/L (ref 20–29)
Calcium: 9.5 mg/dL (ref 8.7–10.2)
Chloride: 100 mmol/L (ref 96–106)
Creatinine, Ser: 0.49 mg/dL — ABNORMAL LOW (ref 0.57–1.00)
Globulin, Total: 2.4 g/dL (ref 1.5–4.5)
Glucose: 214 mg/dL — ABNORMAL HIGH (ref 70–99)
Potassium: 4.2 mmol/L (ref 3.5–5.2)
Sodium: 138 mmol/L (ref 134–144)
Total Protein: 6.9 g/dL (ref 6.0–8.5)
eGFR: 125 mL/min/1.73

## 2024-10-14 LAB — LIPID PANEL
Chol/HDL Ratio: 4.3 ratio (ref 0.0–4.4)
Cholesterol, Total: 180 mg/dL (ref 100–199)
HDL: 42 mg/dL
LDL Chol Calc (NIH): 117 mg/dL — ABNORMAL HIGH (ref 0–99)
Triglycerides: 115 mg/dL (ref 0–149)
VLDL Cholesterol Cal: 21 mg/dL (ref 5–40)

## 2024-10-14 LAB — T4, FREE: Free T4: 1.53 ng/dL (ref 0.82–1.77)

## 2024-10-14 LAB — TSH: TSH: 0.618 u[IU]/mL (ref 0.450–4.500)

## 2024-10-14 MED ORDER — PEN NEEDLES 32G X 4 MM MISC: 1.0000 | Freq: Every evening | 1 refills | Status: AC

## 2024-10-20 ENCOUNTER — Telehealth: Payer: Self-pay | Admitting: Family Medicine

## 2024-10-20 ENCOUNTER — Other Ambulatory Visit (HOSPITAL_COMMUNITY)
Admission: RE | Admit: 2024-10-20 | Discharge: 2024-10-20 | Disposition: A | Discharge status: Home / Self Care | Source: Ambulatory Visit | Attending: Adult Health | Admitting: Adult Health

## 2024-10-20 ENCOUNTER — Ambulatory Visit: Admitting: Adult Health

## 2024-10-20 ENCOUNTER — Encounter: Payer: Self-pay | Admitting: Adult Health

## 2024-10-20 VITALS — BP 136/88 | HR 89 | Ht 63.0 in | Wt 135.0 lb

## 2024-10-20 DIAGNOSIS — R030 Elevated blood-pressure reading, without diagnosis of hypertension: Secondary | ICD-10-CM | POA: Diagnosis not present

## 2024-10-20 DIAGNOSIS — Z319 Encounter for procreative management, unspecified: Secondary | ICD-10-CM

## 2024-10-20 DIAGNOSIS — Z3169 Encounter for other general counseling and advice on procreation: Secondary | ICD-10-CM

## 2024-10-20 DIAGNOSIS — N898 Other specified noninflammatory disorders of vagina: Secondary | ICD-10-CM

## 2024-10-20 DIAGNOSIS — N925 Other specified irregular menstruation: Secondary | ICD-10-CM

## 2024-10-20 DIAGNOSIS — Z3202 Encounter for pregnancy test, result negative: Secondary | ICD-10-CM

## 2024-10-20 DIAGNOSIS — Z124 Encounter for screening for malignant neoplasm of cervix: Secondary | ICD-10-CM | POA: Insufficient documentation

## 2024-10-20 DIAGNOSIS — N921 Excessive and frequent menstruation with irregular cycle: Secondary | ICD-10-CM | POA: Insufficient documentation

## 2024-10-20 LAB — CERVICOVAGINAL ANCILLARY ONLY
Bacterial Vaginitis (gardnerella): POSITIVE — AB
Candida Glabrata: NEGATIVE
Candida Vaginitis: NEGATIVE
Comment: NEGATIVE
Comment: NEGATIVE
Comment: NEGATIVE
Comment: NEGATIVE
Trichomonas: NEGATIVE

## 2024-10-20 LAB — POCT URINE PREGNANCY: Preg Test, Ur: NEGATIVE

## 2024-10-20 NOTE — Telephone Encounter (Signed)
 Called and spoke with patient. She has already come by the office and had triage check her BP. Reported normal BP when triage checked it.    Copied from CRM #[id number]. Topic: General - Other >> Oct 20, 2024 12:47 PM Cherylann S wrote: Reason for CRM: Patient called requesting to have a bp check stated will stop by the office to have a recheck of her bp

## 2024-10-20 NOTE — Progress Notes (Signed)
" °  Subjective:     Patient ID: Evelyn [name], female   DOB: May 19, 1986, 38 y.o.   MRN: [id number]  HPI Evelyn [name] is a 38 year old white female, married, G2P2002, in complaining of ? Yeast, has vaginal odor, stomach pain and break through bleeding. She  is on insulin  now, and would like to get pregnant in future. She needs a pap too.  PCP is Oneil Severin FNP  Review of Systems +vaginal odor,  +stomach pain  +break through bleeding Reviewed past medical,surgical, social and family history. Reviewed medications and allergies.     Objective:   Physical Exam BP 136/88 (BP Location: Right Arm, Patient Position: Sitting, Cuff Size: Normal)   Pulse 89   Ht 5' 3 (1.6 m)   Wt 135 lb (61.2 kg)   LMP 10/06/2024 (Approximate)   BMI 23.91 kg/m  UPT is negative. Skin warm and dry.Pelvic: external genitalia is normal in appearance no lesions, vagina: white discharge with odor,urethra has no lesions or masses noted, cervix:smooth and bulbous,pap with GC/CHL and HR HPV genotyping performed, and CV swab obtained, uterus: normal size, shape and contour, non tender, no masses felt, adnexa: no masses or tenderness noted. Bladder is non tender and no masses felt.     Upstream - 10/20/24 1101       Pregnancy Intention Screening   Does the patient want to become pregnant in the next year? Yes    Does the patient's partner want to become pregnant in the next year? Yes    Would the patient like to discuss contraceptive options today? No      Contraception Wrap Up   Current Method Pregnant/Seeking Pregnancy    End Method Pregnant/Seeking Pregnancy    Contraception Counseling Provided No         Examination chaperoned by Clarita Salt LPN  Assessment:     1. Routine Papanicolaou smear Pap sent Pap in 3 years if negative  - Cytology - PAP( [place])  2. Negative pregnancy test - POCT urine pregnancy  3. Vaginal odor (Primary) +Vaginal odor CV swab  sent for trich,BV and yeast  -  Cervicovaginal ancillary only( [place])  4. Vaginal discharge CV swab sent  - Cervicovaginal ancillary only( [place])  5. Elevated BP without diagnosis of hypertension Check BP with PCP next week   6. Irregular intermenstrual bleeding +BTB, could be ovulation related   7. Patient desires pregnancy Take OTC PNV Get A1c below 8    Plan:     Return in 1 year for physical     "

## 2024-10-21 ENCOUNTER — Other Ambulatory Visit: Payer: Self-pay | Admitting: Obstetrics & Gynecology

## 2024-10-21 ENCOUNTER — Other Ambulatory Visit: Payer: Self-pay | Admitting: Women's Health

## 2024-10-21 ENCOUNTER — Telehealth: Payer: Self-pay | Admitting: *Deleted

## 2024-10-21 MED ORDER — METRONIDAZOLE 0.75 % VA GEL: 1.0000 | Freq: Every day | VAGINAL | 0 refills | Status: AC

## 2024-10-21 NOTE — Telephone Encounter (Signed)
 Pt's swab is + for BV. Can you send in med? She saw JAG on 10/20/24. Pt was advised no sex or alcohol while taking med. Thanks! JSY

## 2024-10-21 NOTE — Progress Notes (Signed)
Rx for metrogel  Myna Hidalgo, DO Attending Obstetrician & Gynecologist, [facility] for [employer], [facility]

## 2024-10-23 LAB — CYTOLOGY - PAP
Chlamydia: POSITIVE — AB
Comment: NEGATIVE
Comment: NEGATIVE
Comment: NORMAL
Diagnosis: NEGATIVE
High risk HPV: NEGATIVE
Neisseria Gonorrhea: NEGATIVE

## 2024-10-26 ENCOUNTER — Encounter: Payer: Self-pay | Admitting: Adult Health

## 2024-10-26 ENCOUNTER — Ambulatory Visit: Payer: Self-pay | Admitting: Adult Health

## 2024-10-26 DIAGNOSIS — A749 Chlamydial infection, unspecified: Secondary | ICD-10-CM | POA: Insufficient documentation

## 2024-10-26 MED ORDER — DOXYCYCLINE HYCLATE 100 MG PO TABS: 100.0000 mg | ORAL_TABLET | Freq: Two times a day (BID) | ORAL | 0 refills | Status: DC

## 2024-10-27 ENCOUNTER — Ambulatory Visit: Admitting: Family Medicine

## 2024-10-27 ENCOUNTER — Telehealth: Payer: Self-pay | Admitting: *Deleted

## 2024-10-27 NOTE — Telephone Encounter (Signed)
 Pt aware of + CHL. She did get the Doxy and is taking it. I advised no sex until after POC appt in 4 weeks. Pt states she would call back to schedule her POC appt because she was taking a nap. Pt's partner needs to be treated. Pt states she would call us  back with his info for us  to treat. JSY

## 2024-10-28 ENCOUNTER — Ambulatory Visit: Admitting: Family Medicine

## 2024-10-29 ENCOUNTER — Telehealth: Payer: Self-pay | Admitting: Pharmacy Technician

## 2024-10-29 ENCOUNTER — Encounter: Payer: Self-pay | Admitting: Family Medicine

## 2024-10-29 ENCOUNTER — Telehealth: Payer: Self-pay | Admitting: Family Medicine

## 2024-10-29 ENCOUNTER — Other Ambulatory Visit (HOSPITAL_COMMUNITY): Payer: Self-pay

## 2024-10-29 ENCOUNTER — Ambulatory Visit: Admitting: Family Medicine

## 2024-10-29 VITALS — BP 114/77 | HR 92 | Temp 97.6°F | Ht 63.0 in | Wt 135.0 lb

## 2024-10-29 DIAGNOSIS — E7800 Pure hypercholesterolemia, unspecified: Secondary | ICD-10-CM

## 2024-10-29 DIAGNOSIS — F1290 Cannabis use, unspecified, uncomplicated: Secondary | ICD-10-CM

## 2024-10-29 DIAGNOSIS — F419 Anxiety disorder, unspecified: Secondary | ICD-10-CM

## 2024-10-29 DIAGNOSIS — G479 Sleep disorder, unspecified: Secondary | ICD-10-CM | POA: Diagnosis not present

## 2024-10-29 DIAGNOSIS — E1365 Other specified diabetes mellitus with hyperglycemia: Secondary | ICD-10-CM

## 2024-10-29 MED ORDER — ATORVASTATIN CALCIUM 10 MG PO TABS: 10.0000 mg | ORAL_TABLET | Freq: Every day | ORAL | 0 refills | Status: AC

## 2024-10-29 MED ORDER — FREESTYLE LIBRE 3 PLUS SENSOR MISC: 3 refills | Status: AC

## 2024-10-29 NOTE — Progress Notes (Signed)
 "  Established Patient Office Visit  Patient ID: Evelyn [name], female    DOB: 09/17/1986  Age: 39 y.o. MRN: [id number] PCP: Alcus Oneil ORN, FNP  Chief Complaint  Patient presents with   Follow-up    Blood sugar follow up    Subjective:     HPI  Discussed the use of AI scribe software for clinical note transcription with the patient, who gave verbal consent to proceed.  History of Present Illness   Evelyn [name] is a 39 year old female with diabetes who presents with blood sugar management issues.  Patient last seen on 10/13/2024 - Glucose levels had been running over 300 necessitating ED visit.  - Patient was started back on insulin  for diabetes.  Patient reported at that time that previous providers have not been sure if she has type I or type II diabetes. - The patient was also having gastroparesis symptoms but reports that this been improving since she has stopped smoking marijuana. - Patient reports that the herlene has been helping her manage her diabetes better. - Lexapro  was ordered at the last visit for anxiety but the patient has not started yet.   Glycemic control - Elevated blood glucose levels, typically remaining below 225 mg/dL - Episode of hypoglycemia during work with BS 54. - Continuous glucose monitor (Libre) in use, providing alerts for low blood sugar  Insulin  therapy - Administers 5 units of Lantus  in the morning and 5 units at night, which she finds effective - Uses Humalog  based on meal intake, but uncertain about exact dosage requirements.  Patient reports that she has not given more than 5 units of Humalog  at one time.   Gastrointestinal symptoms - History of stomach problems affecting appetite - Appetite has returned and she is eating during the day  Sleep disturbance - Prescribed trazodone  at last visit.  - Patient increased dose to 75 mg without significant improvement in sleep - Previously used marijuana for sleep, but found it ineffective  over time and has since stopped.   General well-being - Improvement in overall health and energy levels, as observed by family and patient.          10/29/2024    9:22 AM 10/13/2024   10:43 AM 10/01/2022    2:40 PM  GAD 7 : Generalized Anxiety Score  Nervous, Anxious, on Edge 2 3 1   Control/stop worrying 2 3 1   Worry too much - different things 2 3 1   Trouble relaxing 2 3 1   Restless 1 1 0  Easily annoyed or irritable 2 3 1   Afraid - awful might happen 2 0 1  Total GAD 7 Score 13 16 6   Anxiety Difficulty Somewhat difficult Extremely difficult          ROS    Objective:     BP 114/77 (Cuff Size: Normal)   Pulse 92   Temp 97.6 F (36.4 C)   Ht 5' 3 (1.6 m)   Wt 135 lb (61.2 kg)   LMP 10/06/2024 (Approximate)   SpO2 100%   BMI 23.91 kg/m    Physical Exam Vitals reviewed.  Constitutional:      Appearance: Normal appearance.  HENT:     Head: Normocephalic and atraumatic.  Eyes:     Extraocular Movements: Extraocular movements intact.     Conjunctiva/sclera: Conjunctivae normal.     Pupils: Pupils are equal, round, and reactive to light.  Cardiovascular:     Rate and Rhythm: Normal rate and regular  rhythm.     Pulses: Normal pulses.     Heart sounds: Normal heart sounds.  Pulmonary:     Effort: Pulmonary effort is normal.     Breath sounds: Normal breath sounds.  Musculoskeletal:        General: No deformity. Normal range of motion.     Cervical back: Normal range of motion.  Skin:    General: Skin is warm and dry.  Neurological:     General: No focal deficit present.     Mental Status: She is alert and oriented to person, place, and time.  Psychiatric:        Mood and Affect: Mood normal.        Behavior: Behavior normal.      No results found for any visits on 10/29/24.    The ASCVD Risk score (Arnett DK, et al., 2019) failed to calculate for the following reasons:   The 2019 ASCVD risk score is only valid for ages 57 to 29     Assessment & Plan:   Problem List Items Addressed This Visit   None Visit Diagnoses       Uncontrolled other specified diabetes mellitus with hyperglycemia (HCC)    -  Primary   Relevant Medications   Continuous Glucose Sensor (FREESTYLE LIBRE 3 PLUS SENSOR) MISC   atorvastatin  (LIPITOR) 10 MG tablet     Anxiety         Difficulty sleeping         Marijuana use         Elevated LDL cholesterol level       Relevant Medications   atorvastatin  (LIPITOR) 10 MG tablet       Assessment and Plan    Diabetes - Glucose levels have improved since starting insulin  back with the majority of levels being less than 200-225.  The patient has had a hypoglycemic event with glucose level of 54 and has had spikes of glucose over 200-225 but not over 300. Due to hypoglycemic event will plan to keep current insulin  regimen with no increases.  - Continue Lantus  5 units AM and PM. - Continue Humalog  with meals, adjust per sliding scale. - Discussed sliding scale with the patient as there seemed to be some confusion with the patient on how to properly dose sliding scale. - Prescribed Libre for continuous glucose monitoring. - Discussed importance of eating on a regular meal schedule to help prevent glycemic events.  - Patient is to be scheduled with [facility] our pharmacist to assist with diabetes management.  - Endocrinology referral has been placed to better determine what diabetes the patient has.   LDL 117 on last check - Discussed recommendation to start statin therapy due to elevated LDL in conjunction with diabetes. - Discussed potential side effects of statins. - Patient voiced support for starting statin - Lipitor 10 mg/day ordered  Anxiety - Start lexapro  10 mg/day previously ordered.  - Discussed potential side effects of SSRIs including SI. - Follow-up in 2 weeks for reevaluation or sooner for any concerns or side effects.  Insomnia - Discussed that anxiety can cause sleep initial  problems. Start lexapro . - Can increase trazodone  to 100 mg before bedtime.   Stomach problems - Appetite and stomach problems have been improving since the patient stopped smoke marijuana.  Discussed importance of continuing with marijuana cessation.  General well being - Patient reports that she feels better overall since her blood sugars have been getting under better control.  Return in about 2 weeks (around 11/12/2024).    Oneil LELON Severin, FNP [facility]   "

## 2024-10-29 NOTE — Telephone Encounter (Signed)
 Pharmacy Patient Advocate Encounter   Received notification from Pt Calls Messages that prior authorization for FreeStyle Libre 3 Plus Sensor is required/requested.   Insurance verification completed.   The patient is insured through [facility] MEDICAID.   Per test claim: PA required; PA started via CoverMyMeds. KEY [id number] . Waiting for clinical questions to populate.

## 2024-10-29 NOTE — Telephone Encounter (Signed)
 Pharmacy Patient Advocate Encounter  Received notification from [facility] MEDICAID that Prior Authorization for FreeStyle Libre 3 Plus Sensor has been APPROVED from 10/29/24 to 10/29/25. Ran test claim, Copay is $0.00. This test claim was processed through [facility]- copay amounts may vary at other pharmacies due to pharmacy/plan contracts, or as the patient moves through the different stages of their insurance plan.   PA #/Case ID/Reference #: [id number]

## 2024-10-29 NOTE — Progress Notes (Deleted)
" °    10/29/2024    9:22 AM 10/13/2024   10:43 AM 10/01/2022    2:40 PM  GAD 7 : Generalized Anxiety Score  Nervous, Anxious, on Edge 2 3 1   Control/stop worrying 2 3 1   Worry too much - different things 2 3 1   Trouble relaxing 2 3 1   Restless 1 1 0  Easily annoyed or irritable 2 3 1   Afraid - awful might happen 2 0 1  Total GAD 7 Score 13 16 6   Anxiety Difficulty Somewhat difficult Extremely difficult      "

## 2024-10-29 NOTE — Telephone Encounter (Signed)
 PA request has been Started. New Encounter has been or will be created for follow up. For additional info see Pharmacy Prior Auth telephone encounter from 10/29/24.

## 2024-10-29 NOTE — Telephone Encounter (Signed)
 Patient called stating that she went to pick up her [facility] Rx at [facility] and was told that they are needing something from us .  I called [facility] and spoke with Fairy who confirmed that the [facility] is requiring Prior Authorization.      Continuous Glucose Sensor (FREESTYLE LIBRE 3 PLUS SENSOR) MISC               Will send to our PA Team.

## 2024-10-29 NOTE — Telephone Encounter (Signed)
 Noted

## 2024-11-06 ENCOUNTER — Encounter: Payer: Self-pay | Admitting: Pharmacist

## 2024-11-06 ENCOUNTER — Other Ambulatory Visit

## 2024-11-06 DIAGNOSIS — E119 Type 2 diabetes mellitus without complications: Secondary | ICD-10-CM

## 2024-11-06 NOTE — Progress Notes (Unsigned)
" ° °  11/06/2024 Name: Evelyn [name] MRN: [id number] DOB: [birth year]  Chief Complaint  Patient presents with   Diabetes    Evelyn [name] is a 39 y.o. year old female who presented for a telephone visit.   They were referred to the pharmacist by their PCP for assistance in managing diabetes.   Subjective:  Care Team: Primary Care Provider: Alcus Oneil ORN, FNP ; Next Scheduled Visit: 01/27/25 {careteamprovider:27366}  Medication Access/Adherence  Current Pharmacy:  [id number] - [place], [place] - [address] [place] [address] Phone: [phone] Fax: [phone]   Patient reports affordability concerns with their medications: No  Patient reports access/transportation concerns to their pharmacy: No  Patient reports adherence concerns with their medications:  No     Diabetes:  Current medications: *** Medications tried in the past: ***  Current glucose readings: ***  Patient {Actions; denies-reports:120008} hypoglycemic s/sx including ***dizziness, shakiness, sweating. Patient {Actions; denies-reports:120008} hyperglycemic symptoms including ***polyuria, polydipsia, polyphagia, nocturia, neuropathy, blurred vision.  Current meal patterns:  Cut sweets Sugar free candy Cut tea - Breakfast: *** - Lunch *** - Supper *** - Snacks *** - Drinks ***  Current physical activity: ***  Current medication access support: ***  Macrovascular and Microvascular Risk Reduction:  Statin? {DM Statin Pharmacy:33491}; ACEi/ARB? {DM ACEi/ARB pharmacy:33492} Last urinary albumin/creatinine ratio:   Last eye exam:   Last foot exam: No foot exam found Tobacco Use:  Tobacco Use: Medium Risk (10/29/2024)   Patient History    Smoking Tobacco Use: Former    Smokeless Tobacco Use: Never    Passive Exposure: Past     Objective:  Lab Results  Component Value Date   HGBA1C 10.5 (H) 10/13/2024    Lab Results  Component Value Date   CREATININE 0.49 (L)  10/13/2024   BUN 12 10/13/2024   NA 138 10/13/2024   K 4.2 10/13/2024   CL 100 10/13/2024   CO2 23 10/13/2024    Lab Results  Component Value Date   CHOL 180 10/13/2024   HDL 42 10/13/2024   LDLCALC 117 (H) 10/13/2024   TRIG 115 10/13/2024   CHOLHDL 4.3 10/13/2024    Medications Reviewed Today   Medications were not reviewed in this encounter       Assessment/Plan:   {Pharmacy A/P Choices:26421}  Follow Up Plan: ***  ***  "

## 2024-11-10 ENCOUNTER — Ambulatory Visit: Admitting: Nurse Practitioner

## 2025-01-27 ENCOUNTER — Ambulatory Visit: Admitting: Family Medicine
